# Patient Record
Sex: Female | Born: 1965
Health system: Southern US, Community
[De-identification: ages and names within clinical notes are randomized; demographics above are authoritative.]

## PROBLEM LIST (undated history)

## (undated) DIAGNOSIS — M2550 Pain in unspecified joint: Secondary | ICD-10-CM

## (undated) DIAGNOSIS — E063 Autoimmune thyroiditis: Secondary | ICD-10-CM

## (undated) DIAGNOSIS — R112 Nausea with vomiting, unspecified: Secondary | ICD-10-CM

## (undated) DIAGNOSIS — E669 Obesity, unspecified: Secondary | ICD-10-CM

## (undated) DIAGNOSIS — K5900 Constipation, unspecified: Secondary | ICD-10-CM

## (undated) DIAGNOSIS — M069 Rheumatoid arthritis, unspecified: Secondary | ICD-10-CM

## (undated) DIAGNOSIS — K5792 Diverticulitis of intestine, part unspecified, without perforation or abscess without bleeding: Secondary | ICD-10-CM

## (undated) DIAGNOSIS — E559 Vitamin D deficiency, unspecified: Secondary | ICD-10-CM

## (undated) DIAGNOSIS — B009 Herpesviral infection, unspecified: Secondary | ICD-10-CM

## (undated) DIAGNOSIS — K219 Gastro-esophageal reflux disease without esophagitis: Secondary | ICD-10-CM

## (undated) DIAGNOSIS — K859 Acute pancreatitis without necrosis or infection, unspecified: Secondary | ICD-10-CM

## (undated) DIAGNOSIS — R12 Heartburn: Secondary | ICD-10-CM

## (undated) DIAGNOSIS — R0602 Shortness of breath: Secondary | ICD-10-CM

## (undated) DIAGNOSIS — R222 Localized swelling, mass and lump, trunk: Secondary | ICD-10-CM

## (undated) DIAGNOSIS — K2970 Gastritis, unspecified, without bleeding: Secondary | ICD-10-CM

## (undated) DIAGNOSIS — R519 Headache, unspecified: Secondary | ICD-10-CM

## (undated) DIAGNOSIS — E079 Disorder of thyroid, unspecified: Secondary | ICD-10-CM

## (undated) DIAGNOSIS — K869 Disease of pancreas, unspecified: Secondary | ICD-10-CM

## (undated) DIAGNOSIS — Z9889 Other specified postprocedural states: Secondary | ICD-10-CM

## (undated) HISTORY — DX: Autoimmune thyroiditis: E06.3

## (undated) HISTORY — DX: Gastritis, unspecified, without bleeding: K29.70

## (undated) HISTORY — DX: Heartburn: R12

## (undated) HISTORY — DX: Disease of pancreas, unspecified: K86.9

## (undated) HISTORY — DX: Constipation, unspecified: K59.00

## (undated) HISTORY — DX: Disorder of thyroid, unspecified: E07.9

## (undated) HISTORY — DX: Headache, unspecified: R51.9

## (undated) HISTORY — DX: Pain in unspecified joint: M25.50

## (undated) HISTORY — PX: TYMPANOSTOMY TUBE PLACEMENT: SHX32

## (undated) HISTORY — DX: Vitamin D deficiency, unspecified: E55.9

## (undated) HISTORY — DX: Acute pancreatitis without necrosis or infection, unspecified: K85.90

## (undated) HISTORY — PX: COLONOSCOPY: SHX174

## (undated) HISTORY — DX: Gastro-esophageal reflux disease without esophagitis: K21.9

## (undated) HISTORY — DX: Shortness of breath: R06.02

## (undated) HISTORY — DX: Diverticulitis of intestine, part unspecified, without perforation or abscess without bleeding: K57.92

## (undated) HISTORY — PX: POLYPECTOMY: SHX149

## (undated) HISTORY — PX: TONSILLECTOMY AND ADENOIDECTOMY: SHX28

## (undated) HISTORY — DX: Obesity, unspecified: E66.9

---

## 2001-07-20 ENCOUNTER — Other Ambulatory Visit: Admission: RE | Admit: 2001-07-20 | Discharge: 2001-07-20 | Payer: Self-pay | Admitting: Obstetrics and Gynecology

## 2002-08-12 ENCOUNTER — Other Ambulatory Visit: Admission: RE | Admit: 2002-08-12 | Discharge: 2002-08-12 | Payer: Self-pay | Admitting: Obstetrics and Gynecology

## 2002-08-13 ENCOUNTER — Encounter: Payer: Self-pay | Admitting: Emergency Medicine

## 2002-08-13 ENCOUNTER — Emergency Department (HOSPITAL_COMMUNITY): Admission: EM | Admit: 2002-08-13 | Discharge: 2002-08-13 | Payer: Self-pay | Admitting: Emergency Medicine

## 2003-10-11 ENCOUNTER — Other Ambulatory Visit: Admission: RE | Admit: 2003-10-11 | Discharge: 2003-10-11 | Payer: Self-pay | Admitting: Obstetrics and Gynecology

## 2003-12-05 ENCOUNTER — Ambulatory Visit (HOSPITAL_COMMUNITY): Admission: RE | Admit: 2003-12-05 | Discharge: 2003-12-05 | Payer: Self-pay | Admitting: Family Medicine

## 2004-01-25 ENCOUNTER — Ambulatory Visit (HOSPITAL_COMMUNITY): Admission: RE | Admit: 2004-01-25 | Discharge: 2004-01-25 | Payer: Self-pay | Admitting: Family Medicine

## 2004-01-26 ENCOUNTER — Ambulatory Visit (HOSPITAL_COMMUNITY): Admission: RE | Admit: 2004-01-26 | Discharge: 2004-01-26 | Payer: Self-pay | Admitting: Family Medicine

## 2004-01-30 ENCOUNTER — Ambulatory Visit (HOSPITAL_COMMUNITY): Admission: RE | Admit: 2004-01-30 | Discharge: 2004-01-30 | Payer: Self-pay | Admitting: Gastroenterology

## 2004-01-31 ENCOUNTER — Ambulatory Visit (HOSPITAL_COMMUNITY): Admission: RE | Admit: 2004-01-31 | Discharge: 2004-01-31 | Payer: Self-pay | Admitting: Gastroenterology

## 2004-10-31 ENCOUNTER — Other Ambulatory Visit: Admission: RE | Admit: 2004-10-31 | Discharge: 2004-10-31 | Payer: Self-pay | Admitting: Obstetrics and Gynecology

## 2005-11-05 ENCOUNTER — Other Ambulatory Visit: Admission: RE | Admit: 2005-11-05 | Discharge: 2005-11-05 | Payer: Self-pay | Admitting: Obstetrics and Gynecology

## 2008-07-05 ENCOUNTER — Encounter: Admission: RE | Admit: 2008-07-05 | Discharge: 2008-07-05 | Payer: Self-pay | Admitting: Obstetrics and Gynecology

## 2010-08-25 ENCOUNTER — Encounter: Payer: Self-pay | Admitting: Family Medicine

## 2010-12-20 NOTE — Op Note (Signed)
NAME:  Robin Maynard, Robin Maynard                         ACCOUNT NO.:  [id number]   MEDICAL RECORD NO.:  [id number]                   PATIENT TYPE:  AMB   LOCATION:  ENDO                                 FACILITY:  [facility]   PHYSICIAN:  Graylin Shiver, M.D.                DATE OF BIRTH:  [birth year]   DATE OF PROCEDURE:  01/30/2004  DATE OF DISCHARGE:                                 OPERATIVE REPORT   PROCEDURE:  Esophagogastroduodenoscopy with biopsy for CLO test.   INDICATIONS FOR PROCEDURE:  Upper abdominal pain.   CONSENT:  Informed consent was obtained after explanation of the risks of  bleeding, infection, and perforation.   PREMEDICATION:  Fentanyl 62.5 mcg intravenously, Versed 5 mg intravenously.   DESCRIPTION OF PROCEDURE:  With the patient in the left lateral decubitus  position, the Olympus gastroscope was inserted into the oropharynx and  passed into the esophagus.  It was advanced down the esophagus then into the  stomach and duodenum.  The second portion involved in the duodenum was  normal.  The stomach showed a diffuse erythematous appearance to the mucosa  consistent with gastritis.  No ulcers or erosions were seen.  No lesions  were seen in the fundus or cardia.  Biopsies were obtained from the distal  stomach for CLO test.  The esophagus looked normal.  The esophagogastric  junction was at 35 cm.  She tolerated the procedure well without  complications.   IMPRESSION:  Gastritis.   PLAN:  The CLO test will be checked.                                               Graylin Shiver, M.D.    Germain Osgood  D:  01/30/2004  T:  01/30/2004  Job:  [id number]   cc:   Meredith Staggers, M.D.  [address], [address]  [place]  [place] [address]  Fax: [phone]

## 2013-08-11 ENCOUNTER — Encounter (INDEPENDENT_AMBULATORY_CARE_PROVIDER_SITE_OTHER): Payer: Self-pay

## 2013-08-11 ENCOUNTER — Encounter (INDEPENDENT_AMBULATORY_CARE_PROVIDER_SITE_OTHER): Payer: Self-pay | Admitting: General Surgery

## 2013-08-11 ENCOUNTER — Ambulatory Visit (INDEPENDENT_AMBULATORY_CARE_PROVIDER_SITE_OTHER): Payer: Commercial Managed Care - PPO | Admitting: General Surgery

## 2013-08-11 VITALS — BP 112/80 | Temp 97.4°F | Ht 64.0 in | Wt 176.6 lb

## 2013-08-11 DIAGNOSIS — R229 Localized swelling, mass and lump, unspecified: Secondary | ICD-10-CM

## 2013-08-11 DIAGNOSIS — R2241 Localized swelling, mass and lump, right lower limb: Secondary | ICD-10-CM | POA: Insufficient documentation

## 2013-08-11 DIAGNOSIS — R222 Localized swelling, mass and lump, trunk: Secondary | ICD-10-CM | POA: Insufficient documentation

## 2013-08-11 NOTE — Progress Notes (Signed)
Patient ID: Robin Maynard, female   DOB: 08-Nov-1965, 48 y.o.   MRN: [id number]  No chief complaint on file.   HPI Robin Maynard is a 48 y.o. female.   HPI  She is self-referred.  She noticed a soft tissue lump in her right lower leg as well as on her right back. Recently, the one in the right back swelled up and was slightly red but it has significantly gone down now. She is not aware of any specific trauma to the areas. The areas are currently asymptomatic.  No past medical history on file.  No past surgical history on file.  Family History  Problem Relation Age of Onset  . Gastric cancer Father     Social History History  Substance Use Topics  . Smoking status: Never Smoker   . Smokeless tobacco: Not on file  . Alcohol Use: No    Allergies no known allergies  Current Outpatient Prescriptions  Medication Sig Dispense Refill  . esomeprazole (NEXIUM) 20 MG capsule Take 20 mg by mouth daily at 12 noon.       No current facility-administered medications for this visit.    Review of Systems Review of Systems  Constitutional: Negative.   Neurological: Positive for headaches.    Blood pressure 112/80, temperature 97.4 F (36.3 C), height 5\' 4"  (1.626 m), weight 176 lb 9.6 oz (80.105 kg).  Physical Exam Physical Exam  Constitutional: She appears well-developed and well-nourished. No distress.  Musculoskeletal:  Right leg-8 mm subcutaneous smooth mobile mass in the distal pretibial area. No erythema.  No moles on the foot or pretibial area.  Back-8 mm palpable subcutaneous mass that is mobile. No erythema.    Data Reviewed none  Assessment    Small subcutaneous masses on the right leg and back. The most likely to clinical diagnosis would be epidermoid cyst. I did not see an indication to remove them at this time.     Plan    If the areas become symptomatic or larger, I asked her to call us back and we could discuss removal.        Lima Chillemi  J 08/11/2013, 4:24 PM

## 2013-08-11 NOTE — Patient Instructions (Signed)
The lump on your right lower leg and right back measure approximately 8 mm in size.  If these become larger or become painful, please let us know and we can discuss removal.

## 2014-11-15 ENCOUNTER — Ambulatory Visit (HOSPITAL_COMMUNITY)
Admission: RE | Admit: 2014-11-15 | Discharge: 2014-11-15 | Disposition: A | Discharge status: Home / Self Care | Payer: 59 | Source: Ambulatory Visit | Attending: Vascular Surgery | Admitting: Vascular Surgery

## 2014-11-15 ENCOUNTER — Other Ambulatory Visit: Payer: Self-pay | Admitting: Vascular Surgery

## 2014-11-15 DIAGNOSIS — M7989 Other specified soft tissue disorders: Secondary | ICD-10-CM

## 2014-11-15 NOTE — Progress Notes (Signed)
*  Preliminary Results* Right lower extremity venous duplex completed. Right lower extremity is negative for deep vein thrombosis. There is no evidence of right Baker's cyst.  11/15/2014 3:06 PM  Maudry Mayhew, RVT, RDCS, RDMS

## 2014-11-23 ENCOUNTER — Other Ambulatory Visit: Payer: Self-pay | Admitting: Obstetrics and Gynecology

## 2014-11-23 DIAGNOSIS — R928 Other abnormal and inconclusive findings on diagnostic imaging of breast: Secondary | ICD-10-CM

## 2014-11-28 ENCOUNTER — Ambulatory Visit
Admission: RE | Admit: 2014-11-28 | Discharge: 2014-11-28 | Disposition: A | Discharge status: Home / Self Care | Payer: 59 | Source: Ambulatory Visit | Attending: Obstetrics and Gynecology | Admitting: Obstetrics and Gynecology

## 2014-11-28 DIAGNOSIS — R928 Other abnormal and inconclusive findings on diagnostic imaging of breast: Secondary | ICD-10-CM

## 2015-01-26 ENCOUNTER — Other Ambulatory Visit: Payer: Self-pay | Admitting: *Deleted

## 2015-01-26 ENCOUNTER — Encounter: Payer: Self-pay | Admitting: Vascular Surgery

## 2015-01-26 DIAGNOSIS — M7989 Other specified soft tissue disorders: Secondary | ICD-10-CM

## 2015-03-12 ENCOUNTER — Encounter: Payer: Self-pay | Admitting: Vascular Surgery

## 2015-03-13 ENCOUNTER — Encounter (INDEPENDENT_AMBULATORY_CARE_PROVIDER_SITE_OTHER): Payer: Self-pay

## 2015-03-13 ENCOUNTER — Encounter: Payer: Self-pay | Admitting: Vascular Surgery

## 2015-03-13 ENCOUNTER — Ambulatory Visit (INDEPENDENT_AMBULATORY_CARE_PROVIDER_SITE_OTHER): Payer: 59 | Admitting: Vascular Surgery

## 2015-03-13 ENCOUNTER — Ambulatory Visit (HOSPITAL_COMMUNITY)
Admission: RE | Admit: 2015-03-13 | Discharge: 2015-03-13 | Disposition: A | Discharge status: Home / Self Care | Payer: 59 | Source: Ambulatory Visit | Attending: Vascular Surgery | Admitting: Vascular Surgery

## 2015-03-13 VITALS — BP 118/73 | HR 72 | Ht 64.0 in | Wt 176.0 lb

## 2015-03-13 DIAGNOSIS — M7989 Other specified soft tissue disorders: Secondary | ICD-10-CM | POA: Insufficient documentation

## 2015-03-13 DIAGNOSIS — I8391 Asymptomatic varicose veins of right lower extremity: Secondary | ICD-10-CM | POA: Diagnosis not present

## 2015-03-13 NOTE — Progress Notes (Signed)
Patient name: Robin Maynard MRN: [id number] DOB: Nov 29, 1965 Sex: female   Referred by: Helane Rima  Reason for referral:  Chief Complaint  Patient presents with  . New Evaluation    eval Rt LE swelling w/ occasional pain     HISTORY OF PRESENT ILLNESS:  patient presents today for evaluation of right leg swelling and aching discomfort. 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 She reports that after prolonged  Standing she can have swelling. She has tried compression garments in the past with no relief. No history of DVT and no history of varicosities. She does have scattered reticular veins on both lower extremities.  History reviewed. No pertinent past medical history.  History reviewed. No pertinent past surgical history.  History   Social History  . Marital Status: Married    Spouse Name: N/A  . Number of Children: N/A  . Years of Education: N/A   Occupational History  . Not on file.   Social History Main Topics  . Smoking status: Never Smoker   . Smokeless tobacco: Not on file  . Alcohol Use: No  . Drug Use: No  . Sexual Activity: Not on file   Other Topics Concern  . Not on file   Social History Narrative    Family History  Problem Relation Age of Onset  . Gastric cancer Father   . Cancer Father   . Diabetes Father   . Heart disease Father   . Hypertension Father   . Deep vein thrombosis Mother     Allergies as of 03/13/2015  . (No Known Allergies)    Current Outpatient Prescriptions on File Prior to Visit  Medication Sig Dispense Refill  . esomeprazole (NEXIUM) 20 MG capsule Take 20 mg by mouth daily at 12 noon.     No current facility-administered medications on file prior to visit.     REVIEW OF SYSTEMS:  Positives indicated with an "X"  CARDIOVASCULAR:  [ ]  chest pain   [ ]  chest pressure   [ ]  palpitations   [ ]  orthopnea   [ ]  dyspnea on exertion   [ ]  claudication   [ ]  rest pain   [ ]  DVT   [ ]  phlebitis PULMONARY:   [ ]   productive cough   [ ]  asthma   [ ]  wheezing NEUROLOGIC:   [ ]  weakness  [ ]  paresthesias  [ ]  aphasia  [ ]  amaurosis  [ ]  dizziness HEMATOLOGIC:   [ ]  bleeding problems   [ ]  clotting disorders MUSCULOSKELETAL:  [ ]  joint pain   [ ]  joint swelling GASTROINTESTINAL: [ ]   blood in stool  [ ]   hematemesis GENITOURINARY:  [ ]   dysuria  [ ]   hematuria PSYCHIATRIC:  [ ]  history of major depression INTEGUMENTARY:  [ ]  rashes  [ ]  ulcers CONSTITUTIONAL:  [ ]  fever   [ ]  chills  PHYSICAL EXAMINATION:  General: The patient is a well-nourished female, in no acute distress. Vital signs are BP 118/73 mmHg  Pulse 72  Ht 5\' 4"  (1.626 m)  Wt 176 lb (79.833 kg)  BMI 30.20 kg/m2  SpO2 99% Pulmonary: There is a good air exchange  Musculoskeletal: There are no major deformities.  There is no significant extremity pain. Neurologic: No focal weakness or paresthesias are detected, Skin: There are no ulcer or rashes noted. Psychiatric: The patient has normal affect. Cardiovascular:  Palpable dorsalis pedis pulses bilaterally  No significant swelling  and no changes of venous hypertension noted. She does have scattered blooming reticular eye which are apparent over both lower extremities   V[facility] Vascular Lab Studies:  Ordered and Independently Reviewed  Right leg venous duplex today was reviewed with the patient. I did reimage her  Saphenous vein with SonoSite ultrasound myself. This does show very mild enlargement in her right great saphenous vein and no significant reflux throughout. Her deep vein is notable for some reflux in her common femoral vein only but otherwise no reflux  Impression and Plan:   I discussed these findings with the patient. Explained that she does not have any evidence of correctable venous hypertension and that the deep venous reflux in her common femoral vein and is relatively insignificant and should not predispose her to any risk of chronic venous hypertension. I did explain that  the compression may give her some benefit but would recommend this only for symptomatic relief. If she is not feeling change with this would not. She was reassured this discussion will see [place] again on as-needed basis. I did explain treatment for the cosmetic reticular veins if she was interested and gave her contact information with Thea Silversmith, RN if she wishes to proceed    Erinn Mendosa, Sherren Mocha [facility] Office: [phone]

## 2015-05-14 ENCOUNTER — Other Ambulatory Visit: Payer: Self-pay | Admitting: Obstetrics and Gynecology

## 2015-05-14 DIAGNOSIS — N6489 Other specified disorders of breast: Secondary | ICD-10-CM

## 2015-06-12 ENCOUNTER — Inpatient Hospital Stay: Admission: RE | Admit: 2015-06-12 | Payer: 59 | Source: Ambulatory Visit

## 2015-08-02 ENCOUNTER — Ambulatory Visit
Admission: RE | Admit: 2015-08-02 | Discharge: 2015-08-02 | Disposition: A | Discharge status: Home / Self Care | Payer: 59 | Source: Ambulatory Visit | Attending: Obstetrics and Gynecology | Admitting: Obstetrics and Gynecology

## 2015-08-02 DIAGNOSIS — N6489 Other specified disorders of breast: Secondary | ICD-10-CM

## 2015-08-07 MED FILL — VALACYCLOVIR HCL 500 MG TAB: 500 | 90 days supply | Qty: 90 | Fill #2

## 2015-09-05 DIAGNOSIS — R319 Hematuria, unspecified: Secondary | ICD-10-CM | POA: Diagnosis not present

## 2015-09-05 DIAGNOSIS — R928 Other abnormal and inconclusive findings on diagnostic imaging of breast: Secondary | ICD-10-CM | POA: Diagnosis not present

## 2015-09-25 DIAGNOSIS — H524 Presbyopia: Secondary | ICD-10-CM | POA: Diagnosis not present

## 2015-09-25 DIAGNOSIS — H5213 Myopia, bilateral: Secondary | ICD-10-CM | POA: Diagnosis not present

## 2015-09-26 DIAGNOSIS — R600 Localized edema: Secondary | ICD-10-CM | POA: Diagnosis not present

## 2015-09-26 DIAGNOSIS — M79604 Pain in right leg: Secondary | ICD-10-CM | POA: Diagnosis not present

## 2015-10-16 DIAGNOSIS — M79604 Pain in right leg: Secondary | ICD-10-CM | POA: Diagnosis not present

## 2015-11-30 MED FILL — VALACYCLOVIR HCL 500 MG TAB: 500 | 30 days supply | Qty: 30 | Fill #1

## 2016-02-04 ENCOUNTER — Other Ambulatory Visit: Payer: Self-pay | Admitting: Obstetrics and Gynecology

## 2016-02-04 DIAGNOSIS — N63 Unspecified lump in unspecified breast: Secondary | ICD-10-CM

## 2016-02-21 ENCOUNTER — Other Ambulatory Visit: Payer: 59

## 2016-02-21 MED FILL — VALACYCLOVIR HCL 500 MG TAB: 500 | 30 days supply | Qty: 30 | Fill #0

## 2016-03-18 DIAGNOSIS — Z6830 Body mass index (BMI) 30.0-30.9, adult: Secondary | ICD-10-CM | POA: Diagnosis not present

## 2016-03-18 DIAGNOSIS — Z01419 Encounter for gynecological examination (general) (routine) without abnormal findings: Secondary | ICD-10-CM | POA: Diagnosis not present

## 2016-04-18 ENCOUNTER — Ambulatory Visit
Admission: RE | Admit: 2016-04-18 | Discharge: 2016-04-18 | Disposition: A | Discharge status: Home / Self Care | Payer: 59 | Source: Ambulatory Visit | Attending: Obstetrics and Gynecology | Admitting: Obstetrics and Gynecology

## 2016-04-18 DIAGNOSIS — N63 Unspecified lump in unspecified breast: Secondary | ICD-10-CM

## 2016-04-18 DIAGNOSIS — R928 Other abnormal and inconclusive findings on diagnostic imaging of breast: Secondary | ICD-10-CM | POA: Diagnosis not present

## 2016-05-22 DIAGNOSIS — N924 Excessive bleeding in the premenopausal period: Secondary | ICD-10-CM | POA: Diagnosis not present

## 2016-05-22 DIAGNOSIS — N926 Irregular menstruation, unspecified: Secondary | ICD-10-CM | POA: Diagnosis not present

## 2016-05-22 DIAGNOSIS — N920 Excessive and frequent menstruation with regular cycle: Secondary | ICD-10-CM | POA: Diagnosis not present

## 2016-05-27 DIAGNOSIS — H5711 Ocular pain, right eye: Secondary | ICD-10-CM | POA: Diagnosis not present

## 2016-05-27 DIAGNOSIS — H2513 Age-related nuclear cataract, bilateral: Secondary | ICD-10-CM | POA: Diagnosis not present

## 2016-05-27 DIAGNOSIS — H35412 Lattice degeneration of retina, left eye: Secondary | ICD-10-CM | POA: Diagnosis not present

## 2016-05-27 MED FILL — VALACYCLOVIR HCL 500 MG TAB: 500 | 30 days supply | Qty: 30 | Fill #1

## 2016-06-05 MED FILL — PROGESTERONE 100 MG CAPSULE: 100 | 10 days supply | Qty: 10 | Fill #0

## 2016-06-09 MED FILL — valACYclovir HCL 1 GM TABS: 1 | 7 days supply | Qty: 21 | Fill #0

## 2016-08-12 MED FILL — VALACYCLOVIR HCL 500 MG TAB: 500 | 30 days supply | Qty: 30 | Fill #0

## 2016-09-22 MED FILL — VALACYCLOVIR HCL 500 MG TAB: 500 | 30 days supply | Qty: 30 | Fill #0

## 2016-09-24 ENCOUNTER — Encounter (HOSPITAL_COMMUNITY): Payer: Self-pay | Admitting: Emergency Medicine

## 2016-09-24 ENCOUNTER — Ambulatory Visit (HOSPITAL_COMMUNITY)
Admission: EM | Admit: 2016-09-24 | Discharge: 2016-09-24 | Disposition: A | Discharge status: Home / Self Care | Payer: 59 | Source: Home / Self Care

## 2016-09-24 DIAGNOSIS — J111 Influenza due to unidentified influenza virus with other respiratory manifestations: Secondary | ICD-10-CM

## 2016-09-24 MED ORDER — OSELTAMIVIR PHOSPHATE 75 MG PO CAPS: 75.0000 mg | ORAL_CAPSULE | Freq: Two times a day (BID) | ORAL | 0 refills | Status: DC

## 2016-09-24 MED ORDER — ONDANSETRON HCL 4 MG PO TABS: 4.0000 mg | ORAL_TABLET | Freq: Three times a day (TID) | ORAL | 0 refills | Status: DC | PRN

## 2016-09-24 MED ORDER — BENZONATATE 100 MG PO CAPS: 100.0000 mg | ORAL_CAPSULE | Freq: Three times a day (TID) | ORAL | 0 refills | Status: DC | PRN

## 2016-09-24 MED FILL — BENZONATATE 100 MG CAP: 100 | 7 days supply | Qty: 21 | Fill #0

## 2016-09-24 MED FILL — OSELTAMIVIR PHOS 75 MG CAP: 75 | 5 days supply | Qty: 10 | Fill #0

## 2016-09-24 MED FILL — ONDANSETRON HCL 4 MG TABLET: 4 | 4 days supply | Qty: 12 | Fill #0

## 2016-09-24 NOTE — ED Triage Notes (Signed)
Pt has been suffering from a sore throat, cough, headache, abdominal pain and dizziness since Monday.  Pt reports a fever of 101 at home.

## 2016-09-24 NOTE — Discharge Instructions (Signed)
Start Tamiflu 75mg  twice a day as directed. Take Tessalon cough pills 1 every 8 hours as needed. May take Zofran 4mg  every 8 hours as needed for nausea. Take Tylenol 1000mg  every 8 hours as needed for headache and body aches. Increase fluid intake to maintain hydration. Follow-up with your primary care provider in 2 days if not improving.

## 2016-09-24 NOTE — ED Provider Notes (Signed)
CSN: [id number]     Arrival date & time 09/24/16  0957 History   First MD Initiated Contact with Patient 09/24/16 1019     Chief Complaint  Patient presents with  . flu like symptoms   (Consider location/radiation/quality/duration/timing/severity/associated sxs/prior Treatment) 51 year old female presents with fever of 101 that started 2 days ago. Now experiencing more sore throat, cough, chest congestion, headache, body aches, nausea and epigastric abdominal pain. Has taken Ibuprofen and 2 doses of Bactrim with no relief. Unable to take a flu vaccine due to previous reaction. Has history of GERD and takes Nexium daily. No other chronic health issues.    The history is provided by the patient.    History reviewed. No pertinent past medical history. History reviewed. No pertinent surgical history. Family History  Problem Relation Age of Onset  . Gastric cancer Father   . Cancer Father   . Diabetes Father   . Heart disease Father   . Hypertension Father   . Deep vein thrombosis Mother    Social History  Substance Use Topics  . Smoking status: Never Smoker  . Smokeless tobacco: Never Used  . Alcohol use No   OB History    No data available     Review of Systems  Constitutional: Positive for appetite change, chills, fatigue and fever.  HENT: Positive for ear pain, postnasal drip and sore throat. Negative for rhinorrhea, sinus pain, sinus pressure and sneezing.   Eyes: Negative for discharge.  Respiratory: Positive for cough. Negative for chest tightness, shortness of breath and wheezing.   Cardiovascular: Negative for chest pain.  Gastrointestinal: Positive for abdominal pain and nausea. Negative for diarrhea and vomiting.  Musculoskeletal: Positive for arthralgias and myalgias. Negative for back pain, neck pain and neck stiffness.  Skin: Negative for rash.  Neurological: Positive for headaches. Negative for dizziness, syncope and light-headedness.  Hematological: Negative  for adenopathy.    Allergies  Patient has no known allergies.  Home Medications   Prior to Admission medications   Medication Sig Start Date End Date Taking? Authorizing Provider  esomeprazole (NEXIUM) 20 MG capsule Take 20 mg by mouth daily at 12 noon.   Yes Historical Provider, MD  valACYclovir (VALTREX) 500 MG tablet Take 500 mg by mouth as needed.   Yes Historical Provider, MD  benzonatate (TESSALON) 100 MG capsule Take 1 capsule (100 mg total) by mouth 3 (three) times daily as needed for cough. 09/24/16   Katy Apo, NP  ondansetron (ZOFRAN) 4 MG tablet Take 1 tablet (4 mg total) by mouth every 8 (eight) hours as needed for nausea. 09/24/16   Katy Apo, NP  oseltamivir (TAMIFLU) 75 MG capsule Take 1 capsule (75 mg total) by mouth every 12 (twelve) hours. 09/24/16 09/29/16  Katy Apo, NP   Meds Ordered and Administered this Visit  Medications - No data to display  BP 103/63 (BP Location: Right Arm)   Pulse 104   Temp 99 F (37.2 C) (Oral)   SpO2 95%  No data found.   Physical Exam  Constitutional: She is oriented to person, place, and time. She appears well-developed and well-nourished. She appears ill. No distress.  HENT:  Head: Normocephalic and atraumatic.  Right Ear: Hearing, tympanic membrane, external ear and ear canal normal.  Left Ear: Hearing, tympanic membrane, external ear and ear canal normal.  Nose: Nose normal. Right sinus exhibits no maxillary sinus tenderness and no frontal sinus tenderness. Left sinus exhibits no maxillary sinus tenderness  and no frontal sinus tenderness.  Mouth/Throat: Uvula is midline and mucous membranes are normal. Posterior oropharyngeal erythema present.  Neck: Normal range of motion. Neck supple.  Cardiovascular: Regular rhythm and normal heart sounds.  Tachycardia present.   Pulmonary/Chest: Effort normal. She has decreased breath sounds in the right upper field, the right lower field, the left upper field and the left  lower field. She has no wheezes. She has rhonchi in the right upper field and the left upper field. She has no rales.  Abdominal: Soft. Bowel sounds are normal. There is tenderness in the epigastric area.  Lymphadenopathy:       Head (left side): Occipital adenopathy present.    She has cervical adenopathy.  Neurological: She is alert and oriented to person, place, and time.  Skin: Skin is warm and dry. Capillary refill takes less than 2 seconds.  Psychiatric: She has a normal mood and affect. Her behavior is normal. Judgment and thought content normal.    Urgent Care Course     Procedures (including critical care time)  Labs Review Labs Reviewed - No data to display  Imaging Review No results found.   Visual Acuity Review  Right Eye Distance:   Left Eye Distance:   Bilateral Distance:    Right Eye Near:   Left Eye Near:    Bilateral Near:         MDM   1. Influenza    Discussed with patient that she probably has influenza. Recommend start Tamiflu 75mg  twice a day as directed. Take Tessalon cough pills 1 every 8 hours as needed. May take Zofran 4mg  every 8 hours as needed for nausea. Take Tylenol 1000mg  every 8 hours as needed for headache and body aches. Increase fluid intake to maintain hydration. Note written for work. Follow-up with your primary care provider in 2 days if not improving.      Katy Apo, NP 09/24/16 [phone]

## 2016-09-27 ENCOUNTER — Encounter (HOSPITAL_COMMUNITY): Payer: Self-pay | Admitting: Emergency Medicine

## 2016-09-27 ENCOUNTER — Emergency Department (HOSPITAL_COMMUNITY): Payer: 59

## 2016-09-27 ENCOUNTER — Inpatient Hospital Stay (HOSPITAL_COMMUNITY)
Admission: EM | Admit: 2016-09-27 | Discharge: 2016-09-30 | DRG: 392 | Disposition: A | Discharge status: Home / Self Care | Payer: 59 | Attending: Family Medicine | Admitting: Family Medicine

## 2016-09-27 DIAGNOSIS — R109 Unspecified abdominal pain: Secondary | ICD-10-CM

## 2016-09-27 DIAGNOSIS — Z79899 Other long term (current) drug therapy: Secondary | ICD-10-CM | POA: Diagnosis not present

## 2016-09-27 DIAGNOSIS — E663 Overweight: Secondary | ICD-10-CM | POA: Diagnosis not present

## 2016-09-27 DIAGNOSIS — R1013 Epigastric pain: Secondary | ICD-10-CM | POA: Diagnosis present

## 2016-09-27 DIAGNOSIS — K2970 Gastritis, unspecified, without bleeding: Secondary | ICD-10-CM | POA: Diagnosis present

## 2016-09-27 DIAGNOSIS — R112 Nausea with vomiting, unspecified: Secondary | ICD-10-CM | POA: Diagnosis not present

## 2016-09-27 DIAGNOSIS — Z800 Family history of malignant neoplasm of digestive organs: Secondary | ICD-10-CM

## 2016-09-27 DIAGNOSIS — K859 Acute pancreatitis without necrosis or infection, unspecified: Secondary | ICD-10-CM | POA: Diagnosis present

## 2016-09-27 DIAGNOSIS — Z8249 Family history of ischemic heart disease and other diseases of the circulatory system: Secondary | ICD-10-CM

## 2016-09-27 DIAGNOSIS — Z833 Family history of diabetes mellitus: Secondary | ICD-10-CM | POA: Diagnosis not present

## 2016-09-27 DIAGNOSIS — K5900 Constipation, unspecified: Secondary | ICD-10-CM | POA: Diagnosis present

## 2016-09-27 DIAGNOSIS — Z6827 Body mass index (BMI) 27.0-27.9, adult: Secondary | ICD-10-CM | POA: Diagnosis not present

## 2016-09-27 LAB — CBC WITH DIFFERENTIAL/PLATELET
BASOS ABS: 0 10*3/uL (ref 0.0–0.1)
Basophils Relative: 0 %
EOS PCT: 3 %
Eosinophils Absolute: 0.2 10*3/uL (ref 0.0–0.7)
HEMATOCRIT: 41.5 % (ref 36.0–46.0)
Hemoglobin: 14.5 g/dL (ref 12.0–15.0)
Lymphocytes Relative: 26 %
Lymphs Abs: 1.9 10*3/uL (ref 0.7–4.0)
MCH: 29.8 pg (ref 26.0–34.0)
MCHC: 34.9 g/dL (ref 30.0–36.0)
MCV: 85.2 fL (ref 78.0–100.0)
MONO ABS: 0.4 10*3/uL (ref 0.1–1.0)
MONOS PCT: 6 %
NEUTROS PCT: 65 %
Neutro Abs: 4.9 10*3/uL (ref 1.7–7.7)
PLATELETS: 210 10*3/uL (ref 150–400)
RBC: 4.87 MIL/uL (ref 3.87–5.11)
RDW: 12.7 % (ref 11.5–15.5)
WBC: 7.4 10*3/uL (ref 4.0–10.5)

## 2016-09-27 LAB — URINALYSIS, ROUTINE W REFLEX MICROSCOPIC
Bilirubin Urine: NEGATIVE
Glucose, UA: NEGATIVE mg/dL
Hgb urine dipstick: NEGATIVE
Ketones, ur: NEGATIVE mg/dL
Nitrite: NEGATIVE
PROTEIN: NEGATIVE mg/dL
SPECIFIC GRAVITY, URINE: 1.019 (ref 1.005–1.030)
pH: 6 (ref 5.0–8.0)

## 2016-09-27 LAB — COMPREHENSIVE METABOLIC PANEL
ALBUMIN: 4 g/dL (ref 3.5–5.0)
ALK PHOS: 75 U/L (ref 38–126)
ALT: 47 U/L (ref 14–54)
AST: 36 U/L (ref 15–41)
Anion gap: 9 (ref 5–15)
BUN: 17 mg/dL (ref 6–20)
CALCIUM: 9 mg/dL (ref 8.9–10.3)
CO2: 26 mmol/L (ref 22–32)
CREATININE: 0.9 mg/dL (ref 0.44–1.00)
Chloride: 103 mmol/L (ref 101–111)
GFR calc Af Amer: >60 mL/min (ref >60)
GFR calc non Af Amer: >60 mL/min (ref >60)
GLUCOSE: 111 mg/dL — AB (ref 65–99)
Potassium: 4 mmol/L (ref 3.5–5.1)
SODIUM: 138 mmol/L (ref 135–145)
Total Bilirubin: 0.6 mg/dL (ref 0.3–1.2)
Total Protein: 7.3 g/dL (ref 6.5–8.1)

## 2016-09-27 LAB — I-STAT CG4 LACTIC ACID, ED
Lactic Acid, Venous: 1.29 mmol/L (ref 0.5–1.9)
Lactic Acid, Venous: 0.99 mmol/L (ref 0.5–1.9)

## 2016-09-27 LAB — LIPASE, BLOOD: Lipase: 35 U/L (ref 11–51)

## 2016-09-27 LAB — PREGNANCY, URINE: Preg Test, Ur: NEGATIVE

## 2016-09-27 MED ORDER — ONDANSETRON HCL 4 MG PO TABS: 4.0000 mg | ORAL_TABLET | Freq: Four times a day (QID) | ORAL | Status: DC | PRN

## 2016-09-27 MED ORDER — IOPAMIDOL (ISOVUE-300) INJECTION 61%
100.0000 mL | Freq: Once | INTRAVENOUS | Status: AC | PRN
  Administered 2016-09-27: 100 mL via INTRAVENOUS

## 2016-09-27 MED ORDER — ONDANSETRON 4 MG PO TBDP
4.0000 mg | ORAL_TABLET | Freq: Once | ORAL | Status: AC
  Administered 2016-09-27: 4 mg via ORAL
  Filled 2016-09-27: qty 1

## 2016-09-27 MED ORDER — POLYETHYLENE GLYCOL 3350 17 G PO PACK: 17.0000 g | PACK | Freq: Once | ORAL | Status: DC

## 2016-09-27 MED ORDER — HYDROMORPHONE HCL 1 MG/ML IJ SOLN
1.0000 mg | Freq: Once | INTRAMUSCULAR | Status: AC
  Administered 2016-09-27: 0.5 mg via INTRAVENOUS
  Filled 2016-09-27: qty 1

## 2016-09-27 MED ORDER — FENTANYL CITRATE (PF) 100 MCG/2ML IJ SOLN
50.0000 ug | Freq: Once | INTRAMUSCULAR | Status: AC
  Administered 2016-09-27: 50 ug via INTRAVENOUS
  Filled 2016-09-27: qty 2

## 2016-09-27 MED ORDER — ONDANSETRON HCL 4 MG/2ML IJ SOLN
4.0000 mg | Freq: Four times a day (QID) | INTRAMUSCULAR | Status: DC | PRN
  Administered 2016-09-27 – 2016-09-28 (×5): 4 mg via INTRAVENOUS
  Filled 2016-09-27 (×5): qty 2

## 2016-09-27 MED ORDER — PROMETHAZINE HCL 25 MG/ML IJ SOLN
25.0000 mg | Freq: Once | INTRAMUSCULAR | Status: DC
  Filled 2016-09-27: qty 1

## 2016-09-27 MED ORDER — SODIUM CHLORIDE 0.9 % IV BOLUS (SEPSIS)
1000.0000 mL | Freq: Once | INTRAVENOUS | Status: AC
  Administered 2016-09-27: 1000 mL via INTRAVENOUS

## 2016-09-27 MED ORDER — DEXTROSE-NACL 5-0.9 % IV SOLN
INTRAVENOUS | Status: DC
Start: 2016-09-27 — End: 2016-09-29
  Administered 2016-09-27 – 2016-09-29 (×4): via INTRAVENOUS

## 2016-09-27 MED ORDER — PANTOPRAZOLE SODIUM 40 MG IV SOLR
40.0000 mg | Freq: Once | INTRAVENOUS | Status: AC
  Administered 2016-09-27: 40 mg via INTRAVENOUS
  Filled 2016-09-27: qty 40

## 2016-09-27 MED ORDER — IOPAMIDOL (ISOVUE-300) INJECTION 61%
INTRAVENOUS | Status: AC
  Filled 2016-09-27: qty 100

## 2016-09-27 MED ORDER — ONDANSETRON HCL 4 MG/2ML IJ SOLN
4.0000 mg | Freq: Once | INTRAMUSCULAR | Status: AC
  Administered 2016-09-27: 4 mg via INTRAVENOUS
  Filled 2016-09-27: qty 2

## 2016-09-27 MED ORDER — OXYCODONE-ACETAMINOPHEN 5-325 MG PO TABS
1.0000 | ORAL_TABLET | Freq: Once | ORAL | Status: AC
  Administered 2016-09-27: 1 via ORAL
  Filled 2016-09-27: qty 1

## 2016-09-27 MED ORDER — MORPHINE SULFATE (PF) 4 MG/ML IV SOLN
2.0000 mg | INTRAVENOUS | Status: DC | PRN
  Administered 2016-09-27 – 2016-09-28 (×3): 2 mg via INTRAVENOUS
  Filled 2016-09-27 (×3): qty 1

## 2016-09-27 MED ORDER — GI COCKTAIL ~~LOC~~
30.0000 mL | Freq: Once | ORAL | Status: AC
  Administered 2016-09-27: 30 mL via ORAL
  Filled 2016-09-27: qty 30

## 2016-09-27 NOTE — ED Provider Notes (Signed)
Patient signed out to me at change of shift by [facility], PA-C.  Pt with epigastric pain.  Takes Nexium.  Plan for CT abd/pelvis.   If negative, pt d/c home with tx for gastritis vs PUD, protonix Rx, refer to GI.   7:09 AM Pt feeling much better, pain controlled, no nausea.  Will PO trial.   10:18 AM Miserable after PO trial despite PO pain and nausea medications. Pain increased.  Pain is epigastric, radiates through to the back.  Tender to palpation without guarding or rebound.    10:35 AM Discussed pt with Dr Maryland Pink who will see patient.    Physical Exam  BP 124/89 (BP Location: Right Arm)   Pulse 88   Temp 97.8 F (36.6 C) (Oral)   Resp 18   Ht 5\' 4"  (1.626 m)   Wt 72.6 kg   SpO2 99%   BMI 27.46 kg/m   Physical Exam  Constitutional: She appears well-developed and well-nourished. No distress.  HENT:  Head: Normocephalic and atraumatic.  Neck: Neck supple.  Cardiovascular: Normal rate and regular rhythm.   Pulmonary/Chest: Effort normal and breath sounds normal. No respiratory distress. She has no wheezes. She has no rales.  Abdominal: Soft. She exhibits no distension. There is tenderness (very mild, epigastric). There is no rebound and no guarding.  Neurological: She is alert.  Skin: She is not diaphoretic.  Nursing note and vitals reviewed.   ED Course  Procedures  MDM Pt with clinical concern for pancreatitis.  Labs unremarkable.  Ct with findings suggestive of pancreatitis.  Discussed with Dr Maryland Pink, [facility], who saw the patient and agrees with admission, treatment for pancreatitis.        Clayton Bibles, PA-C 09/27/16 [place], MD 09/27/16 [phone]

## 2016-09-27 NOTE — ED Provider Notes (Signed)
[facility] DEPT Provider Note   CSN: [id number] Arrival date & time: 09/27/16  0346     History   Chief Complaint Chief Complaint  Patient presents with  . Abdominal Pain    HPI Robin Maynard is a 51 y.o. female with No major medical history presents to the Emergency Department complaining of gradual, initially intermittent but now constant epigastric abdominal pain onset 4 days ago. Patient reports the pain currently is a 9/10 and described as sharp and stabbing.  She reports a history of gastritis morning and 15 years ago which felt similar. She reports nausea without vomiting. She denies fevers or chills in the last 24 hours. Patient reports she was evaluated at urgent care 3 days ago for influenza-like illness. She reports that he must flu made her nausea worse and she had several episodes of vomiting after taking the medication. She reports that her congestion, sore throat, cough and muscle aches have largely resolved but the epigastric abdominal pain has worsened significantly. Patient reports that she took several doses of ibuprofen at night prior to bed last week but does not regularly take NSAIDs. She denies alcohol usage, Goody powders. She denies a history of peptic ulcer disease. Currently nothing seems to make the symptoms better or worse. No treatments prior to arrival. Patient denies chest pain, shortness of breath, dysuria, hematuria, syncope.  The history is provided by the patient, medical records and the spouse. No language interpreter was used.    History reviewed. No pertinent past medical history.  Patient Active Problem List   Diagnosis Date Noted  . Mass of right lower leg 08/11/2013  . Mass on back 08/11/2013    History reviewed. No pertinent surgical history.  OB History    No data available       Home Medications    Prior to Admission medications   Medication Sig Start Date End Date Taking? Authorizing Provider  esomeprazole (NEXIUM) 20 MG  capsule Take 40 mg by mouth daily at 12 noon.    Yes Historical Provider, MD  sulfamethoxazole-trimethoprim (BACTRIM DS,SEPTRA DS) 800-160 MG tablet Take 1 tablet by mouth 2 (two) times daily.   Yes Historical Provider, MD  valACYclovir (VALTREX) 500 MG tablet Take 500 mg by mouth daily as needed (breakout).    Yes Historical Provider, MD  benzonatate (TESSALON) 100 MG capsule Take 1 capsule (100 mg total) by mouth 3 (three) times daily as needed for cough. Patient not taking: Reported on 09/27/2016 09/24/16   Katy Apo, NP  ondansetron (ZOFRAN) 4 MG tablet Take 1 tablet (4 mg total) by mouth every 8 (eight) hours as needed for nausea. Patient not taking: Reported on 09/27/2016 09/24/16   Katy Apo, NP  oseltamivir (TAMIFLU) 75 MG capsule Take 1 capsule (75 mg total) by mouth every 12 (twelve) hours. Patient not taking: Reported on 09/27/2016 09/24/16 09/29/16  Katy Apo, NP    Family History Family History  Problem Relation Age of Onset  . Gastric cancer Father   . Cancer Father   . Diabetes Father   . Heart disease Father   . Hypertension Father   . Deep vein thrombosis Mother     Social History Social History  Substance Use Topics  . Smoking status: Never Smoker  . Smokeless tobacco: Never Used  . Alcohol use No     Allergies   Patient has no known allergies.   Review of Systems Review of Systems  Gastrointestinal: Positive for abdominal pain and  nausea.  All other systems reviewed and are negative.    Physical Exam Updated Vital Signs BP 124/89 (BP Location: Right Arm)   Pulse 88   Temp 97.8 F (36.6 C) (Oral)   Resp 18   Ht 5\' 4"  (1.626 m)   Wt 72.6 kg   SpO2 99%   BMI 27.46 kg/m   Physical Exam  Constitutional: She appears well-developed and well-nourished. She appears distressed.  Awake, alert, nontoxic appearance Patient appears distressed, rocking in the bed in pain  HENT:  Head: Normocephalic and atraumatic.  Mouth/Throat: Oropharynx  is clear and moist. No oropharyngeal exudate.  Eyes: Conjunctivae are normal. No scleral icterus.  Neck: Normal range of motion. Neck supple.  Cardiovascular: Normal rate, regular rhythm and intact distal pulses.   Pulmonary/Chest: Effort normal and breath sounds normal. No respiratory distress. She has no wheezes.  Equal chest expansion  Abdominal: Soft. Bowel sounds are normal. She exhibits no mass. There is no splenomegaly or hepatomegaly. There is tenderness in the epigastric area. There is guarding. There is no rebound.  Abdomen is soft with guarding only in the epigastrium  Musculoskeletal: Normal range of motion. She exhibits no edema.  Neurological: She is alert.  Speech is clear and goal oriented Moves extremities without ataxia  Skin: Skin is warm and dry. She is not diaphoretic.  Psychiatric: She has a normal mood and affect.  Nursing note and vitals reviewed.    ED Treatments / Results  Labs (all labs ordered are listed, but only abnormal results are displayed) Labs Reviewed  COMPREHENSIVE METABOLIC PANEL - Abnormal; Notable for the following:       Result Value   Glucose, Bld 111 (*)    All other components within normal limits  URINALYSIS, ROUTINE W REFLEX MICROSCOPIC - Abnormal; Notable for the following:    Leukocytes, UA TRACE (*)    Bacteria, UA RARE (*)    Squamous Epithelial / LPF 0-5 (*)    All other components within normal limits  LIPASE, BLOOD  CBC WITH DIFFERENTIAL/PLATELET  PREGNANCY, URINE  I-STAT CG4 LACTIC ACID, ED     Procedures Procedures (including critical care time)  Medications Ordered in ED Medications  iopamidol (ISOVUE-300) 61 % injection (not administered)  pantoprazole (PROTONIX) injection 40 mg (not administered)  fentaNYL (SUBLIMAZE) injection 50 mcg (not administered)  fentaNYL (SUBLIMAZE) injection 50 mcg (50 mcg Intravenous Given 09/27/16 0453)  ondansetron (ZOFRAN) injection 4 mg (4 mg Intravenous Given 09/27/16 0451)    iopamidol (ISOVUE-300) 61 % injection 100 mL (100 mLs Intravenous Contrast Given 09/27/16 0536)     Initial Impression / Assessment and Plan / ED Course  I have reviewed the triage vital signs and the nursing notes.  Pertinent labs & imaging results that were available during my care of the patient were reviewed by me and considered in my medical decision making (see chart for details).  Clinical Course as of Sep 27 656  Sat Sep 27, 2016  0436 Pt with 7/10 pain at this time.  Labs reassuring.  Discussed labs with patient and family.    [HM]    Clinical Course User Index [HM] Jarrett Soho Chandlar Staebell, PA-C    Pt with Worsening epigastric abdominal pain.  Labs are reassuring. No history of pancreatitis. Labs have been discussed with patient and family. CT scan continues to pend.    6:35 AM At shift change, care transferred to [facility], PA-C.  CT scan pending.  If neg, pt may be treated for PUD  and referred to GI.     Final Clinical Impressions(s) / ED Diagnoses   Final diagnoses:  Epigastric pain    New Prescriptions New Prescriptions   No medications on file     Abigail Butts, PA-C 09/27/16 [place], MD 09/27/16 [phone]

## 2016-09-27 NOTE — ED Triage Notes (Signed)
Patient complaining of upper mid abdominal pain. Pain started on Tuesday. Patient states the pain feels like it goes straight to her . Her back and abdomen is pain. Patient is nauseated.

## 2016-09-27 NOTE — H&P (Addendum)
History and Physical  [name] ORDERS [id number] DOB: [birth year] DOA: 09/27/2016  Referring physician: Clayton Bibles, ER PA  PCP: Orpah Melter, MD  Outpatient Specialists: None Patient coming from: Home & is able to ambulate   without assistance Chief Complaint: abdominal pain   HPI: Robin Maynard is a 51 y.o. female with no past medical history who was in her usual state of health when she started having upper respiratory symptoms including congestion and cough starting Monday 2/19. Patient had an old prescription of Bactrim available so she started taking that. Her respiratory symptoms continued and then on Wednesday, 2/21, patient started experiencing severe 10/10 midepigastric abdominal pain which she described as radiating into her back. She stated that the pain did not radiate anywhere else. She became concerned so she went to the urgent care for evaluation. There, she was told that she may have the flu. She was not tested. She was given a prescription for Tamiflu which she took home. After taking several doses of Tamiflu, she became quite nauseated and threw up each time. She was not able to take much more than that. Her abdominal pain however continued to persist. She felt like it was going on all the time and she could not really say if it was worse after eating. She feels like the vomiting would only occur after taking Tamiflu. Not much helped her abdominal pain including Motrin she came into the emergency room on the night of 2/23.  ED Course: in the emergency room, patient's labs were checked and found to be normal including a normal lipase level. Lactic acid level was only minimally above normal at 1.29, but well below concerns for sepsis or infection. White blood cell count was normal with no shift. A CT scan of the abdomen and pelvis was done which noted and unremarkable pancreas without any peripancreatic stranding. However, there was noted mild infiltration of the fat planes  surrounding the SMA which was a nonspecific finding and might be seen with acute or chronic pancreatitis. Patient responded to pain medication, however whenever attempts to give her anything oral after, she started having severe pain and became nauseated. Hospitalists were called for further evaluation  Review of Systems: Patient seein the emergency room. Pt complains of midepigastric pain, although it is better than before because she just received something for pain. Chest complains of some mild nausea. She feels very fatigued. She states that her upper respiratory symptoms that started earlier this week and will most fully resolved  Pt denies any headaches, vision changes, dysphagia, chest pain, palpitations, short of breath, wheeze, cough, hematuria, dysuria, constipation, diarrhea, focal extremity numbness weakness or pain .  Review of systems are otherwise negative   History reviewed. No pertinent past medical history. History reviewed. She did have a total hysterectomy  Social History:  reports that she has never smoked. She has never used smokeless tobacco. She reports that she does not drink alcohol or use drugs. Patient lives at home with her husband.    No Known Allergies  Family History  Problem Relation Age of Onset  . Gastric cancer Father   . Cancer Father   . Diabetes Father   . Heart disease Father   . Hypertension Father   . Deep vein thrombosis Mother       Prior to Admission medications   Medication Sig Start Date End Date Taking? Authorizing Provider  esomeprazole (NEXIUM) 20 MG capsule Take 40 mg by mouth daily at 12 noon.  Yes Historical Provider, MD  valACYclovir (VALTREX) 500 MG tablet Take 500 mg by mouth daily as needed (breakout).    Yes Historical Provider, MD  benzonatate (TESSALON) 100 MG capsule Take 1 capsule (100 mg total) by mouth 3 (three) times daily as needed for cough. Patient not taking: Reported on 09/27/2016 09/24/16   Katy Apo, NP    ondansetron (ZOFRAN) 4 MG tablet Take 1 tablet (4 mg total) by mouth every 8 (eight) hours as needed for nausea. Patient not taking: Reported on 09/27/2016 09/24/16   Katy Apo, NP    Physical Exam: BP 117/76 (BP Location: Left Arm)   Pulse 74   Temp 97.7 F (36.5 C) (Oral)   Resp 18   Ht 5\' 4"  (1.626 m)   Wt 72.6 kg (160 lb)   SpO2 98%   BMI 27.46 kg/m   General:   Alert and oriented 3, mild distress secondary to abdominal pain Eyes: sclera nonicteric, extraocular movements are intact  ENT: normocephalic and atraumatic, mucous membranes are dry  Neck: supple, no JVD  Cardiovascular: regular rate and rhythm, S1-S2  Respiratory: clear to auscultation bilaterally  Abdomen: soft,  nondistended, hypoactive bowel sounds with some mild tenderness in the midepigastric area. Skin: no skin breaks, tears or lesions  Musculoskeletal: no clubbing or cyanosis or edema  Psychiatric: patient is appropriate, no evidence of psychoses  Neurologic: no focal deficits           Labs on Admission:  Basic Metabolic Panel:  Recent Labs Lab 09/27/16 0411  NA 138  K 4.0  CL 103  CO2 26  GLUCOSE 111*  BUN 17  CREATININE 0.90  CALCIUM 9.0   Liver Function Tests:  Recent Labs Lab 09/27/16 0411  AST 36  ALT 47  ALKPHOS 75  BILITOT 0.6  PROT 7.3  ALBUMIN 4.0    Recent Labs Lab 09/27/16 0411  LIPASE 35   No results for input(s): AMMONIA in the last 168 hours. CBC:  Recent Labs Lab 09/27/16 0411  WBC 7.4  NEUTROABS 4.9  HGB 14.5  HCT 41.5  MCV 85.2  PLT 210   Cardiac Enzymes: No results for input(s): CKTOTAL, CKMB, CKMBINDEX, TROPONINI in the last 168 hours.  BNP (last 3 results) No results for input(s): BNP in the last 8760 hours.  ProBNP (last 3 results) No results for input(s): PROBNP in the last 8760 hours.  CBG: No results for input(s): GLUCAP in the last 168 hours.  Radiological Exams on Admission: Ct Abdomen Pelvis W Contrast  Result Date:  09/27/2016 CLINICAL DATA:  51 year old female with epigastric abdominal pain radiating to the back. EXAM: CT ABDOMEN AND PELVIS WITH CONTRAST TECHNIQUE: Multidetector CT imaging of the abdomen and pelvis was performed using the standard protocol following bolus administration of intravenous contrast. CONTRAST:  164mL ISOVUE-300 IOPAMIDOL (ISOVUE-300) INJECTION 61% COMPARISON:  Abdominal CT dated 12/05/2003 FINDINGS: Lower chest: The visualized lung bases are clear. No intra-abdominal free air or free fluid. Hepatobiliary: No focal liver abnormality is seen. No gallstones, gallbladder wall thickening, or biliary dilatation. Pancreas: The pancreas is unremarkable. No dilatation of the pancreatic duct or gland atrophy. There is however stranding of the fat plane surrounding the SMA which has minimally progressed since the prior CT. This finding is nonspecific and can be seen with inflammatory conditions such as acute or chronic pancreatitis as well as in pancreatic cancer. Correlation with clinical exam and pancreatic enzymes recommended. No peripancreatic fluid collection or abscess identified. Spleen: Normal in  size without focal abnormality. Adrenals/Urinary Tract: There adrenal glands appear unremarkable. Subcentimeter right renal hypodense lesion is too small to characterize but likely represents a cyst. There is no hydronephrosis or nephrolithiasis on either side. The visualized ureters and urinary bladder appear unremarkable. Stomach/Bowel: There is moderate stool throughout the colon. There is no evidence of bowel obstruction or active inflammation. The appendix is not visualized with certainty. No inflammatory changes identified in the right lower quadrant. Vascular/Lymphatic: The abdominal aorta and IVC appear unremarkable. The origins of the celiac axis, SMA, IMA as well as the origins of the renal arteries are patent. The SMV, splenic vein, and main portal vein are patent. No portal venous gas identified.  There is no adenopathy. Reproductive: The uterus is anteverted and grossly unremarkable. Small bilateral ovarian or paraovarian cyst measure up to 19 mm on the left. Ultrasound may provide better evaluation of the pelvic structures. Other: Small fat containing umbilical hernia. Musculoskeletal: No acute or significant osseous findings. IMPRESSION: 1. Unremarkable pancreas without peripancreatic stranding. There is however mild infiltration of the fat plane surrounding the SMA similar or slightly progressed compared to the prior CT. This finding is nonspecific and may be seen with acute or chronic pancreatitis. Correlation with clinical exam and pancreatic enzymes recommended. 2. No evidence of bowel obstruction or active inflammation. 3. Small bilateral ovarian cysts, likely physiologic. Electronically Signed   By: Anner Crete M.D.   On: 09/27/2016 06:48    EKG: Not done  Assessment/Plan Present on Admission: . Acute pancreatitis: I suspect that this is a case of Bactrim-induced pancreatitis. Patient's incomplete presentation with normal lipase and less than compelling findings on CT may be in part due to old Bactrim.  This is unusual. Nevertheless, we'll plan to treat as if she does have pancreatitis with checking fasting lipid panel in the morning. Nothing by mouth with pain and nausea medicine when necessary. Once her pain settled down, we'll restart diet slowly and transition upwards. Repeat lipase level in the morning.  Constipation: Noted on CT. Have started MiraLAX.  Overweight: Patient meets criteria with BMI greater than 25  Upper respiratory symptoms: Possible flu. Looks to have resolved. Active Problems:   Acute pancreatitis   DVT prophylaxis: SCDs given risk for hemorrhagic pancreatitis with Lovenox   Code Status: Full code   Family Communication: Left message for husband   Disposition Plan: Treating this as if this was pancreatitis, she will be here for several days until  able to take by mouth   Consults called: None   Admission status: Because patient is expected to be here past several nights requiring IV fluids and IV medications, placing her as inpatient   Annita Brod MD [facility] Pager [phone]  If 7PM-7AM, please contact night-coverage [email or web address] Password [email or web address]  09/27/2016, 1:14 PM

## 2016-09-28 ENCOUNTER — Encounter (HOSPITAL_COMMUNITY): Payer: Self-pay | Admitting: Gastroenterology

## 2016-09-28 ENCOUNTER — Inpatient Hospital Stay (HOSPITAL_COMMUNITY): Payer: 59

## 2016-09-28 DIAGNOSIS — K5900 Constipation, unspecified: Secondary | ICD-10-CM

## 2016-09-28 DIAGNOSIS — E663 Overweight: Secondary | ICD-10-CM

## 2016-09-28 DIAGNOSIS — K859 Acute pancreatitis without necrosis or infection, unspecified: Secondary | ICD-10-CM

## 2016-09-28 LAB — COMPREHENSIVE METABOLIC PANEL
ALT: 29 U/L (ref 14–54)
AST: 18 U/L (ref 15–41)
Albumin: 3.2 g/dL — ABNORMAL LOW (ref 3.5–5.0)
Alkaline Phosphatase: 55 U/L (ref 38–126)
Anion gap: 6 (ref 5–15)
BUN: 10 mg/dL (ref 6–20)
CHLORIDE: 109 mmol/L (ref 101–111)
CO2: 24 mmol/L (ref 22–32)
CREATININE: 0.91 mg/dL (ref 0.44–1.00)
Calcium: 8.4 mg/dL — ABNORMAL LOW (ref 8.9–10.3)
Glucose, Bld: 115 mg/dL — ABNORMAL HIGH (ref 65–99)
Potassium: 3.7 mmol/L (ref 3.5–5.1)
Sodium: 139 mmol/L (ref 135–145)
Total Bilirubin: 1.1 mg/dL (ref 0.3–1.2)
Total Protein: 6 g/dL — ABNORMAL LOW (ref 6.5–8.1)

## 2016-09-28 LAB — CBC
HCT: 35.8 % — ABNORMAL LOW (ref 36.0–46.0)
Hemoglobin: 12.1 g/dL (ref 12.0–15.0)
MCH: 28.9 pg (ref 26.0–34.0)
MCHC: 33.8 g/dL (ref 30.0–36.0)
MCV: 85.6 fL (ref 78.0–100.0)
PLATELETS: 179 10*3/uL (ref 150–400)
RBC: 4.18 MIL/uL (ref 3.87–5.11)
RDW: 13 % (ref 11.5–15.5)
WBC: 5.5 10*3/uL (ref 4.0–10.5)

## 2016-09-28 LAB — HIV ANTIBODY (ROUTINE TESTING W REFLEX): HIV SCREEN 4TH GENERATION: NONREACTIVE

## 2016-09-28 LAB — LIPASE, BLOOD: LIPASE: 32 U/L (ref 11–51)

## 2016-09-28 MED ORDER — PANTOPRAZOLE SODIUM 40 MG IV SOLR
40.0000 mg | Freq: Two times a day (BID) | INTRAVENOUS | Status: DC
  Administered 2016-09-28 – 2016-09-29 (×4): 40 mg via INTRAVENOUS
  Filled 2016-09-28 (×4): qty 40

## 2016-09-28 NOTE — Progress Notes (Signed)
PROGRESS NOTE  Robin Maynard  [id number] DOB: [birth year] DOA: 09/27/2016 PCP: Orpah Melter, MD   Brief Narrative: Robin Maynard is a 51 y.o. female with a remote history of gastritis who was admitted for epigastric abdominal pain. She had abrupt onset of cough, sore throat 2/19 for which she took bactrim which she was prescribed in the past. She then noticed severe epigastric pain radiating to the left back. She was seen at urgent care 2/21 for ongoing respiratory symptoms. She was empirically started on tamiflu and began having nausea and vomiting. Abdominal pain worsened and she has had minimal per oral intake, so she came to the ED 2/23.   In the ED, the patient had normal vital signs, normal CBC and CMP including normal lipase, lactic acid normal at 1.29. UPT was negative and urinalysis had no leukocytes by microscopy. A CT scan of the abdomen and pelvis was done which noted an unremarkable pancreas without any peripancreatic stranding. However, there was noted mild infiltration of the fat planes surrounding the SMA which was a nonspecific finding and might be seen with acute or chronic pancreatitis. Pain medication improved symptoms in the ED, but due to persistent nausea and vomiting and inability to take anything by mouth, she was admitted for IV fluids and further evaluation.   Assessment & Plan: Active Problems:   Acute pancreatitis   Constipation   Overweight (BMI 25.0-29.9)  Acute epigastric pain, nausea, vomiting: Pancreatitis considered plausible with suggestive, though not very diagnostic, CT findings. Normal lipase, bili, LFTs reassuring against hepatitis, cholecystitis. Possibly recurrent gastritis (previously Dx on EGD for similar symptoms). ?Manifestation of flu complicated by tamiflu side effects.  - GI consulted for epigastric pain of unknown etiology. Has seen Dr. Penelope Coop in the past.  - Gallbladder U/S ordered per GI. - Keep restricted diet for now - Hold  bactrim - FLP to rule out hyperlipidemia as cause of pancreatitis.  - PPI  Constipation: Noted on CT.  - Have started MiraLAX.  Overweight: BMI 27.46 kg/m.   DVT prophylaxis: SCDs, frequent ambulation Code Status: Full code Family Communication: Husband at bedside this AM Disposition Plan: Continue evaluation and management. Depending on work up and ability to tolerate a diet, anticipate discharge in next 48 hours.  Consultants:   GI, Dr. Oletta Lamas  Procedures:   None  Antimicrobials:  None   Subjective: Pt with nausea and emesis this morning, improved only mildly with zofran. No diarrhea, in fact has not had BM.   Objective: BP 101/75 (BP Location: Right Arm)   Pulse (!) 59   Temp 98.7 F (37.1 C) (Oral)   Resp 18   Ht 5\' 4"  (1.626 m)   Wt 72.6 kg (160 lb)   SpO2 98%   BMI 27.46 kg/m    General exam: 51 y.o. female in no distress Respiratory system: Non-labored breathing on room air. Clear to auscultation bilaterally.  Cardiovascular system: Regular rate and rhythm. No murmur, rub, or gallop. No JVD, and no pedal edema. Gastrointestinal system: Abdomen soft, tender without rebound in the epigastrium, non-distended, with hypoactive bowel sounds. No organomegaly or masses felt. Central nervous system: Alert and oriented. No focal neurological deficits. Extremities: Warm, no deformities Skin: No rashes, lesions no ulcers Psychiatry: Judgement and insight appear normal. Mood & affect appropriate.   Data Reviewed: I have personally reviewed following labs and imaging studies  CBC:  Recent Labs Lab 09/27/16 0411 09/28/16 0506  WBC 7.4 5.5  NEUTROABS 4.9  --   HGB  14.5 12.1  HCT 41.5 35.8*  MCV 85.2 85.6  PLT 210 0000000   Basic Metabolic Panel:  Recent Labs Lab 09/27/16 0411 09/28/16 0506  NA 138 139  K 4.0 3.7  CL 103 109  CO2 26 24  GLUCOSE 111* 115*  BUN 17 10  CREATININE 0.90 0.91  CALCIUM 9.0 8.4*   GFR: Estimated Creatinine Clearance:  71.5 mL/min (by C-G formula based on SCr of 0.91 mg/dL). Liver Function Tests:  Recent Labs Lab 09/27/16 0411 09/28/16 0506  AST 36 18  ALT 47 29  ALKPHOS 75 55  BILITOT 0.6 1.1  PROT 7.3 6.0*  ALBUMIN 4.0 3.2*    Recent Labs Lab 09/27/16 0411 09/28/16 0506  LIPASE 35 32   No results for input(s): AMMONIA in the last 168 hours. Coagulation Profile: No results for input(s): INR, PROTIME in the last 168 hours. Cardiac Enzymes: No results for input(s): CKTOTAL, CKMB, CKMBINDEX, TROPONINI in the last 168 hours. BNP (last 3 results) No results for input(s): PROBNP in the last 8760 hours. HbA1C: No results for input(s): HGBA1C in the last 72 hours. CBG: No results for input(s): GLUCAP in the last 168 hours. Lipid Profile: No results for input(s): CHOL, HDL, LDLCALC, TRIG, CHOLHDL, LDLDIRECT in the last 72 hours. Thyroid Function Tests: No results for input(s): TSH, T4TOTAL, FREET4, T3FREE, THYROIDAB in the last 72 hours. Anemia Panel: No results for input(s): VITAMINB12, FOLATE, FERRITIN, TIBC, IRON, RETICCTPCT in the last 72 hours. Urine analysis:    Component Value Date/Time   COLORURINE YELLOW 09/27/2016 0401   APPEARANCEUR CLEAR 09/27/2016 0401   LABSPEC 1.019 09/27/2016 0401   PHURINE 6.0 09/27/2016 0401   GLUCOSEU NEGATIVE 09/27/2016 0401   HGBUR NEGATIVE 09/27/2016 0401   BILIRUBINUR NEGATIVE 09/27/2016 0401   KETONESUR NEGATIVE 09/27/2016 0401   PROTEINUR NEGATIVE 09/27/2016 0401   NITRITE NEGATIVE 09/27/2016 0401   LEUKOCYTESUR TRACE (A) 09/27/2016 0401   No results found for this or any previous visit (from the past 240 hour(s)).    Radiology Studies: Ct Abdomen Pelvis W Contrast  Result Date: 09/27/2016 CLINICAL DATA:  51 year old female with epigastric abdominal pain radiating to the back. EXAM: CT ABDOMEN AND PELVIS WITH CONTRAST TECHNIQUE: Multidetector CT imaging of the abdomen and pelvis was performed using the standard protocol following bolus  administration of intravenous contrast. CONTRAST:  166mL ISOVUE-300 IOPAMIDOL (ISOVUE-300) INJECTION 61% COMPARISON:  Abdominal CT dated 12/05/2003 FINDINGS: Lower chest: The visualized lung bases are clear. No intra-abdominal free air or free fluid. Hepatobiliary: No focal liver abnormality is seen. No gallstones, gallbladder wall thickening, or biliary dilatation. Pancreas: The pancreas is unremarkable. No dilatation of the pancreatic duct or gland atrophy. There is however stranding of the fat plane surrounding the SMA which has minimally progressed since the prior CT. This finding is nonspecific and can be seen with inflammatory conditions such as acute or chronic pancreatitis as well as in pancreatic cancer. Correlation with clinical exam and pancreatic enzymes recommended. No peripancreatic fluid collection or abscess identified. Spleen: Normal in size without focal abnormality. Adrenals/Urinary Tract: There adrenal glands appear unremarkable. Subcentimeter right renal hypodense lesion is too small to characterize but likely represents a cyst. There is no hydronephrosis or nephrolithiasis on either side. The visualized ureters and urinary bladder appear unremarkable. Stomach/Bowel: There is moderate stool throughout the colon. There is no evidence of bowel obstruction or active inflammation. The appendix is not visualized with certainty. No inflammatory changes identified in the right lower quadrant. Vascular/Lymphatic: The abdominal aorta  and IVC appear unremarkable. The origins of the celiac axis, SMA, IMA as well as the origins of the renal arteries are patent. The SMV, splenic vein, and main portal vein are patent. No portal venous gas identified. There is no adenopathy. Reproductive: The uterus is anteverted and grossly unremarkable. Small bilateral ovarian or paraovarian cyst measure up to 19 mm on the left. Ultrasound may provide better evaluation of the pelvic structures. Other: Small fat containing  umbilical hernia. Musculoskeletal: No acute or significant osseous findings. IMPRESSION: 1. Unremarkable pancreas without peripancreatic stranding. There is however mild infiltration of the fat plane surrounding the SMA similar or slightly progressed compared to the prior CT. This finding is nonspecific and may be seen with acute or chronic pancreatitis. Correlation with clinical exam and pancreatic enzymes recommended. 2. No evidence of bowel obstruction or active inflammation. 3. Small bilateral ovarian cysts, likely physiologic. Electronically Signed   By: Anner Crete M.D.   On: 09/27/2016 06:48    Scheduled Meds: . pantoprazole (PROTONIX) IV  40 mg Intravenous Q12H  . polyethylene glycol  17 g Oral Once  . promethazine  25 mg Intravenous Once   Continuous Infusions: . dextrose 5 % and 0.9% NaCl 100 mL/hr at 09/27/16 2336     LOS: 1 day   Time spent: 25 minutes.  Vance Gather, MD [facility] Pager [phone]  If 7PM-7AM, please contact night-coverage [email or web address] Password [facility] 09/28/2016, 2:34 PM

## 2016-09-28 NOTE — Consult Note (Signed)
[facility] CONSULT Reason for consult: Epigastric Abdominal Pain Referring Physician: [facility]. PCP: Dr. Doyle Askew. Primary G.I.: Dr. Manfred Arch Robin Maynard is an 51 y.o. female.  HPI: she was admitted yesterday after epigastric pain and has been going on for approximately 45 days. Patient had been well up until about a week or so ago when she began to develop progressive respiratory symptoms with chronic severe coughing was slightly productive, nasal secretions etc. took some Tamiflu and was somewhat nausea. She started Bactrim which she had at home was an old prescription Monday and began to have epigastric pain boring through to her back on Tuesday. She was nauseatingly this had several episodes of vomiting. Her pain continued. She wasn't sure if the Bactrim or the Tamiflu were causing her to feel sick. Friday evening 2 days ago, the pain became so severe she could hardly stand it came to the emergency room during the night. Her labs were notable for a normal lipase, normal WBC and normal LFTs. A CT scan of the abdomen did not show any obvious signs of biliary disease or acute pancreatitis but did show some inflammatory response in the fat around the SMA which was felt to be nonspecific but can occur with resolving pancreatitis. She felt better with pain medication but has been unable to tolerate anything without pain and nausea. She notes that she had had some Nexium at home and had taken this for several doses during this period without any real improvement. Been seen in the past by Dr. Penelope Coop underwent EGD about 10 years ago showing gastritis was treated with next. Tests were negative for H. pylori. She improved with a Nexium has been taking another PRN basis. She denies any chronic symptoms of reflux. Her gallbladder was tested over 10 years ago was negative. Her sister did have her gallbladder removed.  History reviewed. No pertinent past medical history. History of gastritis in the  past by EGD  History reviewed. No pertinent surgical history. She has never had any abdominal surgery  Family History  Problem Relation Age of Onset  . Gastric cancer Father   . Cancer Father   . Diabetes Father   . Heart disease Father   . Hypertension Father   . Deep vein thrombosis Mother     Social History:  reports that she has never smoked. She has never used smokeless tobacco. She reports that she does not drink alcohol or use drugs.  Allergies: No Known Allergies  Medications; Prior to Admission medications   Medication Sig Start Date End Date Taking? Authorizing Provider  esomeprazole (NEXIUM) 20 MG capsule Take 40 mg by mouth daily at 12 noon.    Yes Historical Provider, MD  valACYclovir (VALTREX) 500 MG tablet Take 500 mg by mouth daily as needed (breakout).    Yes Historical Provider, MD  benzonatate (TESSALON) 100 MG capsule Take 1 capsule (100 mg total) by mouth 3 (three) times daily as needed for cough. Patient not taking: Reported on 09/27/2016 09/24/16   Katy Apo, NP  ondansetron (ZOFRAN) 4 MG tablet Take 1 tablet (4 mg total) by mouth every 8 (eight) hours as needed for nausea. Patient not taking: Reported on 09/27/2016 09/24/16   Katy Apo, NP   . polyethylene glycol  17 g Oral Once  . promethazine  25 mg Intravenous Once   PRN Meds morphine injection, ondansetron **OR** ondansetron (ZOFRAN) IV Results for orders placed or performed during the hospital encounter of 09/27/16 (from the past  48 hour(s))  Urinalysis, Routine w reflex microscopic     Status: Abnormal   Collection Time: 09/27/16  4:01 AM  Result Value Ref Range   Color, Urine YELLOW YELLOW   APPearance CLEAR CLEAR   Specific Gravity, Urine 1.019 1.005 - 1.030   pH 6.0 5.0 - 8.0   Glucose, UA NEGATIVE NEGATIVE mg/dL   Hgb urine dipstick NEGATIVE NEGATIVE   Bilirubin Urine NEGATIVE NEGATIVE   Ketones, ur NEGATIVE NEGATIVE mg/dL   Protein, ur NEGATIVE NEGATIVE mg/dL   Nitrite NEGATIVE  NEGATIVE   Leukocytes, UA TRACE (A) NEGATIVE   RBC / HPF 0-5 0 - 5 RBC/hpf   WBC, UA 0-5 0 - 5 WBC/hpf   Bacteria, UA RARE (A) NONE SEEN   Squamous Epithelial / LPF 0-5 (A) NONE SEEN   Mucous PRESENT   Pregnancy, urine     Status: None   Collection Time: 09/27/16  4:01 AM  Result Value Ref Range   Preg Test, Ur NEGATIVE NEGATIVE    Comment:        THE SENSITIVITY OF THIS METHODOLOGY IS >20 mIU/mL.   Lipase, blood     Status: None   Collection Time: 09/27/16  4:11 AM  Result Value Ref Range   Lipase 35 11 - 51 U/L  Comprehensive metabolic panel     Status: Abnormal   Collection Time: 09/27/16  4:11 AM  Result Value Ref Range   Sodium 138 135 - 145 mmol/L   Potassium 4.0 3.5 - 5.1 mmol/L   Chloride 103 101 - 111 mmol/L   CO2 26 22 - 32 mmol/L   Glucose, Bld 111 (H) 65 - 99 mg/dL   BUN 17 6 - 20 mg/dL   Creatinine, Ser 0.90 0.44 - 1.00 mg/dL   Calcium 9.0 8.9 - 10.3 mg/dL   Total Protein 7.3 6.5 - 8.1 g/dL   Albumin 4.0 3.5 - 5.0 g/dL   AST 36 15 - 41 U/L   ALT 47 14 - 54 U/L   Alkaline Phosphatase 75 38 - 126 U/L   Total Bilirubin 0.6 0.3 - 1.2 mg/dL   GFR calc non Af Amer >60 >60 mL/min   GFR calc Af Amer >60 >60 mL/min    Comment: (NOTE) The eGFR has been calculated using the CKD EPI equation. This calculation has not been validated in all clinical situations. eGFR's persistently <60 mL/min signify possible Chronic Kidney Disease.    Anion gap 9 5 - 15  CBC with Differential     Status: None   Collection Time: 09/27/16  4:11 AM  Result Value Ref Range   WBC 7.4 4.0 - 10.5 K/uL   RBC 4.87 3.87 - 5.11 MIL/uL   Hemoglobin 14.5 12.0 - 15.0 g/dL   HCT 41.5 36.0 - 46.0 %   MCV 85.2 78.0 - 100.0 fL   MCH 29.8 26.0 - 34.0 pg   MCHC 34.9 30.0 - 36.0 g/dL   RDW 12.7 11.5 - 15.5 %   Platelets 210 150 - 400 K/uL   Neutrophils Relative % 65 %   Lymphocytes Relative 26 %   Monocytes Relative 6 %   Eosinophils Relative 3 %   Basophils Relative 0 %   Neutro Abs 4.9 1.7 -  7.7 K/uL   Lymphs Abs 1.9 0.7 - 4.0 K/uL   Monocytes Absolute 0.4 0.1 - 1.0 K/uL   Eosinophils Absolute 0.2 0.0 - 0.7 K/uL   Basophils Absolute 0.0 0.0 - 0.1 K/uL   WBC  Morphology ATYPICAL LYMPHOCYTES   I-Stat CG4 Lactic Acid, ED     Status: None   Collection Time: 09/27/16  5:01 AM  Result Value Ref Range   Lactic Acid, Venous 1.29 0.5 - 1.9 mmol/L  I-Stat CG4 Lactic Acid, ED     Status: None   Collection Time: 09/27/16  7:45 AM  Result Value Ref Range   Lactic Acid, Venous 0.99 0.5 - 1.9 mmol/L  Comprehensive metabolic panel     Status: Abnormal   Collection Time: 09/28/16  5:06 AM  Result Value Ref Range   Sodium 139 135 - 145 mmol/L   Potassium 3.7 3.5 - 5.1 mmol/L   Chloride 109 101 - 111 mmol/L   CO2 24 22 - 32 mmol/L   Glucose, Bld 115 (H) 65 - 99 mg/dL   BUN 10 6 - 20 mg/dL   Creatinine, Ser 0.91 0.44 - 1.00 mg/dL   Calcium 8.4 (L) 8.9 - 10.3 mg/dL   Total Protein 6.0 (L) 6.5 - 8.1 g/dL   Albumin 3.2 (L) 3.5 - 5.0 g/dL   AST 18 15 - 41 U/L   ALT 29 14 - 54 U/L   Alkaline Phosphatase 55 38 - 126 U/L   Total Bilirubin 1.1 0.3 - 1.2 mg/dL   GFR calc non Af Amer >60 >60 mL/min   GFR calc Af Amer >60 >60 mL/min    Comment: (NOTE) The eGFR has been calculated using the CKD EPI equation. This calculation has not been validated in all clinical situations. eGFR's persistently <60 mL/min signify possible Chronic Kidney Disease.    Anion gap 6 5 - 15  CBC     Status: Abnormal   Collection Time: 09/28/16  5:06 AM  Result Value Ref Range   WBC 5.5 4.0 - 10.5 K/uL   RBC 4.18 3.87 - 5.11 MIL/uL   Hemoglobin 12.1 12.0 - 15.0 g/dL   HCT 35.8 (L) 36.0 - 46.0 %   MCV 85.6 78.0 - 100.0 fL   MCH 28.9 26.0 - 34.0 pg   MCHC 33.8 30.0 - 36.0 g/dL   RDW 13.0 11.5 - 15.5 %   Platelets 179 150 - 400 K/uL  Lipase, blood     Status: None   Collection Time: 09/28/16  5:06 AM  Result Value Ref Range   Lipase 32 11 - 51 U/L    Ct Abdomen Pelvis W Contrast  Result Date:  09/27/2016 CLINICAL DATA:  51 year old female with epigastric abdominal pain radiating to the back. EXAM: CT ABDOMEN AND PELVIS WITH CONTRAST TECHNIQUE: Multidetector CT imaging of the abdomen and pelvis was performed using the standard protocol following bolus administration of intravenous contrast. CONTRAST:  151m ISOVUE-300 IOPAMIDOL (ISOVUE-300) INJECTION 61% COMPARISON:  Abdominal CT dated 12/05/2003 FINDINGS: Lower chest: The visualized lung bases are clear. No intra-abdominal free air or free fluid. Hepatobiliary: No focal liver abnormality is seen. No gallstones, gallbladder wall thickening, or biliary dilatation. Pancreas: The pancreas is unremarkable. No dilatation of the pancreatic duct or gland atrophy. There is however stranding of the fat plane surrounding the SMA which has minimally progressed since the prior CT. This finding is nonspecific and can be seen with inflammatory conditions such as acute or chronic pancreatitis as well as in pancreatic cancer. Correlation with clinical exam and pancreatic enzymes recommended. No peripancreatic fluid collection or abscess identified. Spleen: Normal in size without focal abnormality. Adrenals/Urinary Tract: There adrenal glands appear unremarkable. Subcentimeter right renal hypodense lesion is too small to characterize but likely  represents a cyst. There is no hydronephrosis or nephrolithiasis on either side. The visualized ureters and urinary bladder appear unremarkable. Stomach/Bowel: There is moderate stool throughout the colon. There is no evidence of bowel obstruction or active inflammation. The appendix is not visualized with certainty. No inflammatory changes identified in the right lower quadrant. Vascular/Lymphatic: The abdominal aorta and IVC appear unremarkable. The origins of the celiac axis, SMA, IMA as well as the origins of the renal arteries are patent. The SMV, splenic vein, and main portal vein are patent. No portal venous gas identified.  There is no adenopathy. Reproductive: The uterus is anteverted and grossly unremarkable. Small bilateral ovarian or paraovarian cyst measure up to 19 mm on the left. Ultrasound may provide better evaluation of the pelvic structures. Other: Small fat containing umbilical hernia. Musculoskeletal: No acute or significant osseous findings. IMPRESSION: 1. Unremarkable pancreas without peripancreatic stranding. There is however mild infiltration of the fat plane surrounding the SMA similar or slightly progressed compared to the prior CT. This finding is nonspecific and may be seen with acute or chronic pancreatitis. Correlation with clinical exam and pancreatic enzymes recommended. 2. No evidence of bowel obstruction or active inflammation. 3. Small bilateral ovarian cysts, likely physiologic. Electronically Signed   By: Anner Crete M.D.   On: 09/27/2016 06:48               Blood pressure 100/67, pulse 68, temperature 98.6 F (37 C), temperature source Oral, resp. rate 18, height 5' 4"  (1.626 m), weight 72.6 kg (160 lb), SpO2 95 %.  Physical exam:   General-- alert white female and no obvious distress holding an emesis bag but has not vomited ENT-- nonicteric Neck-- full range of motion, supple Heart-- normal S1 and S2 without murmur Lungs-- clear Abdomen-- nondistended, tender the epigastric minimal bowel sounds Psych-- alert and oriented answers questions appropriately   Assessment: 1. Epigastric pain, nausea and vomiting. This is somewhat puzzling. Her CT shows a bit of fluid around the SMA. She could've had pancreatitis but this is not really obvious. Her gallbladder has not been checked in over 10 years and I think I'll go ahead with that. She could've had about a mile pancreatitis as resolved with still some symptoms.  Plan: 1. Will go ahead and place her on IV PPI to see if this will help. 2. Water ultrasound of the gallbladder in the morning.   Robin Maynard JR,Majesta Leichter L 09/28/2016,  1:06 PM   This note was created using voice recognition software and minor errors may Have occurred unintentionally. Pager: [phone] If no answer or after hours call [phone]

## 2016-09-29 LAB — COMPREHENSIVE METABOLIC PANEL
ALT: 24 U/L (ref 14–54)
AST: 15 U/L (ref 15–41)
Albumin: 3.3 g/dL — ABNORMAL LOW (ref 3.5–5.0)
Alkaline Phosphatase: 58 U/L (ref 38–126)
Anion gap: 6 (ref 5–15)
BILIRUBIN TOTAL: 0.7 mg/dL (ref 0.3–1.2)
BUN: 9 mg/dL (ref 6–20)
CO2: 23 mmol/L (ref 22–32)
CREATININE: 0.73 mg/dL (ref 0.44–1.00)
Calcium: 8.7 mg/dL — ABNORMAL LOW (ref 8.9–10.3)
Chloride: 111 mmol/L (ref 101–111)
Glucose, Bld: 118 mg/dL — ABNORMAL HIGH (ref 65–99)
POTASSIUM: 4 mmol/L (ref 3.5–5.1)
Sodium: 140 mmol/L (ref 135–145)
Total Protein: 6.2 g/dL — ABNORMAL LOW (ref 6.5–8.1)

## 2016-09-29 NOTE — Progress Notes (Addendum)
[facility] Gastroenterology Progress Note  Robin Maynard 51 y.o. 1966/05/07   Subjective: Feels a lot better. Minimal abdominal pain. Denies N/V.  Objective: Vital signs in last 24 hours: Vitals:   09/28/16 2006 09/29/16 0517  BP: 107/71 111/81  Pulse: 60 65  Resp:  18  Temp: 98.4 F (36.9 C) 98.4 F (36.9 C)    Physical Exam: Gen: alert, no acute distress CV: RRR Chest: CTA B Abd: epigastric tenderness with minimal guarding, soft, nondistended, +BS Ext: no edema   Lab Results:  Recent Labs  09/28/16 0506 09/29/16 0524  NA 139 140  K 3.7 4.0  CL 109 111  CO2 24 23  GLUCOSE 115* 118*  BUN 10 9  CREATININE 0.91 0.73  CALCIUM 8.4* 8.7*    Recent Labs  09/28/16 0506 09/29/16 0524  AST 18 15  ALT 29 24  ALKPHOS 55 58  BILITOT 1.1 0.7  PROT 6.0* 6.2*  ALBUMIN 3.2* 3.3*    Recent Labs  09/27/16 0411 09/28/16 0506  WBC 7.4 5.5  NEUTROABS 4.9  --   HGB 14.5 12.1  HCT 41.5 35.8*  MCV 85.2 85.6  PLT 210 179   No results for input(s): LABPROT, INR in the last 72 hours.    Assessment/Plan: Epigastric pain - unclear etiology but clinically improving. Tried to reassure. Recent NSAID use could have caused gastritis. Slowly advance diet as tolerated and if unable to advance then will do an EGD. If able to tolerate diet advancement, then will hold off on EGD. Advised to avoid all NSAIDs. D/C tomorrow if able to eat ok. Will sign off. F/U with Dr. Oletta Lamas in 3-4 weeks.   [place] C. 09/29/2016, 12:50 PM   Pager [phone]  If no answer or after 5 PM call [phone] ID: Robin Maynard, female   DOB: 03/20/1966, 51 y.o.   MRN: [id number]

## 2016-09-29 NOTE — Progress Notes (Signed)
PROGRESS NOTE  Robin Maynard  [id number] DOB: 23-Mar-1966 DOA: 09/27/2016 PCP: Orpah Melter, MD   Brief Narrative: Robin Maynard is a 51 y.o. female with a remote history of gastritis who was admitted for epigastric abdominal pain. She had abrupt onset of cough, sore throat 2/19 for which she took bactrim which she was prescribed in the past. She then noticed severe epigastric pain radiating to the left back. She was seen at urgent care 2/21 for ongoing respiratory symptoms. She was empirically started on tamiflu and began having nausea and vomiting. Abdominal pain worsened and she has had minimal per oral intake, so she came to the ED 2/23.   In the ED, the patient had normal vital signs, normal CBC and CMP including normal lipase, lactic acid normal at 1.29. UPT was negative and urinalysis had no leukocytes by microscopy. A CT scan of the abdomen and pelvis was done which noted an unremarkable pancreas without any peripancreatic stranding. However, there was noted mild infiltration of the fat planes surrounding the SMA which was a nonspecific finding and might be seen with acute or chronic pancreatitis. Pain medication improved symptoms in the ED, but due to persistent nausea and vomiting and inability to take anything by mouth, she was admitted for IV fluids and further evaluation. Symptoms resolved while NPO.   Assessment & Plan: Active Problems:   Acute pancreatitis   Constipation   Overweight (BMI 25.0-29.9)  Acute epigastric pain, nausea, vomiting: Pancreatitis considered plausible with suggestive, though not very diagnostic, CT findings. Normal lipase, bili, LFTs reassuring against hepatitis, cholecystitis. Possibly recurrent gastritis (previously Dx on EGD for similar symptoms). ?Manifestation of flu complicated by tamiflu side effects. Has seen Dr. Penelope Coop in the past. Complete abdominal U/S was normal. - GI recommendations appreciated: Will advance diet and DC if tolerates.  Considering EGD if not. - Hold bactrim - PPI  Constipation: Noted on CT.  - Have started MiraLAX.  Overweight: BMI 27.46 kg/m.   DVT prophylaxis: SCDs, frequent ambulation Code Status: Full code Family Communication: Husband at bedside this AM Disposition Plan: Continue evaluation and management. If tolerates diet advancement, DC in AM. If not, EGD.   Consultants:   GI, Dr. Oletta Lamas  Procedures:   None  Antimicrobials:  None   Subjective: Feels better. No nausea, minimal abd pain. Wants to try advancing diet.   Objective: BP 111/81 (BP Location: Right Arm)   Pulse 65   Temp 98.4 F (36.9 C) (Oral)   Resp 18   Ht 5\' 4"  (1.626 m)   Wt 72.6 kg (160 lb)   SpO2 98%   BMI 27.46 kg/m    General exam: 50 y.o. female in no distress Respiratory system: Non-labored breathing on room air. Clear to auscultation bilaterally.  Cardiovascular system: Regular rate and rhythm. No murmur, rub, or gallop. No JVD, and no pedal edema. Gastrointestinal system: Abdomen soft, minimal tenderness without rebound in the epigastrium, non-distended, with hypoactive bowel sounds. No organomegaly or masses felt. Central nervous system: Alert and oriented. Normal speech and gait. No focal neurological deficits. Extremities: Warm, no deformities Skin: No rashes, lesions no ulcers Psychiatry: Judgement and insight appear normal. Mood & affect appropriate.   Data Reviewed: I have personally reviewed following labs and imaging studies  CBC:  Recent Labs Lab 09/27/16 0411 09/28/16 0506  WBC 7.4 5.5  NEUTROABS 4.9  --   HGB 14.5 12.1  HCT 41.5 35.8*  MCV 85.2 85.6  PLT 210 0000000   Basic Metabolic Panel:  Recent Labs Lab 09/27/16 0411 09/28/16 0506 09/29/16 0524  NA 138 139 140  K 4.0 3.7 4.0  CL 103 109 111  CO2 26 24 23   GLUCOSE 111* 115* 118*  BUN 17 10 9   CREATININE 0.90 0.91 0.73  CALCIUM 9.0 8.4* 8.7*   GFR: Estimated Creatinine Clearance: 81.3 mL/min (by C-G formula  based on SCr of 0.73 mg/dL). Liver Function Tests:  Recent Labs Lab 09/27/16 0411 09/28/16 0506 09/29/16 0524  AST 36 18 15  ALT 47 29 24  ALKPHOS 75 55 58  BILITOT 0.6 1.1 0.7  PROT 7.3 6.0* 6.2*  ALBUMIN 4.0 3.2* 3.3*    Recent Labs Lab 09/27/16 0411 09/28/16 0506  LIPASE 35 32   Urine analysis:    Component Value Date/Time   COLORURINE YELLOW 09/27/2016 0401   APPEARANCEUR CLEAR 09/27/2016 0401   LABSPEC 1.019 09/27/2016 0401   PHURINE 6.0 09/27/2016 0401   GLUCOSEU NEGATIVE 09/27/2016 0401   HGBUR NEGATIVE 09/27/2016 0401   BILIRUBINUR NEGATIVE 09/27/2016 0401   KETONESUR NEGATIVE 09/27/2016 0401   PROTEINUR NEGATIVE 09/27/2016 0401   NITRITE NEGATIVE 09/27/2016 0401   LEUKOCYTESUR TRACE (A) 09/27/2016 0401   No results found for this or any previous visit (from the past 240 hour(s)).    Radiology Studies: US Abdomen Complete  Result Date: 09/28/2016 CLINICAL DATA:  51 year old female with abdominal pain. EXAM: ABDOMEN ULTRASOUND COMPLETE COMPARISON:  09/27/2016 CT and 01/26/2004 ultrasound FINDINGS: Gallbladder: The gallbladder is unremarkable. There is no evidence of cholelithiasis or acute cholecystitis. Common bile duct: Diameter: 6 mm. There is no evidence of intrahepatic or extrahepatic biliary dilatation. Liver: No focal lesion identified. Within normal limits in parenchymal echogenicity. IVC: No abnormality visualized. Pancreas: Visualized portion unremarkable. Spleen: Size and appearance within normal limits. Right Kidney: Length: 11.2 cm. Echogenicity within normal limits. No mass or hydronephrosis visualized. Left Kidney: Length: 11.3 cm. Echogenicity within normal limits. No mass or hydronephrosis visualized. Area of slightly decreased echogenicity in the upper left kidney is felt to represent artifact as there is no abnormality identified on recent CT. Abdominal aorta: No aneurysm visualized. Other findings: None. IMPRESSION: Unremarkable abdominal  ultrasound. Electronically Signed   By: Margarette Canada M.D.   On: 09/28/2016 15:37    Scheduled Meds: . pantoprazole (PROTONIX) IV  40 mg Intravenous Q12H   Continuous Infusions:    LOS: 2 days   Time spent: 25 minutes.  Vance Gather, MD [facility] Pager [phone]  If 7PM-7AM, please contact night-coverage [email or web address] Password [email or web address] 09/29/2016, 1:37 PM

## 2016-09-30 DIAGNOSIS — R1013 Epigastric pain: Secondary | ICD-10-CM | POA: Diagnosis present

## 2016-09-30 MED ORDER — PANTOPRAZOLE SODIUM 40 MG PO TBEC
40.0000 mg | DELAYED_RELEASE_TABLET | Freq: Two times a day (BID) | ORAL | Status: DC
  Administered 2016-09-30: 40 mg via ORAL
  Filled 2016-09-30: qty 1

## 2016-09-30 MED ORDER — ESOMEPRAZOLE MAGNESIUM 20 MG PO CPDR: 40.0000 mg | DELAYED_RELEASE_CAPSULE | Freq: Every day | ORAL | 0 refills | Status: DC

## 2016-09-30 NOTE — Progress Notes (Signed)
PHARMACIST - PHYSICIAN COMMUNICATION  DR:  Bonner Puna  CONCERNING: IV to Oral Route Change Policy  RECOMMENDATION: This patient is receiving Protonix by the intravenous route.  Based on criteria approved by the Pharmacy and Therapeutics Committee, the intravenous medication(s) is/are being converted to the equivalent oral dose form(s).   DESCRIPTION: These criteria include:  The patient is eating (either orally or via tube) and/or has been taking other orally administered medications for a least 24 hours  The patient has no evidence of active gastrointestinal bleeding or impaired GI absorption (gastrectomy, short bowel, patient on TNA or NPO).  RN requesting change to PO, patient ready for discharge.   If you have questions about this conversion, please contact the Pharmacy Department  []   [phone] )  [facility] []   [phone] )  [facility] []   [phone] )  [facility] []   [phone] )  [facility] [x]   [phone] )  Ruth, [place], [facility] 09/30/2016 8:44 AM

## 2016-09-30 NOTE — Discharge Summary (Signed)
Physician Discharge Summary  Robin Maynard [id number] DOB: 1966/01/28 DOA: 09/27/2016  PCP: Orpah Melter, MD  Admit date: 09/27/2016 Discharge date: 09/30/2016  Admitted From: Home Disposition: Home   Recommendations for Outpatient Follow-up:  1. Follow up with PCP in 1-2 weeks 2. Follow up with [facility] in next 2-4 weeks  Home Health: None Equipment/Devices: None Discharge Condition: Stable CODE STATUS: Full Diet recommendation: Regular as tolerated  Brief/Interim Summary: Robin Luby Mirarchiis a 51 y.o.femalewith a remote history of gastritis who was admitted for epigastric abdominal pain. She had abrupt onset of cough, sore throat 2/19 for which she took bactrim which she was prescribed in the past. She then noticed severe epigastric pain radiating to the left back. She was seen at urgent care 2/21 for ongoing respiratory symptoms. She was empirically started on tamiflu and began having nausea and vomiting. Abdominal pain worsened and she has had minimal per oral intake, so she came to the ED 2/23.   In the ED, the patient had normal vital signs, normal CBC and CMP including normal lipase, lactic acid normal at 1.29. UPT was negative and urinalysis had no leukocytes by microscopy. A CT scan of the abdomen and pelvis was done which noted an unremarkable pancreas without any peripancreatic stranding. However, there was noted mild infiltration of the fat planes surrounding the SMA which was a nonspecific finding and might be seen with acute or chronic pancreatitis. Pain medication improved symptoms in the ED, but due to persistent nausea and vomiting and inability to take anything by mouth, she was admitted for IV fluids and further evaluation. Symptoms resolved with PPI while NPO. Subsequent abdominal ultrasound was unremarkable. With slowly advancing diet, symptoms have not recurred. She will be discharged with directions to follow up with GI as an outpatient for consideration of  EGD.   Discharge Diagnoses:  Principal Problem:   Abdominal pain, epigastric Active Problems:   Acute pancreatitis   Constipation   Overweight (BMI 25.0-29.9)  Acute epigastric pain, nausea, vomiting: Resolved. Possibly recurrent gastritis (previously Dx on EGD for similar symptoms) improved with PPI. Pancreatitis considered plausible with suggestive, though not very diagnostic, CT findings. Normal lipase, bili, LFTs reassuring against hepatitis, cholecystitis. ?Manifestation of flu complicated by tamiflu side effects. Has seen Dr. Penelope Coop in the past. Complete abdominal U/S was normal. - GI recommendations appreciated. No symptom recurrence with advancing diet. Pt wants to go home. - Will continue PPI and follow up with GI.  Constipation: Noted on CT.  - Have started MiraLAX.  Overweight: BMI 27.46 kg/m.   Discharge Instructions Discharge Instructions    Discharge instructions    Complete by:  As directed    You were admitted with epigastric abdominal pain and inability to take food or fluids by mouth. Work up including lab work and abdominal ultrasound did not reveal a definite cause of these symptoms. It is possible that symptoms were related to transient viral illness, reaction to tamiflu, and/or gastritis. These symptoms have resolved and you are tolerating a diet, so you may be discharged with the following recommendations.  - Continue to maintain adequate hydration. If you are unable to tolerate fluids by mouth, seek medical attention.  - Continue taking nexium daily for gastritis. - Follow up with [facility] in 4 weeks.     Allergies as of 09/30/2016   No Known Allergies     Medication List    STOP taking these medications   benzonatate 100 MG capsule Commonly known as:  TESSALON  TAKE these medications   esomeprazole 20 MG capsule Commonly known as:  NEXIUM Take 2 capsules (40 mg total) by mouth daily at 12 noon.   ondansetron 4 MG tablet Commonly known as:   ZOFRAN Take 1 tablet (4 mg total) by mouth every 8 (eight) hours as needed for nausea.   valACYclovir 500 MG tablet Commonly known as:  VALTREX Take 500 mg by mouth daily as needed (breakout).       No Known Allergies  Consultations:  [facility], Drs. Michail Sermon and Oletta Lamas  Procedures/Studies: US Abdomen Complete  Result Date: 09/28/2016 CLINICAL DATA:  51 year old female with abdominal pain. EXAM: ABDOMEN ULTRASOUND COMPLETE COMPARISON:  09/27/2016 CT and 01/26/2004 ultrasound FINDINGS: Gallbladder: The gallbladder is unremarkable. There is no evidence of cholelithiasis or acute cholecystitis. Common bile duct: Diameter: 6 mm. There is no evidence of intrahepatic or extrahepatic biliary dilatation. Liver: No focal lesion identified. Within normal limits in parenchymal echogenicity. IVC: No abnormality visualized. Pancreas: Visualized portion unremarkable. Spleen: Size and appearance within normal limits. Right Kidney: Length: 11.2 cm. Echogenicity within normal limits. No mass or hydronephrosis visualized. Left Kidney: Length: 11.3 cm. Echogenicity within normal limits. No mass or hydronephrosis visualized. Area of slightly decreased echogenicity in the upper left kidney is felt to represent artifact as there is no abnormality identified on recent CT. Abdominal aorta: No aneurysm visualized. Other findings: None. IMPRESSION: Unremarkable abdominal ultrasound. Electronically Signed   By: Margarette Canada M.D.   On: 09/28/2016 15:37   Ct Abdomen Pelvis W Contrast  Result Date: 09/27/2016 CLINICAL DATA:  51 year old female with epigastric abdominal pain radiating to the back. EXAM: CT ABDOMEN AND PELVIS WITH CONTRAST TECHNIQUE: Multidetector CT imaging of the abdomen and pelvis was performed using the standard protocol following bolus administration of intravenous contrast. CONTRAST:  124mL ISOVUE-300 IOPAMIDOL (ISOVUE-300) INJECTION 61% COMPARISON:  Abdominal CT dated 12/05/2003 FINDINGS: Lower chest:  The visualized lung bases are clear. No intra-abdominal free air or free fluid. Hepatobiliary: No focal liver abnormality is seen. No gallstones, gallbladder wall thickening, or biliary dilatation. Pancreas: The pancreas is unremarkable. No dilatation of the pancreatic duct or gland atrophy. There is however stranding of the fat plane surrounding the SMA which has minimally progressed since the prior CT. This finding is nonspecific and can be seen with inflammatory conditions such as acute or chronic pancreatitis as well as in pancreatic cancer. Correlation with clinical exam and pancreatic enzymes recommended. No peripancreatic fluid collection or abscess identified. Spleen: Normal in size without focal abnormality. Adrenals/Urinary Tract: There adrenal glands appear unremarkable. Subcentimeter right renal hypodense lesion is too small to characterize but likely represents a cyst. There is no hydronephrosis or nephrolithiasis on either side. The visualized ureters and urinary bladder appear unremarkable. Stomach/Bowel: There is moderate stool throughout the colon. There is no evidence of bowel obstruction or active inflammation. The appendix is not visualized with certainty. No inflammatory changes identified in the right lower quadrant. Vascular/Lymphatic: The abdominal aorta and IVC appear unremarkable. The origins of the celiac axis, SMA, IMA as well as the origins of the renal arteries are patent. The SMV, splenic vein, and main portal vein are patent. No portal venous gas identified. There is no adenopathy. Reproductive: The uterus is anteverted and grossly unremarkable. Small bilateral ovarian or paraovarian cyst measure up to 19 mm on the left. Ultrasound may provide better evaluation of the pelvic structures. Other: Small fat containing umbilical hernia. Musculoskeletal: No acute or significant osseous findings. IMPRESSION: 1. Unremarkable pancreas without peripancreatic  stranding. There is however mild  infiltration of the fat plane surrounding the SMA similar or slightly progressed compared to the prior CT. This finding is nonspecific and may be seen with acute or chronic pancreatitis. Correlation with clinical exam and pancreatic enzymes recommended. 2. No evidence of bowel obstruction or active inflammation. 3. Small bilateral ovarian cysts, likely physiologic. Electronically Signed   By: Anner Crete M.D.   On: 09/27/2016 06:48   Subjective: Pt feels well. Walking around, ate full breakfast without abdominal pain, nausea or vomiting.  Discharge Exam: Vitals:   09/29/16 2037 09/30/16 0636  BP: 103/66 103/76  Pulse: 65 73  Resp: 18 18  Temp: 98.1 F (36.7 C) 97.6 F (36.4 C)   General: Pt is alert, awake, not in acute distress Cardiovascular: RRR, S1/S2 +, no rubs, no gallops Respiratory: CTA bilaterally, no wheezing, no rhonchi Abdominal: Soft, NT, ND, bowel sounds + Extremities: No edema, no cyanosis  The results of significant diagnostics from this hospitalization (including imaging, microbiology, ancillary and laboratory) are listed below for reference.    Labs: Basic Metabolic Panel:  Recent Labs Lab 09/27/16 0411 09/28/16 0506 09/29/16 0524  NA 138 139 140  K 4.0 3.7 4.0  CL 103 109 111  CO2 26 24 23   GLUCOSE 111* 115* 118*  BUN 17 10 9   CREATININE 0.90 0.91 0.73  CALCIUM 9.0 8.4* 8.7*   Liver Function Tests:  Recent Labs Lab 09/27/16 0411 09/28/16 0506 09/29/16 0524  AST 36 18 15  ALT 47 29 24  ALKPHOS 75 55 58  BILITOT 0.6 1.1 0.7  PROT 7.3 6.0* 6.2*  ALBUMIN 4.0 3.2* 3.3*    Recent Labs Lab 09/27/16 0411 09/28/16 0506  LIPASE 35 32   CBC:  Recent Labs Lab 09/27/16 0411 09/28/16 0506  WBC 7.4 5.5  NEUTROABS 4.9  --   HGB 14.5 12.1  HCT 41.5 35.8*  MCV 85.2 85.6  PLT 210 179   Urinalysis    Component Value Date/Time   COLORURINE YELLOW 09/27/2016 0401   APPEARANCEUR CLEAR 09/27/2016 0401   LABSPEC 1.019 09/27/2016 0401    PHURINE 6.0 09/27/2016 0401   GLUCOSEU NEGATIVE 09/27/2016 0401   HGBUR NEGATIVE 09/27/2016 0401   BILIRUBINUR NEGATIVE 09/27/2016 0401   KETONESUR NEGATIVE 09/27/2016 0401   PROTEINUR NEGATIVE 09/27/2016 0401   NITRITE NEGATIVE 09/27/2016 0401   LEUKOCYTESUR TRACE (A) 09/27/2016 0401   Time coordinating discharge: Approximately 49 minutes  Vance Gather, MD  [facility] 09/30/2016, 11:06 AM Pager [phone]

## 2016-09-30 NOTE — Care Management Note (Addendum)
Case Management Note  Patient Details  Name: Robin Maynard MRN: [id number] Date of Birth: March 11, 1966  Subjective/Objective:                  epigastric abdominal pain Action/Plan: Discharge planning Expected Discharge Date:  09/30/16               Expected Discharge Plan:  Home/Self Care  In-House Referral:     Discharge planning Services  CM Consult  Post Acute Care Choice:    Choice offered to:  Patient  DME Arranged:    DME Agency:  NA  HH Arranged:  NA HH Agency:  NA  Status of Service:  Completed, signed off  If discussed at [place] of Stay Meetings, dates discussed:    Additional Comments: RN requested I speak with pt concerning FMLA.  CM met with pt to discuss FMLA and explained FMLA is accomplished by [employer] and to contact her Director to get the Link or to go onto [employer].  Pt became very agitated and stated she has already done this and her Director has given her the [place].  Pt said she does not see how her PCP will know all this and CM tried to reassure her [employer] will walk her through the process and how the process works but pt then interupted me and shouted, "When Someone asks your name, you are to give it." My name, position, and reason for coming to her room was given when I entered the room," I gave her my name again and spelled it for her.  Pt demanded I leave; I left.  No other CM needs were communicated. Dellie Catholic, RN 09/30/2016, 11:20 AM

## 2016-10-02 ENCOUNTER — Other Ambulatory Visit: Payer: Self-pay | Admitting: *Deleted

## 2016-10-02 DIAGNOSIS — R1013 Epigastric pain: Secondary | ICD-10-CM | POA: Diagnosis not present

## 2016-10-02 DIAGNOSIS — N83201 Unspecified ovarian cyst, right side: Secondary | ICD-10-CM | POA: Diagnosis not present

## 2016-10-02 DIAGNOSIS — Z800 Family history of malignant neoplasm of digestive organs: Secondary | ICD-10-CM | POA: Diagnosis not present

## 2016-10-02 DIAGNOSIS — R112 Nausea with vomiting, unspecified: Secondary | ICD-10-CM | POA: Diagnosis not present

## 2016-10-02 DIAGNOSIS — R935 Abnormal findings on diagnostic imaging of other abdominal regions, including retroperitoneum: Secondary | ICD-10-CM | POA: Diagnosis not present

## 2016-10-02 DIAGNOSIS — N83202 Unspecified ovarian cyst, left side: Secondary | ICD-10-CM | POA: Diagnosis not present

## 2016-10-02 NOTE — Patient Outreach (Signed)
[facility]) Care Management  10/02/2016  Robin Maynard 1965/09/24 [id number]   Subjective: Telephone call to patient's home / mobile number, no answer, left HIPAA compliant voicemail message, and requested call back.   Objective:  Per chart and KPN point of care tool review, patient hospitalized 09/27/16 - 09/30/16 for Acute pancreatitis.      Assessment: UMR Census report referral received on 09/30/16.    Transition of care follow up pending patient contact.   Plan: RNCM will call patient for 2nd telephone outreach attempt, transition of care follow up, within 10 business days, if no return call.    Doxie Augenstein H. Annia Friendly, BSN, [place] [facility] Telephonic CM Phone: [phone] Fax: [phone]

## 2016-10-03 ENCOUNTER — Other Ambulatory Visit: Payer: Self-pay | Admitting: *Deleted

## 2016-10-03 NOTE — Patient Outreach (Signed)
[facility]) Care Management  10/03/2016  Robin Maynard 01/29/1966 [id number]   Subjective: Telephone call to patient's home / mobile number, no answer, left HIPAA compliant voicemail message, and requested call back.   Objective:  Per chart and KPN point of care tool review, patient hospitalized 09/27/16 - 09/30/16 for Acute pancreatitis.      Assessment: UMR Census report referral received on 09/30/16.    Transition of care follow up pending patient contact.   Plan: RNCM will call patient for 3rd telephone outreach attempt, transition of care follow up, within 10 business days, if no return call.    Robin Maynard H. Annia Friendly, BSN, [place] [facility] Telephonic CM Phone: [phone] Fax: [phone]

## 2016-10-06 ENCOUNTER — Other Ambulatory Visit: Payer: Self-pay | Admitting: Family Medicine

## 2016-10-06 DIAGNOSIS — N83201 Unspecified ovarian cyst, right side: Secondary | ICD-10-CM

## 2016-10-07 ENCOUNTER — Other Ambulatory Visit: Payer: Self-pay | Admitting: *Deleted

## 2016-10-07 ENCOUNTER — Encounter: Payer: Self-pay | Admitting: *Deleted

## 2016-10-07 NOTE — Patient Outreach (Signed)
[facility]) Care Management  10/07/2016  RENNATA SELVAGGIO 07-29-1966 [id number]   Subjective: Telephone call from patient and HIPAA verified.  Discussed [facility] Care Management UMR Transition of care follow up, patient voices understanding, and states she does not wish to participate.  States her follow up appointments are in place, does not want to mix health and employee benefit information.   States she is appreciative of the follow up call and will reach out to [facility] if further assistance needed in the future. Patient states she does not have any transition of care, care coordination, disease management, disease monitoring, transportation, community resource, or pharmacy needs at this time.   Objective: Per chart and KPN point of care tool review, patient hospitalized 09/27/16 - 09/30/16 for Acute pancreatitis.    Assessment:UMR Census report referral received on 09/30/16. Transition of care follow up not completed, patient declined services at this time, and will proceed with case closure.    Plan: RNCM will send case closure due to refusal ofservices at this time request to [facility].     Florentine Diekman H. Annia Friendly, BSN, [place] Management [facility] Telephonic CM Phone: [phone] Fax: [phone]

## 2016-10-07 NOTE — Patient Outreach (Signed)
[facility]) Care Management  10/07/2016  Robin Maynard [birth year] [id number]   Subjective: Received voicemail message from patient, states she is returning call, and requested call back. Telephone call to patient's home / mobile number, no answer, left HIPAA compliant voicemail message, and requested call back.   Objective: Per chart and KPN point of care tool review, patient hospitalized 09/27/16 - 09/30/16 for Acute pancreatitis.    Assessment:UMR Census report referral received on 09/30/16. Transition of care follow up pending patient contact.   Plan: RNCM will send patient unsuccessful outreach letter, [facility] pamphlet, and proceed with case closure within 10 business days, if no return call.    Chayson Charters H. Annia Friendly, BSN, [place] Management [facility] Telephonic CM Phone: [phone] Fax: [phone]

## 2016-10-10 DIAGNOSIS — R1013 Epigastric pain: Secondary | ICD-10-CM | POA: Diagnosis not present

## 2016-10-17 ENCOUNTER — Encounter: Payer: Self-pay | Admitting: Gastroenterology

## 2016-10-24 DIAGNOSIS — H524 Presbyopia: Secondary | ICD-10-CM | POA: Diagnosis not present

## 2016-10-24 DIAGNOSIS — H5213 Myopia, bilateral: Secondary | ICD-10-CM | POA: Diagnosis not present

## 2016-11-03 DIAGNOSIS — N6320 Unspecified lump in the left breast, unspecified quadrant: Secondary | ICD-10-CM | POA: Diagnosis not present

## 2016-11-03 DIAGNOSIS — K219 Gastro-esophageal reflux disease without esophagitis: Secondary | ICD-10-CM | POA: Diagnosis not present

## 2016-11-04 ENCOUNTER — Other Ambulatory Visit: Payer: Self-pay | Admitting: Family Medicine

## 2016-11-04 DIAGNOSIS — N6320 Unspecified lump in the left breast, unspecified quadrant: Secondary | ICD-10-CM

## 2016-11-06 ENCOUNTER — Ambulatory Visit (HOSPITAL_COMMUNITY)
Admission: RE | Admit: 2016-11-06 | Discharge: 2016-11-06 | Disposition: A | Discharge status: Home / Self Care | Payer: 59 | Source: Ambulatory Visit | Attending: Family Medicine | Admitting: Family Medicine

## 2016-11-06 ENCOUNTER — Ambulatory Visit
Admission: RE | Admit: 2016-11-06 | Discharge: 2016-11-06 | Disposition: A | Discharge status: Home / Self Care | Payer: 59 | Source: Ambulatory Visit | Attending: Family Medicine | Admitting: Family Medicine

## 2016-11-06 DIAGNOSIS — N83292 Other ovarian cyst, left side: Secondary | ICD-10-CM | POA: Diagnosis not present

## 2016-11-06 DIAGNOSIS — N83201 Unspecified ovarian cyst, right side: Secondary | ICD-10-CM | POA: Diagnosis present

## 2016-11-06 DIAGNOSIS — N83291 Other ovarian cyst, right side: Secondary | ICD-10-CM | POA: Insufficient documentation

## 2016-11-06 DIAGNOSIS — R928 Other abnormal and inconclusive findings on diagnostic imaging of breast: Secondary | ICD-10-CM | POA: Diagnosis not present

## 2016-11-06 DIAGNOSIS — N83202 Unspecified ovarian cyst, left side: Secondary | ICD-10-CM | POA: Diagnosis not present

## 2016-11-06 DIAGNOSIS — N6320 Unspecified lump in the left breast, unspecified quadrant: Secondary | ICD-10-CM

## 2016-11-06 DIAGNOSIS — N6489 Other specified disorders of breast: Secondary | ICD-10-CM | POA: Diagnosis not present

## 2016-11-14 DIAGNOSIS — N951 Menopausal and female climacteric states: Secondary | ICD-10-CM | POA: Diagnosis not present

## 2016-11-14 MED FILL — PROGESTERONE 100 MG CAPSULE: 100 | 30 days supply | Qty: 30 | Fill #0

## 2016-11-14 MED FILL — SUCRALFATE 1 GM TABLET: 1 | 30 days supply | Qty: 90 | Fill #0

## 2016-11-25 MED FILL — VALACYCLOVIR HCL 500 MG TAB: 500 | 90 days supply | Qty: 90 | Fill #1

## 2016-12-02 ENCOUNTER — Ambulatory Visit (INDEPENDENT_AMBULATORY_CARE_PROVIDER_SITE_OTHER): Payer: 59 | Admitting: Gastroenterology

## 2016-12-02 ENCOUNTER — Encounter: Payer: Self-pay | Admitting: Gastroenterology

## 2016-12-02 VITALS — BP 106/72 | HR 68 | Ht 64.0 in | Wt 172.0 lb

## 2016-12-02 DIAGNOSIS — R938 Abnormal findings on diagnostic imaging of other specified body structures: Secondary | ICD-10-CM

## 2016-12-02 DIAGNOSIS — R9389 Abnormal findings on diagnostic imaging of other specified body structures: Secondary | ICD-10-CM

## 2016-12-02 DIAGNOSIS — R1012 Left upper quadrant pain: Secondary | ICD-10-CM

## 2016-12-02 NOTE — Progress Notes (Signed)
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    Chief complaint is left upper quadrant pain, abnormal CT scan  LUQ pain worse than childbirth.  Was getting flu like symptoms as well.  Radiated to her back, left shoulder.  Mild nausea. Admitted, 2 night stay.  IV fluids, slowly advaced her diet.  Since e/c the pain has not returned nearly as severe.  Still occasionally will feel a twinge, especially after big carb meal.  She took nexium for a long time (previously), but stopped this 5 years ago.  After hosp she took nexium for 2 weeks, then stopped.  Tried dexilant but bad headache.  The severe pains had never occurred except for 15 years ago; had EGD at [facility]  Told her it was gastritis, was on a lot of NSAIDs. The recent  She periodically takes NSAIDs, premenopausal aches, bleeding.  She will take 2 advil periodically but was not on that prior to   Was dieting a lot last fall; has gained weight since hosp d/c  Takes zantac 1-2 times daily  Pyrosis, metalic taste after eating any meal.  Old Data Reviewed:  CT scan Iv and oral contrast 09/2016: 1. Unremarkable pancreas without peripancreatic stranding. There is however mild infiltration of the fat plane surrounding the SMA similar or slightly progressed compared to the prior CT. This finding is nonspecific and may be seen with acute or chronic pancreatitis. Correlation with clinical exam and pancreatic enzymes recommended. 2. No evidence of bowel obstruction or active inflammation. 3. Small bilateral ovarian cysts, likely physiologic.  Labs 09/2016: normal lipase, normal CBC, normal LFTs, HIV negative  [place] 09/2016: normal GB, normal bile duct  She was hospitalized for 1-2 nights back in the end of February 2018. This is for abdominal pain. She was seen by [facility] gastroenterology. From reading their notes it  was not clear what the etiology of her pain was. She improved without any specific therapies except IV fluids. She did not want to see them in follow-up.   Review of systems: Pertinent positive and negative review of systems were noted in the above HPI section. Complete review of systems was performed and was otherwise normal.   Past Medical History:  Diagnosis Date  . Acute pancreatitis   . Constipation   . Gastritis   . GERD (gastroesophageal reflux disease)   . Obesity     Past Surgical History:  Procedure Laterality Date  . TONSILLECTOMY AND ADENOIDECTOMY      Current Outpatient Prescriptions  Medication Sig Dispense Refill  . ondansetron (ZOFRAN) 4 MG tablet Take 1 tablet (4 mg total) by mouth every 8 (eight) hours as needed for nausea. 12 tablet 0  . ranitidine (ZANTAC) 150 MG capsule Take 150 mg by mouth as needed for heartburn.    . valACYclovir (VALTREX) 500 MG tablet Take 500 mg by mouth daily as needed (breakout).     Marland Kitchen esomeprazole (NEXIUM) 20 MG capsule Take 2 capsules (40 mg total) by mouth daily at 12 noon. (Patient not taking: Reported on 12/02/2016) 30 capsule 0   No current facility-administered medications for this visit.     Allergies as of 12/02/2016  . (No Known Allergies)    Family History  Problem Relation Age of Onset  . Gastric cancer Father   . Diabetes Father   .  Heart disease Father   . Hypertension Father   . Deep vein thrombosis Mother   . Colon polyps Mother   . Heart disease Mother   . Diabetes Paternal Grandmother   . Heart disease Paternal Grandmother   . Breast cancer Maternal Aunt     Social History   Social History  . Marital status: Married    Spouse name: N/A  . Number of children: N/A  . Years of education: N/A   Occupational History  . Not on file.   Social History Main Topics  . Smoking status: Never Smoker  . Smokeless tobacco: Never Used  . Alcohol use No  . Drug use: No  . Sexual activity: Not on file    Other Topics Concern  . Not on file   Social History Narrative  . No narrative on file     Physical Exam: BP 106/72   Pulse 68   Ht 5\' 4"  (1.626 m)   Wt 172 lb (78 kg)   BMI 29.52 kg/m  Constitutional: generally well-appearing Psychiatric: alert and oriented x3 Eyes: extraocular movements intact Mouth: oral pharynx moist, no lesions Neck: supple no lymphadenopathy Cardiovascular: heart regular rate and rhythm Lungs: clear to auscultation bilaterally Abdomen: soft, nontender, nondistended, no obvious ascites, no peritoneal signs, normal bowel sounds Extremities: no lower extremity edema bilaterally Skin: no lesions on visible extremities   Assessment and plan: 51 y.o. female with  left upper quadrant pain, abnormal CT scan with haziness around her SMA  The radiologist who read her most recent CT scan said that the headache dizziness around her SMA was similar or perhaps progressed since her previous CT scan which was 13 years ago. I told her that report and there is no mention of haziness around the SMA. I am not able to access the actual images from the 2005 CT scan. I think it is important to know whether there is still a process around this blood vessel. I recommended repeat CT scan of the abdomen with IV and oral contrast as the first best test. If this is cleared that it was probably transient, possibly viral related related, possibly gastritis related. If that is the case are proceed with simple EGD for her persistent left upper quadrant ache and metallic taste with pyrosis. If the haziness is still there I would like to image her stomach with endoscopic ultrasound instead. I see no reason for any further blood tests or imaging studies other than those mentioned above.    Please see the "Patient Instructions" section for addition details about the plan.   Owens Loffler, MD [facility] 12/02/2016, 11:04 AM  Cc: Orpah Melter, MD

## 2016-12-02 NOTE — Patient Instructions (Addendum)
You will be set up for a CT scan of abdomen with IV and oral contrast for abnormal SMA.    You have been scheduled for a CT scan of the abdomen and pelvis at [facility] ([address]---this is in the same building as [facility]).   You are scheduled on 12/11/16 at 1130 am. You should arrive 15 minutes prior to your appointment time for registration. Please follow the written instructions below on the day of your exam:  WARNING: IF YOU ARE ALLERGIC TO IODINE/X-RAY DYE, PLEASE NOTIFY RADIOLOGY IMMEDIATELY AT [phone]! YOU WILL BE GIVEN A 13 HOUR PREMEDICATION PREP.  1) Do not eat or drink anything after 930 am (2 hours prior to your test) 2) You have been given 2 bottles of oral contrast to drink. The solution may taste better if refrigerated, but do NOT add ice or any other liquid to this solution. Shake well before drinking.    Drink 1 bottle of contrast @ 1030 am (2 hours prior to your exam)   You may take any medications as prescribed with a small amount of water except for the following: Metformin, Glucophage, Glucovance, Avandamet, Riomet, Fortamet, Actoplus Met, Janumet, Glumetza or Metaglip. The above medications must be held the day of the exam AND 48 hours after the exam.  The purpose of you drinking the oral contrast is to aid in the visualization of your intestinal tract. The contrast solution may cause some diarrhea. Before your exam is started, you will be given a small amount of fluid to drink. Depending on your individual set of symptoms, you may also receive an intravenous injection of x-ray contrast/dye. Plan on being at [facility] for 30 minutes or longer, depending on the type of exam you are having performed.  This test typically takes 30-45 minutes to complete.  If you have any questions regarding your exam or if you need to reschedule, you may call the CT department at [phone] between the hours of 8:00 am and 5:00 pm,  Monday-Friday.  ________________________________________________________   Will decide EGD vs EUS after reviewing CT. 

## 2016-12-04 NOTE — H&P (Signed)
51 year old female with irregular periods that are very heavy   Past Medical History:  Diagnosis Date  . Acute pancreatitis   . Constipation   . Gastritis   . GERD (gastroesophageal reflux disease)   . Obesity    Past Surgical History:  Procedure Laterality Date  . TONSILLECTOMY AND ADENOIDECTOMY     Prior to Admission medications   Medication Sig Start Date End Date Taking? Authorizing Provider  esomeprazole (NEXIUM) 20 MG capsule Take 2 capsules (40 mg total) by mouth daily at 12 noon. Patient not taking: Reported on 12/02/2016 09/30/16   Patrecia Pour, MD  ondansetron (ZOFRAN) 4 MG tablet Take 1 tablet (4 mg total) by mouth every 8 (eight) hours as needed for nausea. 09/24/16   Katy Apo, NP  ranitidine (ZANTAC) 150 MG capsule Take 150 mg by mouth as needed for heartburn.    Historical Provider, MD  valACYclovir (VALTREX) 500 MG tablet Take 500 mg by mouth daily as needed (breakout).     Historical Provider, MD   Family History  Problem Relation Age of Onset  . Gastric cancer Father   . Diabetes Father   . Heart disease Father   . Hypertension Father   . Deep vein thrombosis Mother   . Colon polyps Mother   . Heart disease Mother   . Diabetes Paternal Grandmother   . Heart disease Paternal Grandmother   . Breast cancer Maternal Aunt    Social History   Social History  . Marital status: Married    Spouse name: N/A  . Number of children: N/A  . Years of education: N/A   Social History Main Topics  . Smoking status: Never Smoker  . Smokeless tobacco: Never Used  . Alcohol use No  . Drug use: No  . Sexual activity: Not on file   Other Topics Concern  . Not on file   Social History Narrative  . No narrative on file   Allergies: Patient has no known allergies.  General alert and oriented Lung CTAB Car RRR Abdomen  Pelvic small uterus  IMPRESSION: Menorrhagia  PLAN: HSC D and C HTA Risks reviewed

## 2016-12-11 ENCOUNTER — Ambulatory Visit (INDEPENDENT_AMBULATORY_CARE_PROVIDER_SITE_OTHER)
Admission: RE | Admit: 2016-12-11 | Discharge: 2016-12-11 | Disposition: A | Discharge status: Home / Self Care | Payer: 59 | Source: Ambulatory Visit | Attending: Gastroenterology | Admitting: Gastroenterology

## 2016-12-11 DIAGNOSIS — R9389 Abnormal findings on diagnostic imaging of other specified body structures: Secondary | ICD-10-CM

## 2016-12-11 DIAGNOSIS — R938 Abnormal findings on diagnostic imaging of other specified body structures: Secondary | ICD-10-CM

## 2016-12-11 DIAGNOSIS — R935 Abnormal findings on diagnostic imaging of other abdominal regions, including retroperitoneum: Secondary | ICD-10-CM | POA: Diagnosis not present

## 2016-12-11 DIAGNOSIS — N920 Excessive and frequent menstruation with regular cycle: Secondary | ICD-10-CM | POA: Diagnosis not present

## 2016-12-11 MED ORDER — IOPAMIDOL (ISOVUE-300) INJECTION 61%
100.0000 mL | Freq: Once | INTRAVENOUS | Status: AC | PRN
  Administered 2016-12-11: 100 mL via INTRAVENOUS

## 2016-12-18 ENCOUNTER — Encounter (HOSPITAL_BASED_OUTPATIENT_CLINIC_OR_DEPARTMENT_OTHER): Payer: Self-pay | Admitting: *Deleted

## 2016-12-18 NOTE — Progress Notes (Signed)
To [facility] at 0600-Npo after Mn- Pt requests we ask Dr Helane Rima if CBC needed.Message left with Heather about Hg vs CBC.

## 2016-12-19 NOTE — Progress Notes (Signed)
Message from [place] at Dr Christen Butter office-OK to obtain Hg instead of CBC day of procedure.

## 2016-12-26 ENCOUNTER — Ambulatory Visit (HOSPITAL_BASED_OUTPATIENT_CLINIC_OR_DEPARTMENT_OTHER): Payer: 59 | Admitting: Anesthesiology

## 2016-12-26 ENCOUNTER — Ambulatory Visit (HOSPITAL_BASED_OUTPATIENT_CLINIC_OR_DEPARTMENT_OTHER)
Admission: RE | Admit: 2016-12-26 | Discharge: 2016-12-26 | Disposition: A | Discharge status: Home / Self Care | Payer: 59 | Source: Ambulatory Visit | Attending: Obstetrics and Gynecology | Admitting: Obstetrics and Gynecology

## 2016-12-26 ENCOUNTER — Encounter (HOSPITAL_BASED_OUTPATIENT_CLINIC_OR_DEPARTMENT_OTHER): Payer: Self-pay

## 2016-12-26 ENCOUNTER — Encounter (HOSPITAL_BASED_OUTPATIENT_CLINIC_OR_DEPARTMENT_OTHER)
Admission: RE | Disposition: A | Discharge status: Home / Self Care | Payer: Self-pay | Source: Ambulatory Visit | Attending: Obstetrics and Gynecology

## 2016-12-26 DIAGNOSIS — Z8249 Family history of ischemic heart disease and other diseases of the circulatory system: Secondary | ICD-10-CM | POA: Insufficient documentation

## 2016-12-26 DIAGNOSIS — K219 Gastro-esophageal reflux disease without esophagitis: Secondary | ICD-10-CM | POA: Insufficient documentation

## 2016-12-26 DIAGNOSIS — K859 Acute pancreatitis without necrosis or infection, unspecified: Secondary | ICD-10-CM | POA: Diagnosis not present

## 2016-12-26 DIAGNOSIS — R1013 Epigastric pain: Secondary | ICD-10-CM | POA: Diagnosis not present

## 2016-12-26 DIAGNOSIS — Z79899 Other long term (current) drug therapy: Secondary | ICD-10-CM | POA: Insufficient documentation

## 2016-12-26 DIAGNOSIS — Z8371 Family history of colonic polyps: Secondary | ICD-10-CM | POA: Diagnosis not present

## 2016-12-26 DIAGNOSIS — N920 Excessive and frequent menstruation with regular cycle: Secondary | ICD-10-CM | POA: Insufficient documentation

## 2016-12-26 DIAGNOSIS — Z833 Family history of diabetes mellitus: Secondary | ICD-10-CM | POA: Diagnosis not present

## 2016-12-26 DIAGNOSIS — Z800 Family history of malignant neoplasm of digestive organs: Secondary | ICD-10-CM | POA: Diagnosis not present

## 2016-12-26 DIAGNOSIS — Z803 Family history of malignant neoplasm of breast: Secondary | ICD-10-CM | POA: Insufficient documentation

## 2016-12-26 HISTORY — PX: DILITATION & CURRETTAGE/HYSTROSCOPY WITH HYDROTHERMAL ABLATION: SHX5570

## 2016-12-26 LAB — POCT HEMOGLOBIN-HEMACUE: Hemoglobin: 14.7 g/dL (ref 12.0–15.0)

## 2016-12-26 SURGERY — DILATATION & CURETTAGE/HYSTEROSCOPY WITH HYDROTHERMAL ABLATION
Anesthesia: General

## 2016-12-26 MED ORDER — FENTANYL CITRATE (PF) 100 MCG/2ML IJ SOLN
INTRAMUSCULAR | Status: DC | PRN
  Administered 2016-12-26 (×2): 25 ug via INTRAVENOUS

## 2016-12-26 MED ORDER — FENTANYL CITRATE (PF) 100 MCG/2ML IJ SOLN
25.0000 ug | INTRAMUSCULAR | Status: DC | PRN
  Filled 2016-12-26: qty 1

## 2016-12-26 MED ORDER — KETOROLAC TROMETHAMINE 30 MG/ML IJ SOLN
INTRAMUSCULAR | Status: AC
  Filled 2016-12-26: qty 1

## 2016-12-26 MED ORDER — LIDOCAINE 2% (20 MG/ML) 5 ML SYRINGE
INTRAMUSCULAR | Status: DC | PRN
  Administered 2016-12-26: 60 mg via INTRAVENOUS

## 2016-12-26 MED ORDER — OXYCODONE HCL 5 MG/5ML PO SOLN
5.0000 mg | Freq: Once | ORAL | Status: DC | PRN
  Filled 2016-12-26: qty 5

## 2016-12-26 MED ORDER — CEFOTETAN DISODIUM-DEXTROSE 2-2.08 GM-% IV SOLR
INTRAVENOUS | Status: AC
Start: 2016-12-26 — End: 2016-12-26
  Filled 2016-12-26: qty 50

## 2016-12-26 MED ORDER — ONDANSETRON HCL 4 MG/2ML IJ SOLN
INTRAMUSCULAR | Status: AC
  Filled 2016-12-26: qty 2

## 2016-12-26 MED ORDER — DEXAMETHASONE SODIUM PHOSPHATE 10 MG/ML IJ SOLN
INTRAMUSCULAR | Status: AC
  Filled 2016-12-26: qty 1

## 2016-12-26 MED ORDER — EPHEDRINE 5 MG/ML INJ
INTRAVENOUS | Status: AC
  Filled 2016-12-26: qty 10

## 2016-12-26 MED ORDER — SCOPOLAMINE 1 MG/3DAYS TD PT72
MEDICATED_PATCH | TRANSDERMAL | Status: AC
  Filled 2016-12-26: qty 1

## 2016-12-26 MED ORDER — ONDANSETRON HCL 4 MG/2ML IJ SOLN
4.0000 mg | Freq: Four times a day (QID) | INTRAMUSCULAR | Status: DC | PRN
  Filled 2016-12-26: qty 2

## 2016-12-26 MED ORDER — LIDOCAINE HCL 1 % IJ SOLN
INTRAMUSCULAR | Status: DC | PRN
  Administered 2016-12-26: 10 mL

## 2016-12-26 MED ORDER — ONDANSETRON HCL 4 MG/2ML IJ SOLN
INTRAMUSCULAR | Status: DC | PRN
  Administered 2016-12-26: 4 mg via INTRAVENOUS

## 2016-12-26 MED ORDER — LACTATED RINGERS IV SOLN
INTRAVENOUS | Status: DC
  Administered 2016-12-26: 09:00:00 via INTRAVENOUS
  Filled 2016-12-26: qty 1000

## 2016-12-26 MED ORDER — PROPOFOL 10 MG/ML IV BOLUS
INTRAVENOUS | Status: AC
  Filled 2016-12-26: qty 40

## 2016-12-26 MED ORDER — MIDAZOLAM HCL 5 MG/5ML IJ SOLN
INTRAMUSCULAR | Status: DC | PRN
  Administered 2016-12-26: 1 mg via INTRAVENOUS

## 2016-12-26 MED ORDER — MIDAZOLAM HCL 2 MG/2ML IJ SOLN
INTRAMUSCULAR | Status: AC
  Filled 2016-12-26: qty 2

## 2016-12-26 MED ORDER — OXYCODONE HCL 5 MG PO TABS
5.0000 mg | ORAL_TABLET | Freq: Once | ORAL | Status: DC | PRN
  Filled 2016-12-26: qty 1

## 2016-12-26 MED ORDER — LACTATED RINGERS IV SOLN
INTRAVENOUS | Status: DC
  Administered 2016-12-26: 07:00:00 via INTRAVENOUS
  Filled 2016-12-26: qty 1000

## 2016-12-26 MED ORDER — OXYCODONE-ACETAMINOPHEN 10-325 MG PO TABS: 1.0000 | ORAL_TABLET | ORAL | 0 refills | Status: DC | PRN

## 2016-12-26 MED ORDER — SCOPOLAMINE 1 MG/3DAYS TD PT72
MEDICATED_PATCH | TRANSDERMAL | Status: DC | PRN
  Administered 2016-12-26: 1 via TRANSDERMAL

## 2016-12-26 MED ORDER — KETOROLAC TROMETHAMINE 30 MG/ML IJ SOLN
INTRAMUSCULAR | Status: DC | PRN
  Administered 2016-12-26: 30 mg via INTRAVENOUS

## 2016-12-26 MED ORDER — EPHEDRINE SULFATE-NACL 50-0.9 MG/10ML-% IV SOSY
PREFILLED_SYRINGE | INTRAVENOUS | Status: DC | PRN
  Administered 2016-12-26 (×2): 10 mg via INTRAVENOUS

## 2016-12-26 MED ORDER — CEFOTETAN DISODIUM 2 G IJ SOLR
2.0000 g | INTRAMUSCULAR | Status: AC
  Administered 2016-12-26: 2 g via INTRAVENOUS
  Filled 2016-12-26: qty 2

## 2016-12-26 MED ORDER — FENTANYL CITRATE (PF) 100 MCG/2ML IJ SOLN
INTRAMUSCULAR | Status: AC
  Filled 2016-12-26: qty 2

## 2016-12-26 MED ORDER — PROPOFOL 10 MG/ML IV BOLUS
INTRAVENOUS | Status: DC | PRN
  Administered 2016-12-26: 200 mg via INTRAVENOUS

## 2016-12-26 MED ORDER — DEXAMETHASONE SODIUM PHOSPHATE 4 MG/ML IJ SOLN
INTRAMUSCULAR | Status: DC | PRN
  Administered 2016-12-26: 10 mg via INTRAVENOUS

## 2016-12-26 MED FILL — OXYCODONE-ACETAMINOPHEN 10-: 10-325 | 2 days supply | Qty: 10 | Fill #0

## 2016-12-26 MED FILL — IBUPROFEN 600 MG TABLET: 600 | 7 days supply | Qty: 30 | Fill #0

## 2016-12-26 SURGICAL SUPPLY — 15 items
CATH ROBINSON RED A/P 16FR (CATHETERS) ×2 IMPLANT
CLOTH BEACON ORANGE TIMEOUT ST (SAFETY) ×4 IMPLANT
COUNTER NEEDLE 1200 MAGNETIC (NEEDLE) ×1 IMPLANT
DILATOR CANAL MILEX (MISCELLANEOUS) IMPLANT
GLOVE BIO SURGEON STRL SZ 6.5 (GLOVE) ×4 IMPLANT
GOWN STRL REUS W/TWL LRG LVL3 (GOWN DISPOSABLE) ×2 IMPLANT
IV NS IRRIG 3000ML ARTHROMATIC (IV SOLUTION) ×2 IMPLANT
KIT RM TURNOVER CYSTO AR (KITS) ×2 IMPLANT
NDL SPNL 25GX3.5 QUINCKE BL (NEEDLE) ×1 IMPLANT
NEEDLE SPNL 25GX3.5 QUINCKE BL (NEEDLE) ×2 IMPLANT
PACK VAGINAL MINOR WOMEN LF (CUSTOM PROCEDURE TRAY) ×4 IMPLANT
PAD OB MATERNITY 4.3X12.25 (PERSONAL CARE ITEMS) ×2 IMPLANT
SET GENESYS HTA PROCERVA (MISCELLANEOUS) ×1 IMPLANT
TOWEL OR 17X24 6PK STRL BLUE (TOWEL DISPOSABLE) ×4 IMPLANT
WATER STERILE IRR 500ML POUR (IV SOLUTION) ×1 IMPLANT

## 2016-12-26 NOTE — Progress Notes (Signed)
Patiient doing well. BP 109/65   Pulse 75   Temp 98.3 F (36.8 C) (Oral)   Resp 16   Ht 5\' 4"  (1.626 m)   Wt 77.6 kg (171 lb)   LMP 12/08/2016 Comment: irregular bleeding  SpO2 97%   BMI 29.35 kg/m  H and P on the chart  Consent signed

## 2016-12-26 NOTE — Discharge Instructions (Signed)
°  Post Anesthesia Home Care Instructions ° °Activity: °Get plenty of rest for the remainder of the day. A responsible individual must stay with you for 24 hours following the procedure.  °For the next 24 hours, DO NOT: °-Drive a car °-Operate machinery °-Drink alcoholic beverages °-Take any medication unless instructed by your physician °-Make any legal decisions or sign important papers. ° °Meals: °Start with liquid foods such as gelatin or soup. Progress to regular foods as tolerated. Avoid greasy, spicy, heavy foods. If nausea and/or vomiting occur, drink only clear liquids until the nausea and/or vomiting subsides. Call your physician if vomiting continues. ° °Special Instructions/Symptoms: °Your throat may feel dry or sore from the anesthesia or the breathing tube placed in your throat during surgery. If this causes discomfort, gargle with warm salt water. The discomfort should disappear within 24 hours. ° °If you had a scopolamine patch placed behind your ear for the management of post- operative nausea and/or vomiting: ° °1. The medication in the patch is effective for 72 hours, after which it should be removed.  Wrap patch in a tissue and discard in the trash. Wash hands thoroughly with soap and water. °2. You may remove the patch earlier than 72 hours if you experience unpleasant side effects which may include dry mouth, dizziness or visual disturbances. °3. Avoid touching the patch. Wash your hands with soap and water after contact with the patch. °    ° °D & C Home care Instructions: ° ° °Personal hygiene:  Used sanitary napkins for vaginal drainage not tampons. Shower or tub bathe the day after your procedure. No douching until bleeding stops. Always wipe from front to back after  Elimination. ° °Activity: Do not drive or operate any equipment today. The effects of the anesthesia are still present and drowsiness may result. Rest today, not necessarily flat bed rest, just take it easy. You may resume your  normal activity in one to 2 days. ° °Sexual activity: No intercourse for one week or as indicated by your physician ° °Diet: Eat a light diet as desired this evening. You may resume a regular diet tomorrow. ° °Return to work: One to 2 days. ° °General Expectations of your surgery: Vaginal bleeding should be no heavier than a normal period. Spotting may continue up to 10 days. Mild cramps may continue for a couple of days. You may have a regular period in 2-6 weeks. ° °Unexpected observations call your doctor if these occur: persistent or heavy bleeding. Severe abdominal cramping or pain. Elevation of temperature greater than 100°F. ° °Call for an appointment in one week. ° ° ° °Patient's Signature_______________________________________________________ ° °Nurse's Signature________________________________________________________  °

## 2016-12-26 NOTE — Anesthesia Postprocedure Evaluation (Signed)
Anesthesia Post Note  Patient: Robin Maynard  Procedure(s) Performed: Procedure(s) (LRB): DILATATION & CURETTAGE/HYSTEROSCOPY WITH HYDROTHERMAL ABLATION (N/A)  Patient location during evaluation: PACU Anesthesia Type: General Level of consciousness: awake and alert and patient cooperative Pain management: pain level controlled Vital Signs Assessment: post-procedure vital signs reviewed and stable Respiratory status: spontaneous breathing and respiratory function stable Cardiovascular status: stable Anesthetic complications: no       Last Vitals:  Vitals:   12/26/16 0845 12/26/16 1033  BP: 121/70 127/74  Pulse: 72 70  Resp: 14 16  Temp:  36.6 C    Last Pain:  Vitals:   12/26/16 1033  TempSrc: Oral  PainSc:                  [place]

## 2016-12-26 NOTE — Transfer of Care (Signed)
Immediate Anesthesia Transfer of Care Note  Patient: Robin Maynard  Procedure(s) Performed: Procedure(s): DILATATION & CURETTAGE/HYSTEROSCOPY WITH HYDROTHERMAL ABLATION (N/A)  Patient Location: PACU  Anesthesia Type:General  Level of Consciousness: awake, alert , oriented and patient cooperative  Airway & Oxygen Therapy: Patient Spontanous Breathing and Patient connected to nasal cannula oxygen  Post-op Assessment: Report given to RN and Post -op Vital signs reviewed and stable  Post vital signs: Reviewed and stable  Last Vitals:  Vitals:   12/26/16 0630  BP: 109/65  Pulse: 75  Resp: 16  Temp: 36.8 C    Last Pain:  Vitals:   12/26/16 0630  TempSrc: Oral      Patients Stated Pain Goal: 5 (24/26/83 4196)  Complications: No apparent anesthesia complications

## 2016-12-26 NOTE — Anesthesia Preprocedure Evaluation (Addendum)
Anesthesia Evaluation  Patient identified by MRN, date of birth, ID band Patient awake    Reviewed: Allergy & Precautions, H&P , NPO status , Patient's Chart, lab work & pertinent test results  Airway Mallampati: II  TM Distance: >3 FB Neck ROM: full    Dental  (+) Teeth Intact, Dental Advisory Given,    Pulmonary neg pulmonary ROS,    breath sounds clear to auscultation       Cardiovascular negative cardio ROS   Rhythm:regular Rate:Normal     Neuro/Psych    GI/Hepatic GERD  ,  Endo/Other    Renal/GU      Musculoskeletal   Abdominal   Peds  Hematology   Anesthesia Other Findings   Reproductive/Obstetrics                            Anesthesia Physical Anesthesia Plan  ASA: II  Anesthesia Plan: General   Post-op Pain Management:    Induction: Intravenous  Airway Management Planned: LMA  Additional Equipment:   Intra-op Plan:   Post-operative Plan:   Informed Consent: I have reviewed the patients History and Physical, chart, labs and discussed the procedure including the risks, benefits and alternatives for the proposed anesthesia with the patient or authorized representative who has indicated his/her understanding and acceptance.     Plan Discussed with: CRNA, Anesthesiologist and Surgeon  Anesthesia Plan Comments:         Anesthesia Quick Evaluation

## 2016-12-26 NOTE — Op Note (Signed)
NAMEREMI, RESTER             ACCOUNT NO.:  [id number]  MEDICAL RECORD NO.:  [id number]  LOCATION:                                 FACILITY:  PHYSICIAN:  Angeles Paolucci L. Johanna Stafford, M.D.DATE OF BIRTH:  1966/05/11  DATE OF PROCEDURE: DATE OF DISCHARGE:                              OPERATIVE REPORT   PREOPERATIVE DIAGNOSIS:  Menorrhagia.  POSTOPERATIVE DIAGNOSIS:  Menorrhagia.  PROCEDURE:  Hysteroscopy, D and C, and HTA.  SURGEON:  Donterius Filley L. Helane Rima, MD.  ANESTHESIA:  MAC with paracevical.  ESTIMATED BLOOD LOSS:  Minimal.  COMPLICATIONS:  None.  PATHOLOGY:  Uterine curettings.  DESCRIPTION OF PROCEDURE:  The patient was taken to the operating room. Her anesthesia was administered.  She was prepped and draped and in and out catheter was used to empty the bladder.  A time-out was performed. A speculum was inserted.  The cervix was grasped with a tenaculum and a paracervical block was performed and the cervical internal os was gently dilated.  The curette was inserted.  The uterus was thoroughly curetted of all tissue and sent to pathology.  The hysteroscope was inserted. The endometrium did appear to be shaggy.  The cavity was appeared to be normal.  We did a fluid check and there was 1 mL fluid deficit.  We then proceeded with HTA.  During the HTA, there was no fluid leakage.  We had good distention, good visibility and we had a 10-minute cycle and ablation appeared to be complete.  At the end of the procedure, the instruments were removed from the vagina.  All sponge, lap, and instrument counts were correct x2.  The patient went to recovery room in stable condition.     Germain Koopmann L. Helane Rima, M.D.     Nevin Bloodgood  D:  12/26/2016  T:  12/26/2016  Job:  [id number]

## 2016-12-26 NOTE — Brief Op Note (Signed)
12/26/2016  8:10 AM  PATIENT:  Robin Maynard  51 y.o. female  PRE-OPERATIVE DIAGNOSIS:  Menorrhagia  POST-OPERATIVE DIAGNOSIS:   Menorrhagia  PROCEDURE:  Procedure(s): DILATATION & CURETTAGE/HYSTEROSCOPY WITH HYDROTHERMAL ABLATION (N/A)  SURGEON:  Surgeon(s) and Role:    * Dian Queen, MD - Primary  PHYSICIAN ASSISTANT:   ASSISTANTS: none   ANESTHESIA:   paracervical block and MAC  EBL:  Total I/O In: 550 [I.V.:550] Out: -   BLOOD ADMINISTERED:none  DRAINS: none   LOCAL MEDICATIONS USED:  LIDOCAINE   SPECIMEN:  Source of Specimen:  uterine curettings  DISPOSITION OF SPECIMEN:  PATHOLOGY  COUNTS:  YES  TOURNIQUET:  * No tourniquets in log *  DICTATION: .Other Dictation: Dictation Number [id number]  PLAN OF CARE: Discharge to home after PACU  PATIENT DISPOSITION:  PACU - hemodynamically stable.   Delay start of Pharmacological VTE agent (>24hrs) due to surgical blood loss or risk of bleeding: not applicable

## 2016-12-26 NOTE — Anesthesia Procedure Notes (Addendum)
Procedure Name: LMA Insertion Date/Time: 12/26/2016 7:33 AM Performed by: Wanita Chamberlain Pre-anesthesia Checklist: Patient identified, Timeout performed, Emergency Drugs available, Suction available and Patient being monitored Patient Re-evaluated:Patient Re-evaluated prior to inductionOxygen Delivery Method: Circle system utilized Preoxygenation: Pre-oxygenation with 100% oxygen Intubation Type: IV induction Ventilation: Mask ventilation without difficulty LMA: LMA inserted LMA Size: 4.0 Number of attempts: 1 Airway Equipment and Method: Bite block Placement Confirmation: breath sounds checked- equal and bilateral and positive ETCO2 Tube secured with: Tape Dental Injury: Teeth and Oropharynx as per pre-operative assessment

## 2016-12-30 ENCOUNTER — Encounter (HOSPITAL_BASED_OUTPATIENT_CLINIC_OR_DEPARTMENT_OTHER): Payer: Self-pay | Admitting: Obstetrics and Gynecology

## 2017-01-08 ENCOUNTER — Ambulatory Visit: Payer: 59

## 2017-01-08 VITALS — Ht 64.0 in | Wt 175.0 lb

## 2017-01-08 DIAGNOSIS — R9389 Abnormal findings on diagnostic imaging of other specified body structures: Secondary | ICD-10-CM

## 2017-01-08 NOTE — Progress Notes (Signed)
No allergies to eggs or soy No diet meds No home oxygen No past problems with anesthesia  Registered emmi 

## 2017-01-09 ENCOUNTER — Encounter: Payer: Self-pay | Admitting: Gastroenterology

## 2017-01-14 ENCOUNTER — Telehealth: Payer: Self-pay | Admitting: Gastroenterology

## 2017-01-14 NOTE — Telephone Encounter (Signed)
The pt states it is ridiculous that her husband has to "waste" 3 hours waiting for her to have her procedure in the [facility].  She wants to move it to [facility].  She states the we are not patient friendly and no other facility has this policy.  I advised the pt I would send the message to Dr Ardis Hughs to see if we can move the case to [facility] and that July 12 is the next available.  She states "July is not good for me"  I offered August and she said she would call her husband and see if he would reconsider staying with her in the [facility].  She will call back and let me know what she decides.

## 2017-01-15 NOTE — Telephone Encounter (Signed)
Noted  

## 2017-01-20 ENCOUNTER — Ambulatory Visit (AMBULATORY_SURGERY_CENTER): Payer: 59 | Admitting: Gastroenterology

## 2017-01-20 ENCOUNTER — Encounter: Payer: Self-pay | Admitting: Gastroenterology

## 2017-01-20 VITALS — BP 112/72 | HR 60 | Temp 98.6°F | Resp 17 | Ht 64.0 in | Wt 175.0 lb

## 2017-01-20 DIAGNOSIS — K2950 Unspecified chronic gastritis without bleeding: Secondary | ICD-10-CM

## 2017-01-20 DIAGNOSIS — K2970 Gastritis, unspecified, without bleeding: Secondary | ICD-10-CM

## 2017-01-20 DIAGNOSIS — R938 Abnormal findings on diagnostic imaging of other specified body structures: Secondary | ICD-10-CM | POA: Diagnosis present

## 2017-01-20 DIAGNOSIS — K2900 Acute gastritis without bleeding: Secondary | ICD-10-CM

## 2017-01-20 DIAGNOSIS — R9389 Abnormal findings on diagnostic imaging of other specified body structures: Secondary | ICD-10-CM

## 2017-01-20 MED ORDER — SODIUM CHLORIDE 0.9 % IV SOLN: 500.0000 mL | INTRAVENOUS | Status: DC

## 2017-01-20 NOTE — Progress Notes (Signed)
Pt's states no medical or surgical changes since previsit or office visit. 

## 2017-01-20 NOTE — Op Note (Signed)
[facility] Patient Name: Robin Maynard Procedure Date: 01/20/2017 9:36 AM MRN: [id number] Endoscopist: Milus Banister , MD Age: 51 Referring MD:  Date of Birth: March 14, 1966 Gender: Female Account #: [id number] Procedure:                Upper GI endoscopy Indications:              Abdominal pain in the left upper quadrant Medicines:                Monitored Anesthesia Care Procedure:                Pre-Anesthesia Assessment:                           - Prior to the procedure, a History and Physical                            was performed, and patient medications and                            allergies were reviewed. The patient's tolerance of                            previous anesthesia was also reviewed. The risks                            and benefits of the procedure and the sedation                            options and risks were discussed with the patient.                            All questions were answered, and informed consent                            was obtained. Prior Anticoagulants: The patient has                            taken no previous anticoagulant or antiplatelet                            agents. ASA Grade Assessment: II - A patient with                            mild systemic disease. After reviewing the risks                            and benefits, the patient was deemed in                            satisfactory condition to undergo the procedure.                           After obtaining informed consent, the endoscope was  passed under direct vision. Throughout the                            procedure, the patient's blood pressure, pulse, and                            oxygen saturations were monitored continuously. The                            Endoscope was introduced through the mouth, and                            advanced to the second part of duodenum. The upper                            GI  endoscopy was accomplished without difficulty.                            The patient tolerated the procedure well. Scope In: Scope Out: Findings:                 The esophagus was normal.                           Minimal inflammation characterized by erythema was                            found in the gastric antrum. Biopsies were taken                            with a cold forceps for histology.                           The examined duodenum was normal. Complications:            No immediate complications. Estimated blood loss:                            None. Estimated Blood Loss:     Estimated blood loss: none. Impression:               - Normal esophagus.                           - Gastritis. Biopsied.                           - Normal examined duodenum. Recommendation:           - Patient has a contact number available for                            emergencies. The signs and symptoms of potential                            delayed complications were discussed with the  patient. Return to normal activities tomorrow.                            Written discharge instructions were provided to the                            patient.                           - Resume previous diet.                           - Continue present medications.                           - Await pathology results and will treat it + for                            H. pylori.                           - Will ask Dr. Donnetta Hutching to review your 2 recent CT                            scans to comment on SMA hazziness (which has nearly                            completely improved). Milus Banister, MD 01/20/2017 9:50:45 AM This report has been signed electronically.

## 2017-01-20 NOTE — Progress Notes (Signed)
Called to room to assist during endoscopic procedure.  Patient ID and intended procedure confirmed with present staff. Received instructions for my participation in the procedure from the performing physician.  

## 2017-01-20 NOTE — Patient Instructions (Signed)
Discharge instructions given. Biopsies taken. Resume previous medications. YOU HAD AN ENDOSCOPIC PROCEDURE TODAY AT THE [facility]:   Refer to the procedure report that was given to you for any specific questions about what was found during the examination.  If the procedure report does not answer your questions, please call your gastroenterologist to clarify.  If you requested that your care partner not be given the details of your procedure findings, then the procedure report has been included in a sealed envelope for you to review at your convenience later.  YOU SHOULD EXPECT: Some feelings of bloating in the abdomen. Passage of more gas than usual.  Walking can help get rid of the air that was put into your GI tract during the procedure and reduce the bloating. If you had a lower endoscopy (such as a colonoscopy or flexible sigmoidoscopy) you may notice spotting of blood in your stool or on the toilet paper. If you underwent a bowel prep for your procedure, you may not have a normal bowel movement for a few days.  Please Note:  You might notice some irritation and congestion in your nose or some drainage.  This is from the oxygen used during your procedure.  There is no need for concern and it should clear up in a day or so.  SYMPTOMS TO REPORT IMMEDIATELY:   Following upper endoscopy (EGD)  Vomiting of blood or coffee ground material  New chest pain or pain under the shoulder blades  Painful or persistently difficult swallowing  New shortness of breath  Fever of 100F or higher  Black, tarry-looking stools  For urgent or emergent issues, a gastroenterologist can be reached at any hour by calling [phone].   DIET:  We do recommend a small meal at first, but then you may proceed to your regular diet.  Drink plenty of fluids but you should avoid alcoholic beverages for 24 hours.  ACTIVITY:  You should plan to take it easy for the rest of today and you should NOT DRIVE  or use heavy machinery until tomorrow (because of the sedation medicines used during the test).    FOLLOW UP: Our staff will call the number listed on your records the next business day following your procedure to check on you and address any questions or concerns that you may have regarding the information given to you following your procedure. If we do not reach you, we will leave a message.  However, if you are feeling well and you are not experiencing any problems, there is no need to return our call.  We will assume that you have returned to your regular daily activities without incident.  If any biopsies were taken you will be contacted by phone or by letter within the next 1-3 weeks.  Please call us at [phone] if you have not heard about the biopsies in 3 weeks.    SIGNATURES/CONFIDENTIALITY: You and/or your care partner have signed paperwork which will be entered into your electronic medical record.  These signatures attest to the fact that that the information above on your After Visit Summary has been reviewed and is understood.  Full responsibility of the confidentiality of this discharge information lies with you and/or your care-partner. 

## 2017-01-20 NOTE — Progress Notes (Signed)
Spontaneous respirations throughout. VSS. Resting comfortably. To PACU on room air. Report to  Celia RN. 

## 2017-01-21 ENCOUNTER — Telehealth: Payer: Self-pay | Admitting: *Deleted

## 2017-01-21 NOTE — Telephone Encounter (Signed)
  Follow up Call-  Call back number 01/20/2017  Post procedure Call Back phone  # [phone]  Permission to leave phone message Yes  Some recent data might be hidden     Patient questions:  Do you have a fever, pain , or abdominal swelling? No. Pain Score  0 *  Have you tolerated food without any problems? Yes.    Have you been able to return to your normal activities? Yes.    Do you have any questions about your discharge instructions: Diet   No. Medications  No. Follow up visit  No.  Do you have questions or concerns about your Care? No.  Actions: * If pain score is 4 or above: No action needed, pain <4.

## 2017-03-10 DIAGNOSIS — G8929 Other chronic pain: Secondary | ICD-10-CM | POA: Diagnosis not present

## 2017-03-10 DIAGNOSIS — R5383 Other fatigue: Secondary | ICD-10-CM | POA: Diagnosis not present

## 2017-03-10 DIAGNOSIS — R432 Parageusia: Secondary | ICD-10-CM | POA: Diagnosis not present

## 2017-03-10 DIAGNOSIS — M2550 Pain in unspecified joint: Secondary | ICD-10-CM | POA: Diagnosis not present

## 2017-03-10 DIAGNOSIS — G629 Polyneuropathy, unspecified: Secondary | ICD-10-CM | POA: Diagnosis not present

## 2017-03-10 DIAGNOSIS — N912 Amenorrhea, unspecified: Secondary | ICD-10-CM | POA: Diagnosis not present

## 2017-03-10 DIAGNOSIS — M546 Pain in thoracic spine: Secondary | ICD-10-CM | POA: Diagnosis not present

## 2017-03-10 DIAGNOSIS — R232 Flushing: Secondary | ICD-10-CM | POA: Diagnosis not present

## 2017-03-10 DIAGNOSIS — R430 Anosmia: Secondary | ICD-10-CM | POA: Diagnosis not present

## 2017-03-19 ENCOUNTER — Other Ambulatory Visit: Payer: Self-pay | Admitting: Family Medicine

## 2017-03-19 DIAGNOSIS — M2550 Pain in unspecified joint: Secondary | ICD-10-CM | POA: Diagnosis not present

## 2017-03-19 DIAGNOSIS — N912 Amenorrhea, unspecified: Secondary | ICD-10-CM | POA: Diagnosis not present

## 2017-03-19 DIAGNOSIS — R51 Headache: Principal | ICD-10-CM

## 2017-03-19 DIAGNOSIS — R432 Parageusia: Secondary | ICD-10-CM | POA: Diagnosis not present

## 2017-03-19 DIAGNOSIS — G629 Polyneuropathy, unspecified: Secondary | ICD-10-CM | POA: Diagnosis not present

## 2017-03-19 DIAGNOSIS — R519 Headache, unspecified: Secondary | ICD-10-CM

## 2017-03-19 DIAGNOSIS — W57XXXS Bitten or stung by nonvenomous insect and other nonvenomous arthropods, sequela: Secondary | ICD-10-CM | POA: Diagnosis not present

## 2017-03-19 DIAGNOSIS — R430 Anosmia: Secondary | ICD-10-CM

## 2017-03-19 DIAGNOSIS — R5383 Other fatigue: Secondary | ICD-10-CM | POA: Diagnosis not present

## 2017-03-19 DIAGNOSIS — G8929 Other chronic pain: Secondary | ICD-10-CM | POA: Diagnosis not present

## 2017-04-08 ENCOUNTER — Other Ambulatory Visit: Payer: Self-pay | Admitting: Family Medicine

## 2017-04-08 DIAGNOSIS — M546 Pain in thoracic spine: Secondary | ICD-10-CM

## 2017-04-19 ENCOUNTER — Other Ambulatory Visit: Payer: 59

## 2017-05-11 ENCOUNTER — Other Ambulatory Visit: Payer: Self-pay | Admitting: Family Medicine

## 2017-05-11 DIAGNOSIS — Z1231 Encounter for screening mammogram for malignant neoplasm of breast: Secondary | ICD-10-CM

## 2017-05-22 ENCOUNTER — Other Ambulatory Visit: Payer: 59

## 2017-05-25 ENCOUNTER — Ambulatory Visit
Admission: RE | Admit: 2017-05-25 | Discharge: 2017-05-25 | Disposition: A | Discharge status: Home / Self Care | Payer: 59 | Source: Ambulatory Visit | Attending: Family Medicine | Admitting: Family Medicine

## 2017-05-25 DIAGNOSIS — Z1231 Encounter for screening mammogram for malignant neoplasm of breast: Secondary | ICD-10-CM | POA: Diagnosis not present

## 2017-06-02 MED FILL — VALACYCLOVIR HCL 500 MG TAB: 500 | 60 days supply | Qty: 60 | Fill #0

## 2017-06-08 ENCOUNTER — Ambulatory Visit
Admission: RE | Admit: 2017-06-08 | Discharge: 2017-06-08 | Disposition: A | Discharge status: Home / Self Care | Payer: 59 | Source: Ambulatory Visit | Attending: Family Medicine | Admitting: Family Medicine

## 2017-06-08 DIAGNOSIS — R51 Headache: Principal | ICD-10-CM

## 2017-06-08 DIAGNOSIS — M50223 Other cervical disc displacement at C6-C7 level: Secondary | ICD-10-CM | POA: Diagnosis not present

## 2017-06-08 DIAGNOSIS — M546 Pain in thoracic spine: Secondary | ICD-10-CM

## 2017-06-08 DIAGNOSIS — R519 Headache, unspecified: Secondary | ICD-10-CM

## 2017-06-08 DIAGNOSIS — R430 Anosmia: Secondary | ICD-10-CM

## 2017-06-18 MED FILL — ALPRAZolam 0.5 MG TABS: 0.5 | 3 days supply | Qty: 5 | Fill #0

## 2017-06-29 ENCOUNTER — Ambulatory Visit
Admission: RE | Admit: 2017-06-29 | Discharge: 2017-06-29 | Disposition: A | Discharge status: Home / Self Care | Payer: 59 | Source: Ambulatory Visit | Attending: Family Medicine | Admitting: Family Medicine

## 2017-06-29 DIAGNOSIS — R51 Headache: Secondary | ICD-10-CM | POA: Diagnosis not present

## 2017-06-29 MED ORDER — GADOBENATE DIMEGLUMINE 529 MG/ML IV SOLN
15.0000 mL | Freq: Once | INTRAVENOUS | Status: AC | PRN
  Administered 2017-06-29: 15 mL via INTRAVENOUS

## 2017-08-10 ENCOUNTER — Ambulatory Visit (INDEPENDENT_AMBULATORY_CARE_PROVIDER_SITE_OTHER): Payer: 59 | Admitting: Internal Medicine

## 2017-08-10 ENCOUNTER — Encounter: Payer: Self-pay | Admitting: Internal Medicine

## 2017-08-10 VITALS — BP 110/70 | HR 78 | Ht 65.0 in | Wt 181.8 lb

## 2017-08-10 DIAGNOSIS — E2740 Unspecified adrenocortical insufficiency: Secondary | ICD-10-CM | POA: Diagnosis not present

## 2017-08-10 DIAGNOSIS — R7989 Other specified abnormal findings of blood chemistry: Secondary | ICD-10-CM

## 2017-08-10 LAB — CORTISOL
CORTISOL PLASMA: 22.7 ug/dL
Cortisol, Plasma: 7.7 ug/dL
Cortisol, Plasma: 19.2 ug/dL

## 2017-08-10 MED ORDER — COSYNTROPIN NICU IV SYRINGE 0.25 MG/ML (STANDARD DOSE)
0.2500 mg | Freq: Once | INTRAVENOUS | Status: AC
  Administered 2017-08-10: 0.25 mg via INTRAMUSCULAR

## 2017-08-10 NOTE — Progress Notes (Signed)
Patient ID: Robin Maynard, female   DOB: May 13, 1966, 52 y.o.   MRN: [id number]    HPI  Robin Maynard is a 52 y.o.-year-old female, referred by her PCP, Dr.Le for evaluation for low cortisol level - suspicion for adrenal insufficiency.  She had URI 09/2016 >> L upper abd. Pain >> ED >> CT scan - hazing around sSMA >> referred to Dr. Ardis Hughs >> further investig. Was negative >> sent back to PCP, who checked a cortisol level >> low cortisol level in 03/2017.  Retrospectively, she is more tired, sleeps poorly. She also has anosmia (? Allergies vs. HSV).   Pt mentions: - no weight loss - had some weight gain recently - + fatigue and not sleeping well (not staying asleep) - + heat intolerance when eating carbs - no nausea - no vomiting - + abdominal pain - + mm aches - neck - no palpitations - no HAs - + dizziness occasionally with moving, occasionally when standing from a sittin position - no syncopal episodes - no dark skin discoloration  No h/o steroid use in the past (Dexamethasone 10 mg in 12/2016). No h/o Depo-provera, Megace, po ketoconazole, phenytoin, rifampin, chronic fluconazole use. No h/o autoimmune diseases in pt or family mbs. No excess use of NSAIDs. No h/o generalized infections or HIV. No IVDA. No h/o head injury or severe HA. No h/o malignancy.  I reviewed pt's cortisol levels: 03/19/2017: cortisol 4.9 (am)  No h/o hyponatremia or hyperkalemia.   Chemistry      Component Value Date/Time   NA 140 09/29/2016 0524   K 4.0 09/29/2016 0524   CL 111 09/29/2016 0524   CO2 23 09/29/2016 0524   BUN 9 09/29/2016 0524   CREATININE 0.73 09/29/2016 0524      Component Value Date/Time   CALCIUM 8.7 (L) 09/29/2016 0524   ALKPHOS 58 09/29/2016 0524   AST 15 09/29/2016 0524   ALT 24 09/29/2016 0524   BILITOT 0.7 09/29/2016 0524     Pt. also has a history of DUB, s/p ablation 12/2016, GERD, elevated FSH of 53 in 03/2017.  ROS: Constitutional: + see  HPI Eyes: no blurry vision, no xerophthalmia ENT: no sore throat, no nodules palpated in throat, no dysphagia/odynophagia, no hoarseness, + tinnitus Cardiovascular: no CP/SOB/palpitations/+ leg swelling Respiratory: + cough/SOB Gastrointestinal: no N/V/D/C Musculoskeletal: + muscle/joint aches Skin: no rashes Neurological: no tremors/numbness/tingling/dizziness Psychiatric: no depression/anxiety  Past Medical History:  Diagnosis Date  . Acute pancreatitis   . Constipation   . Gastritis   . GERD (gastroesophageal reflux disease)    GI work up in progress  . Obesity    Past Surgical History:  Procedure Laterality Date  . DILITATION & CURRETTAGE/HYSTROSCOPY WITH HYDROTHERMAL ABLATION N/A 12/26/2016   Procedure: DILATATION & CURETTAGE/HYSTEROSCOPY WITH HYDROTHERMAL ABLATION;  Surgeon: Dian Queen, MD;  Location: [facility];  Service: Gynecology;  Laterality: N/A;  . TONSILLECTOMY     as a child  . TONSILLECTOMY AND ADENOIDECTOMY     Social History   Socioeconomic History  . Marital status: Married    Spouse name: Not on file  . Number of children: 1  Social Needs  Occupational History  . CRNA - [employer]  Tobacco Use  . Smoking status: Never Smoker  . Smokeless tobacco: Never Used  Substance and Sexual Activity  . Alcohol use: No  . Drug use: No   Current Outpatient Medications on File Prior to Visit  Medication Sig Dispense Refill  . valACYclovir (VALTREX)  500 MG tablet Take 500 mg by mouth daily as needed (breakout).      Current Facility-Administered Medications on File Prior to Visit  Medication Dose Route Frequency Provider Last Rate Last Dose  . 0.9 %  sodium chloride infusion  500 mL Intravenous Continuous Milus Banister, MD       No Known Allergies Family History  Problem Relation Age of Onset  . Gastric cancer Father   . Diabetes Father   . Heart disease Father   . Hypertension Father   . Deep vein thrombosis Mother   . Colon  polyps Mother   . Heart disease Mother   . Diabetes Paternal Grandmother   . Heart disease Paternal Grandmother   . Breast cancer Maternal Aunt   . Colon cancer Neg Hx     PE: BP 110/70   Pulse 78   Ht 5\' 5"  (1.651 m)   Wt 181 lb 12.8 oz (82.5 kg)   SpO2 98%   BMI 30.25 kg/m  Wt Readings from Last 3 Encounters:  08/10/17 181 lb 12.8 oz (82.5 kg)  01/20/17 175 lb (79.4 kg)  01/08/17 175 lb (79.4 kg)   Constitutional: overweight, in NAD Eyes: PERRLA, EOMI, no exophthalmos ENT: moist mucous membranes, no thyromegaly, no cervical lymphadenopathy Cardiovascular: RRR, No MRG Respiratory: CTA B Gastrointestinal: abdomen soft, NT, ND, BS+ Musculoskeletal: no deformities, strength intact in all 4 Skin: moist, warm, no rashes; no dark discoloration of skin Neurological: no tremor with outstretched hands, DTR normal in all 4  ASSESSMENT: 1. Low cortisol level  PLAN:  1. Low cortisol level - pt with fatigue, AP and a slightly low cortisol level as checked in 03/2017 (pt remembers this was checked in am) - she also remembers feeling great after a 10 mg Decadron she was given at the time of her endometrial ablation 12/2016 - discussed that this is not unusual, even in pt's w/o adrenal insufficiency - of note, her adrenals appear normal on review of the CT scan 12/11/2016 - we discussed that the diagnosis test for AI is a cosyntropin stimulation test. I explained what this consists of. We will plan to check this at 8 am >> will return for this. I advised her not to use any steroid products around the time of the test. - if pt turns out to have AI, we will need to check several tests to try to establish the etiology. - we also discussed about proper replacement with Hydrocortisone (HC) in case she has AI >>  I explained side effects of overreplacement on many organs in the body and the fact that we need to decrease the dose to the minimum dose that allows her to feel well - a bid dose is  likely best >> 10 mg in am and 5 mg in pm, best ~3 pm - discussed sick days rules in case we need to start [facility]:  If you cannot keep anything down, including your hydrocortisone medication, please go to the emergency room or your primary care physician office to get steroids injected in the muscle or vein. Alternatively, you can inject 100 mg hydrocortisone in the muscle at home.  If you have a fever (more than 100 Fahrenheit) or gastroenteritis with nausea/vomiting and diarrhea, please double the dose of your hydrocortisone for the duration of the fever or the gastroenteritis.  Do not run out of your hydrocortisone medication. - I will send syringes and HC solution to her pharmacy for when cannot take po; also  advised that she might need to go to the UC or ED if cannot give injection - I will se her back if te test results are abnormal  Office Visit on 08/10/2017  Component Date Value Ref Range Status  . J335 ACTH 08/10/2017 20  6 - 50 pg/mL Final   Comment: Reference range applies only to specimens collected between 7am-10am   . Cortisol, Plasma 08/10/2017 7.7  ug/dL Final   AM:  4.3 - 22.4 ug/dLPM:  3.1 - 16.7 ug/dL  . Cortisol, Plasma 08/10/2017 19.2  ug/dL Final   AM:  4.3 - 22.4 ug/dLPM:  3.1 - 16.7 ug/dL  . Cortisol, Plasma 08/10/2017 22.7  ug/dL Final   AM:  4.3 - 22.4 ug/dLPM:  3.1 - 16.7 ug/dL   All labs normal (including stimulated cortisol >18.5) >> no evidence of Adrenal Insufficiency.  Philemon Kingdom, MD PhD [facility]ocr[facility]

## 2017-08-10 NOTE — Patient Instructions (Addendum)
Please stop at the lab and plan to be here for ~1h.  We will schedule another appt. If the results are abnormal.

## 2017-08-12 LAB — ACTH: C206 ACTH: 20 pg/mL (ref 6–50)

## 2017-09-17 MED FILL — VALACYCLOVIR HCL 500 MG TAB: 500 | 90 days supply | Qty: 90 | Fill #0

## 2017-09-29 DIAGNOSIS — R198 Other specified symptoms and signs involving the digestive system and abdomen: Secondary | ICD-10-CM | POA: Diagnosis not present

## 2017-09-29 DIAGNOSIS — Z1211 Encounter for screening for malignant neoplasm of colon: Secondary | ICD-10-CM | POA: Diagnosis not present

## 2017-09-29 DIAGNOSIS — B0229 Other postherpetic nervous system involvement: Secondary | ICD-10-CM | POA: Diagnosis not present

## 2017-09-29 DIAGNOSIS — B029 Zoster without complications: Secondary | ICD-10-CM | POA: Diagnosis not present

## 2017-09-29 MED FILL — METHYLPREDNISOLONE 4 MG TAB: 4 | 6 days supply | Qty: 21 | Fill #0

## 2017-10-08 ENCOUNTER — Other Ambulatory Visit: Payer: Self-pay | Admitting: Family Medicine

## 2017-10-08 DIAGNOSIS — M79671 Pain in right foot: Secondary | ICD-10-CM

## 2017-10-09 ENCOUNTER — Ambulatory Visit (HOSPITAL_COMMUNITY)
Admission: RE | Admit: 2017-10-09 | Discharge: 2017-10-09 | Disposition: A | Discharge status: Home / Self Care | Payer: 59 | Source: Ambulatory Visit | Attending: Family Medicine | Admitting: Family Medicine

## 2017-10-09 DIAGNOSIS — S99921A Unspecified injury of right foot, initial encounter: Secondary | ICD-10-CM | POA: Diagnosis not present

## 2017-10-09 DIAGNOSIS — M79671 Pain in right foot: Secondary | ICD-10-CM | POA: Diagnosis present

## 2017-10-20 ENCOUNTER — Encounter: Payer: Self-pay | Admitting: Allergy and Immunology

## 2017-11-04 ENCOUNTER — Encounter: Payer: Self-pay | Admitting: Family Medicine

## 2017-11-04 DIAGNOSIS — H524 Presbyopia: Secondary | ICD-10-CM | POA: Diagnosis not present

## 2017-11-04 DIAGNOSIS — H5213 Myopia, bilateral: Secondary | ICD-10-CM | POA: Diagnosis not present

## 2017-11-04 DIAGNOSIS — H52223 Regular astigmatism, bilateral: Secondary | ICD-10-CM | POA: Diagnosis not present

## 2017-11-04 MED FILL — VALACYCLOVIR HCL 500 MG TAB: 500 | 9 days supply | Qty: 50 | Fill #0

## 2017-11-23 ENCOUNTER — Encounter: Payer: Self-pay | Admitting: Allergy & Immunology

## 2017-11-23 ENCOUNTER — Ambulatory Visit (INDEPENDENT_AMBULATORY_CARE_PROVIDER_SITE_OTHER): Payer: 59 | Admitting: Allergy & Immunology

## 2017-11-23 VITALS — BP 108/70 | HR 80 | Temp 98.0°F | Resp 20 | Ht 63.0 in | Wt 179.2 lb

## 2017-11-23 DIAGNOSIS — R430 Anosmia: Secondary | ICD-10-CM | POA: Diagnosis not present

## 2017-11-23 DIAGNOSIS — J310 Chronic rhinitis: Secondary | ICD-10-CM

## 2017-11-23 DIAGNOSIS — R232 Flushing: Secondary | ICD-10-CM

## 2017-11-23 DIAGNOSIS — R112 Nausea with vomiting, unspecified: Secondary | ICD-10-CM

## 2017-11-23 NOTE — Progress Notes (Signed)
NEW PATIENT  Date of Service/Encounter:  11/23/17  Referring provider: Glenford Bayley, DO   Assessment:   Chronic rhinitis (weeds, grasses, indoor molds, outdoor molds, dust mites and cockroach)  Anosmia  History of episodes of flushing in the setting of vomiting  History of pancreatitis-like episode (February 2018)  History of GERD (on Nexium in the distant past)  Recurrent herpetic outbreaks - on valacyclovir prophylaxis   Robin Maynard is a 52 year old lovely female presenting with anosmia in the setting nasal inflammation noted on MRI.  She also has a history of pancreatitis like episode in February 2018, although it should be noted that her labs at that time were completely normal.  She did have an odd finding of inflammation surrounding the SMA on her abdominal CT, which did resolve over time.  Interestingly, she also has a history of episodes of flushing with vomiting.  These have not occurred in quite some time, but do warrant a workup especially in light of her recent CT findings and loss of sense of smell.  These flushing episodes do bring up a concern for carcinoid syndrome versus pheochromocytoma.  It could also be evidence of a food allergy, although they are not associated with any particular food exposure and testing to the most common foods today was negative.  Her lack of hypertension during these episodes points against pheochromocytoma. Typically carcinoid tumors are located in the GI tract, and these were not seen during her abdominal CTs. Depending on the size, however, it could have been missed. It does not seem that she had an evaluation for autoimmune pancreatitis, which can present with pancreatitis and anosmia in case reports. IgG4-related disease is one of the primary causes of autoimmune pancreatitis, so we could also consider this. She does tell me that she has undergone an autoimmune workup, but she is unsure of the results and they are not in our system.      Plan/Recommendations:   1. Chronic rhinitis - Testing today showed: weeds, grasses, indoor molds, outdoor molds, dust mites and cockroach - Avoidance measures provided. - Since she was having such few symptoms, I think it is OK to hold off on medications.  - She is in agreement with this plan.  - I am unsure of the relevance of these sensitizations, as her reactions were fairly mild on skin testing today. - I do not think that allergy shots are warranted given her mild symptoms, and their data to improve sense of smell are unknown.   2. Pancreatitis-like episodes with flushing, vomiting, and anosmia - Testing was negative to the most common foods (peanut, sesame, cashew, soy, fish mix, shellfish mix, wheat, milk, casein, egg), pointing against a food allergy.  - We will get IgG subsets to evaluate for IgG4 related disease. - We will get a screening ANA and inflammatory markers to rule out autoimmune disorders. - Her CMP in the last has been largely normal, therefore I think we can defer on a CMP today.  - We will obtain a 24 hour urine 5-HIAA to rule out carcinoid syndrome.   2. Return in about 6 months (around 05/25/2018) or earlier if labs are abnormal.     Subjective:   Robin Maynard is a 52 y.o. female presenting today for evaluation of  Chief Complaint  Patient presents with  . Allergy Testing    Robin Maynard has a history of the following: Patient Active Problem List   Diagnosis Date Noted  . Abdominal pain, epigastric  09/30/2016  . Acute pancreatitis 09/27/2016  . Constipation 09/27/2016  . Overweight (BMI 25.0-29.9) 09/27/2016  . Mass of right lower leg 08/11/2013  . Mass on back 08/11/2013    History obtained from: chart review and patient.  Robin Maynard was referred by Glenford Bayley, DO.     Robin Maynard is a 53 y.o. female presenting for an evaluation of possible allergies due to a history of anosmia. She came to this realization via a rather circuitous  route.  She was hospitalized was February 2018 with thickening around her pancreas. She presented to the ED with intense abdominal pain.  A few days prior to the onset of the symptoms, she was seen in urgent care and given a course of Bactrim for cough and sore throat.  She previously tolerated Bactrim without a problem.  She then noted intense epigastric pain that radiated to the back.  She went to the urgent care and was started on Tamiflu for presumed influenza.  That evening she began having nausea and vomiting.  Her abdominal pain was so intense that she went to the ER on February 23 and was admitted to the hospital for 3 days.  In the ER, she had a normal CBC as well as a CMP and lipase.  Lactic acid was also normal.  Urine analysis was normal.  CT scan showed no peripancreatic stranding, but she did have mild infiltration of the fat planes surrounding the superior mesenteric artery.  Her pain symptoms were controlled with medications, but she continued to have nausea and vomiting.  Upon admission, her diet was slowly advanced.  She did have an ultrasound that showed a normal gallbladder and bile duct.  Her labs remained normal during this hospitalization.  Upon discharge, she saw Dr. Owens Loffler, a gastroenterologist.  He recommended serial CTs of the abdomen to evaluate this area around the superior mesenteric artery.  He did perform an endoscopy that showed minimal inflammation in the gastric antrum.  Biopsies were taken.  She did have some gastritis noted.  Biopsy showed no Helicobacter pylori.  She had a repeat abdominal CT that noted that the inflammation around the superior mesenteric artery had cleared.  During this entire procedure, her primary care provider was attempting to figure this out.  It was noted during 1 of the visits that another symptoms she was endorsing was a loss of smell.  Therefore she had an MRI performed that demonstrated thickening of the nasal mucosa.  The olfactory bulbs  were intact.  There were no white matter or gray matter abnormalities noted in her brain.  Because of the mild thickening of the nasal mucosa, it was felt that she might have uncontrolled allergies and was referred here for allergy testing.    It was recommended that she start Nasacort and an antihistamine.  However, she tells me today that she was undergoing a workup for a low cortisol.  Therefore she never started the Nasacort.  The workup for the low cortisol was normal. She denies throat clearing but she does feel nasal fullness. She does have clear rhinorrhea every morning which improves during the day. She denies sneezing as well as ocular symptoms. It did not seem to get better over the winter at all.   She also notes a history of flushing with vomiting.  This is very transient, lasting a matter of 10-15 minutes.  This will occur and then she will go on with the rest of her day.  She has not  had an outbreak of this in quite some time.  She has no diarrhea with these episodes and no periods of elevated blood pressure.  She does not note that any foods because of these reactions.  For the last 15 years, she gets a herpetic outbreak that tracks into her nose. She was started on Valtrex. Now she is on 500mg  daily. Otherwise her infectious history is unremarkable.   History remarkable for a right swollen calf intermittently; she was evaluated for DVTs on a number of occasions without improvement.  Per the surgery note, they felt that this was a small subcutaneous epidermoid cyst which did not require removal.  She also had swelling of the left breast in which cancer was ruled out. She does have a history of intermittent episodes of "histamne release" where she would have erythema over her entire body several times per day. The last episode was December 2018. She felt that this was food related and she cut out gluten from her diet.    Otherwise, there is no history of other atopic diseases, including  asthma, drug allergies, food allergies, stinging insect allergies, or urticaria. There is no significant infectious history. Vaccinations are up to date.    Past Medical History: Patient Active Problem List   Diagnosis Date Noted  . Abdominal pain, epigastric 09/30/2016  . Acute pancreatitis 09/27/2016  . Constipation 09/27/2016  . Overweight (BMI 25.0-29.9) 09/27/2016  . Mass of right lower leg 08/11/2013  . Mass on back 08/11/2013    Medication List:  Allergies as of 11/23/2017   No Known Allergies     Medication List        Accurate as of 11/23/17 11:59 PM. Always use your most recent med list.          valACYclovir 500 MG tablet Commonly known as:  VALTREX Take 500 mg by mouth daily as needed (breakout).       Birth History: non-contributory.   Developmental History: non-contributory.   Past Surgical History: Past Surgical History:  Procedure Laterality Date  . DILITATION & CURRETTAGE/HYSTROSCOPY WITH HYDROTHERMAL ABLATION N/A 12/26/2016   Procedure: DILATATION & CURETTAGE/HYSTEROSCOPY WITH HYDROTHERMAL ABLATION;  Surgeon: Dian Queen, MD;  Location: [facility];  Service: Gynecology;  Laterality: N/A;  . TONSILLECTOMY AND ADENOIDECTOMY    . TYMPANOSTOMY TUBE PLACEMENT       Family History: Family History  Problem Relation Age of Onset  . Gastric cancer Father   . Diabetes Father   . Heart disease Father   . Hypertension Father   . Deep vein thrombosis Mother   . Colon polyps Mother   . Heart disease Mother   . Diabetes Paternal Grandmother   . Heart disease Paternal Grandmother   . Breast cancer Maternal Aunt   . Colon cancer Neg Hx   . Allergic rhinitis Neg Hx   . Asthma Neg Hx   . Angioedema Neg Hx   . Eczema Neg Hx   . Urticaria Neg Hx      Social History: Arneta lives at home with her family.  They live in a 53 year old home.  There is carpeting throughout the home.  They have electric heating and central cooling.  There  are dogs and horses at the home.  The dogs do sleep inside the bedroom.  There are no dust mite coverings on the bedding.  There is tobacco exposure, but Shaden herself does not smoke.  She currently works as a Immunologist at [employer].  Review of Systems: a 14-point review of systems is pertinent for what is mentioned in HPI.  Otherwise, all other systems were negative. Constitutional: negative other than that listed in the HPI Eyes: negative other than that listed in the HPI Ears, nose, mouth, throat, and face: negative other than that listed in the HPI Respiratory: negative other than that listed in the HPI Cardiovascular: negative other than that listed in the HPI Gastrointestinal: negative other than that listed in the HPI Genitourinary: negative other than that listed in the HPI Integument: negative other than that listed in the HPI Hematologic: negative other than that listed in the HPI Musculoskeletal: negative other than that listed in the HPI Neurological: negative other than that listed in the HPI Allergy/Immunologic: negative other than that listed in the HPI    Objective:   Blood pressure 108/70, pulse 80, temperature 98 F (36.7 C), temperature source Oral, resp. rate 20, height 5\' 3"  (1.6 m), weight 179 lb 3.2 oz (81.3 kg). Body mass index is 31.74 kg/m.   Physical Exam:  General: Alert, interactive, in no acute distress. Very pleasant female. Eyes: No conjunctival injection bilaterally, no discharge on the right, no discharge on the left and no Horner-Trantas dots present. PERRL bilaterally. EOMI without pain. No photophobia.  Ears: Right TM pearly gray with normal light reflex, Left TM pearly gray with normal light reflex, Right TM intact without perforation and Left TM intact without perforation.  Nose/Throat: External nose within normal limits, nasal crease present and septum midline. Turbinates minimally edematous with clear discharge. Posterior oropharynx  erythematous without cobblestoning in the posterior oropharynx. Tonsils 2+ without exudates.  Tongue without thrush. Neck: Supple without thyromegaly. Trachea midline. Adenopathy: no enlarged lymph nodes appreciated in the anterior cervical, occipital, axillary, epitrochlear, inguinal, or popliteal regions. Lungs: Clear to auscultation without wheezing, rhonchi or rales. No increased work of breathing. CV: Normal S1/S2. No murmurs. Capillary refill <2 seconds.  Abdomen: Nondistended, nontender. No guarding or rebound tenderness. Bowel sounds present in all fields and hyperactive  Skin: Warm and dry, without lesions or rashes. Extremities:  No clubbing, cyanosis or edema. Neuro:   Grossly intact. No focal deficits appreciated. Responsive to questions.  Diagnostic studies:    Allergy Studies:   Indoor/Outdoor Percutaneous Adult Environmental Panel: negative to the entire panel with adequate controls.  Indoor/Outdoor Selected Intradermal Environmental Panel: positive to [employer], Grass mix, ragweed mix, mold mix #2, mold mix #3 and cockroach. However, these were all fairly small but they were larger than the negative control. Otherwise negative with adequate controls.  Most Common Foods Panel (peanut, cashew, soy, fish mix, shellfish mix, wheat, milk, egg): negative to all with adequate controls   Allergy testing results were read and interpreted by myself, documented by clinical staff.     Salvatore Marvel, MD [facility]

## 2017-11-23 NOTE — Patient Instructions (Addendum)
1. Chronic rhinitis - Testing today showed: weeds, grasses, indoor molds, outdoor molds, dust mites and cockroach - Avoidance measures provided. - Since you are having such few symptoms, I think it is OK to hold off on medications.  - Your reactivities were very low on the testing, so I am unsure how relevant they are.  - I am going to go through your chart later to see what kind of workup you have had.  - I will send you an email with thoughts later tonight or early tomorrow.  2. Return in about 6 months (around 05/25/2018).   Please inform us of any Emergency Department visits, hospitalizations, or changes in symptoms. Call us before going to the ED for breathing or allergy symptoms since we might be able to fit you in for a sick visit. Feel free to contact us anytime with any questions, problems, or concerns.  It was a pleasure to meet you today!  Websites that have reliable patient information: 1. American Academy of Asthma, Allergy, and Immunology: www.aaaai.org 2. Food Allergy Research and Education (FARE): foodallergy.org 3. Mothers of Asthmatics: http://www.asthmacommunitynetwork.org 4. American College of Allergy, Asthma, and Immunology: www.acaai.org    Reducing Pollen Exposure  The American Academy of Allergy, Asthma and Immunology suggests the following steps to reduce your exposure to pollen during allergy seasons.    1. Do not hang sheets or clothing out to dry; pollen may collect on these items. 2. Do not mow lawns or spend time around freshly cut grass; mowing stirs up pollen. 3. Keep windows closed at night.  Keep car windows closed while driving. 4. Minimize morning activities outdoors, a time when pollen counts are usually at their highest. 5. Stay indoors as much as possible when pollen counts or humidity is high and on windy days when pollen tends to remain in the air longer. 6. Use air conditioning when possible.  Many air conditioners have filters that trap the  pollen spores. 7. Use a HEPA room air filter to remove pollen form the indoor air you breathe.  Control of Mold Allergen   Mold and fungi can grow on a variety of surfaces provided certain temperature and moisture conditions exist.  Outdoor molds grow on plants, decaying vegetation and soil.  The major outdoor mold, Alternaria and Cladosporium, are found in very high numbers during hot and dry conditions.  Generally, a late Summer - Fall peak is seen for common outdoor fungal spores.  Rain will temporarily lower outdoor mold spore count, but counts rise rapidly when the rainy period ends.  The most important indoor molds are Aspergillus and Penicillium.  Dark, humid and poorly ventilated basements are ideal sites for mold growth.  The next most common sites of mold growth are the bathroom and the kitchen.  Outdoor (Seasonal) Mold Control  Positive outdoor molds via skin testing: Bipolaris (Helminthsporium), Drechslera (Curvalaria) and Mucor  1. Use air conditioning and keep windows closed 2. Avoid exposure to decaying vegetation. 3. Avoid leaf raking. 4. Avoid grain handling. 5. Consider wearing a face mask if working in moldy areas.  6.   Indoor (Perennial) Mold Control   Positive indoor molds via skin testing: Aspergillus and Penicillium  1. Maintain humidity below 50%. 2. Clean washable surfaces with 5% bleach solution. 3. Remove sources e.g. contaminated carpets.     Control of House Dust Mite Allergen    House dust mites play a major role in allergic asthma and rhinitis.  They occur in environments with high humidity  wherever human skin, the food for dust mites is found. High levels have been detected in dust obtained from mattresses, pillows, carpets, upholstered furniture, bed covers, clothes and soft toys.  The principal allergen of the house dust mite is found in its feces.  A gram of dust may contain 1,000 mites and 250,000 fecal particles.  Mite antigen is easily measured  in the air during house cleaning activities.    1. Encase mattresses, including the box spring, and pillow, in an air tight cover.  Seal the zipper end of the encased mattresses with wide adhesive tape. 2. Wash the bedding in water of 130 degrees Farenheit weekly.  Avoid cotton comforters/quilts and flannel bedding: the most ideal bed covering is the dacron comforter. 3. Remove all upholstered furniture from the bedroom. 4. Remove carpets, carpet padding, rugs, and non-washable window drapes from the bedroom.  Wash drapes weekly or use plastic window coverings. 5. Remove all non-washable stuffed toys from the bedroom.  Wash stuffed toys weekly. 6. Have the room cleaned frequently with a vacuum cleaner and a damp dust-mop.  The patient should not be in a room which is being cleaned and should wait 1 hour after cleaning before going into the room. 7. Close and seal all heating outlets in the bedroom.  Otherwise, the room will become filled with dust-laden air.  An electric heater can be used to heat the room. 8. Reduce indoor humidity to less than 50%.  Do not use a humidifier.  Control of Cockroach Allergen  Cockroach allergen has been identified as an important cause of acute attacks of asthma, especially in urban settings.  There are fifty-five species of cockroach that exist in the [place], however only three, the [place], [place] species produce allergen that can affect patients with Asthma.  Allergens can be obtained from fecal particles, egg casings and secretions from cockroaches.    1. Remove food sources. 2. Reduce access to water. 3. Seal access and entry points. 4. Spray runways with 0.5-1% Diazinon or Chlorpyrifos 5. Blow boric acid power under stoves and refrigerator. 6. Place bait stations (hydramethylnon) at feeding sites.

## 2017-11-24 ENCOUNTER — Encounter: Payer: Self-pay | Admitting: Allergy & Immunology

## 2017-11-26 DIAGNOSIS — R232 Flushing: Secondary | ICD-10-CM | POA: Diagnosis not present

## 2017-11-27 LAB — IGG 1, 2, 3, AND 4
IGG (IMMUNOGLOBIN G), SERUM: 701 mg/dL (ref 700–1600)
IGG 4: 5 mg/dL (ref 2–96)
IGG, SUBCLASS 1: 299 mg/dL (ref 248–810)
IgG, Subclass 2: 285 mg/dL (ref 130–555)
IgG, Subclass 3: 67 mg/dL (ref 15–102)

## 2017-11-27 LAB — SEDIMENTATION RATE: SED RATE: 19 mm/h (ref 0–40)

## 2017-11-27 LAB — C-REACTIVE PROTEIN: CRP: 3.5 mg/L (ref 0.0–4.9)

## 2017-11-27 LAB — ANA: Anti Nuclear Antibody(ANA): NEGATIVE

## 2017-12-02 DIAGNOSIS — R232 Flushing: Secondary | ICD-10-CM | POA: Diagnosis not present

## 2017-12-04 LAB — 5 HIAA, QUANTITATIVE, URINE, 24 HOUR
5-HIAA, Ur: 2.2 mg/L
5-HIAA,Quant.,24 Hr Urine: 3.1 mg/24 hr (ref 0.0–14.9)

## 2017-12-21 MED FILL — VALACYCLOVIR HCL 500 MG TAB: 500 | 32 days supply | Qty: 180 | Fill #1 | Status: TO

## 2018-01-07 DIAGNOSIS — R232 Flushing: Secondary | ICD-10-CM | POA: Diagnosis not present

## 2018-01-07 DIAGNOSIS — R5383 Other fatigue: Secondary | ICD-10-CM | POA: Diagnosis not present

## 2018-01-07 DIAGNOSIS — W57XXXA Bitten or stung by nonvenomous insect and other nonvenomous arthropods, initial encounter: Secondary | ICD-10-CM | POA: Diagnosis not present

## 2018-01-07 DIAGNOSIS — R9389 Abnormal findings on diagnostic imaging of other specified body structures: Secondary | ICD-10-CM | POA: Diagnosis not present

## 2018-01-07 DIAGNOSIS — M546 Pain in thoracic spine: Secondary | ICD-10-CM | POA: Diagnosis not present

## 2018-01-07 DIAGNOSIS — R430 Anosmia: Secondary | ICD-10-CM | POA: Diagnosis not present

## 2018-01-07 DIAGNOSIS — M2550 Pain in unspecified joint: Secondary | ICD-10-CM | POA: Diagnosis not present

## 2018-01-07 DIAGNOSIS — S70362A Insect bite (nonvenomous), left thigh, initial encounter: Secondary | ICD-10-CM | POA: Diagnosis not present

## 2018-01-13 DIAGNOSIS — M2550 Pain in unspecified joint: Secondary | ICD-10-CM | POA: Diagnosis not present

## 2018-01-13 DIAGNOSIS — R5383 Other fatigue: Secondary | ICD-10-CM | POA: Diagnosis not present

## 2018-02-03 DIAGNOSIS — H35412 Lattice degeneration of retina, left eye: Secondary | ICD-10-CM | POA: Diagnosis not present

## 2018-02-03 DIAGNOSIS — H2513 Age-related nuclear cataract, bilateral: Secondary | ICD-10-CM | POA: Diagnosis not present

## 2018-02-03 DIAGNOSIS — H40053 Ocular hypertension, bilateral: Secondary | ICD-10-CM | POA: Diagnosis not present

## 2018-02-16 DIAGNOSIS — N939 Abnormal uterine and vaginal bleeding, unspecified: Secondary | ICD-10-CM | POA: Diagnosis not present

## 2018-02-16 DIAGNOSIS — N8184 Pelvic muscle wasting: Secondary | ICD-10-CM | POA: Diagnosis not present

## 2018-02-18 ENCOUNTER — Telehealth: Payer: Self-pay | Admitting: Gastroenterology

## 2018-02-18 DIAGNOSIS — R222 Localized swelling, mass and lump, trunk: Secondary | ICD-10-CM | POA: Diagnosis not present

## 2018-02-18 NOTE — Telephone Encounter (Signed)
The pt has been scheduled for previsit and colon.  The pt has been advised of the information and verbalized understanding.

## 2018-02-18 NOTE — Telephone Encounter (Signed)
OK to go ahead with colonoscopy without OV, I know Angie very well.  Offer my next [facility] appt, if she prefers sooner, offer my next [facility] appt. Dx. LUQ pain, screening.  thanks

## 2018-02-18 NOTE — Telephone Encounter (Signed)
Dr Ardis Hughs the pt is calling because she has continued LUQ pain (seen June 18).  She wants to have a colon and does not want to come in for an office visit.  Please advise on direct colon.

## 2018-02-19 ENCOUNTER — Other Ambulatory Visit: Payer: Self-pay

## 2018-02-19 ENCOUNTER — Other Ambulatory Visit: Payer: Self-pay | Admitting: Family Medicine

## 2018-02-19 ENCOUNTER — Encounter (HOSPITAL_BASED_OUTPATIENT_CLINIC_OR_DEPARTMENT_OTHER): Payer: Self-pay | Admitting: *Deleted

## 2018-02-19 DIAGNOSIS — R9389 Abnormal findings on diagnostic imaging of other specified body structures: Secondary | ICD-10-CM

## 2018-02-21 ENCOUNTER — Other Ambulatory Visit: Payer: Self-pay | Admitting: General Surgery

## 2018-02-23 ENCOUNTER — Encounter (HOSPITAL_BASED_OUTPATIENT_CLINIC_OR_DEPARTMENT_OTHER): Payer: Self-pay

## 2018-02-23 ENCOUNTER — Ambulatory Visit (HOSPITAL_BASED_OUTPATIENT_CLINIC_OR_DEPARTMENT_OTHER): Payer: 59 | Admitting: Certified Registered"

## 2018-02-23 ENCOUNTER — Ambulatory Visit (HOSPITAL_BASED_OUTPATIENT_CLINIC_OR_DEPARTMENT_OTHER)
Admission: RE | Admit: 2018-02-23 | Discharge: 2018-02-23 | Disposition: A | Discharge status: Home / Self Care | Payer: 59 | Source: Ambulatory Visit | Attending: General Surgery | Admitting: General Surgery

## 2018-02-23 ENCOUNTER — Other Ambulatory Visit: Payer: Self-pay

## 2018-02-23 ENCOUNTER — Encounter (HOSPITAL_BASED_OUTPATIENT_CLINIC_OR_DEPARTMENT_OTHER)
Admission: RE | Disposition: A | Discharge status: Home / Self Care | Payer: Self-pay | Source: Ambulatory Visit | Attending: General Surgery

## 2018-02-23 DIAGNOSIS — L723 Sebaceous cyst: Secondary | ICD-10-CM | POA: Insufficient documentation

## 2018-02-23 DIAGNOSIS — R222 Localized swelling, mass and lump, trunk: Secondary | ICD-10-CM | POA: Diagnosis not present

## 2018-02-23 DIAGNOSIS — Z8249 Family history of ischemic heart disease and other diseases of the circulatory system: Secondary | ICD-10-CM | POA: Insufficient documentation

## 2018-02-23 DIAGNOSIS — K219 Gastro-esophageal reflux disease without esophagitis: Secondary | ICD-10-CM | POA: Diagnosis not present

## 2018-02-23 HISTORY — PX: MASS EXCISION: SHX2000

## 2018-02-23 HISTORY — DX: Localized swelling, mass and lump, trunk: R22.2

## 2018-02-23 HISTORY — DX: Other specified postprocedural states: Z98.890

## 2018-02-23 HISTORY — DX: Nausea with vomiting, unspecified: R11.2

## 2018-02-23 SURGERY — EXCISION MASS
Anesthesia: Monitor Anesthesia Care | Site: Back | Laterality: Right

## 2018-02-23 MED ORDER — MIDAZOLAM HCL 2 MG/2ML IJ SOLN
INTRAMUSCULAR | Status: AC
  Filled 2018-02-23: qty 2

## 2018-02-23 MED ORDER — ACETAMINOPHEN 500 MG PO TABS
ORAL_TABLET | ORAL | Status: AC
  Filled 2018-02-23: qty 2

## 2018-02-23 MED ORDER — LACTATED RINGERS IV SOLN
INTRAVENOUS | Status: DC
  Administered 2018-02-23: 11:00:00 via INTRAVENOUS

## 2018-02-23 MED ORDER — LIDOCAINE HCL (PF) 1 % IJ SOLN
INTRAMUSCULAR | Status: AC
  Filled 2018-02-23: qty 30

## 2018-02-23 MED ORDER — BUPIVACAINE HCL (PF) 0.25 % IJ SOLN
INTRAMUSCULAR | Status: AC
  Filled 2018-02-23: qty 30

## 2018-02-23 MED ORDER — ACETAMINOPHEN 500 MG PO TABS
1000.0000 mg | ORAL_TABLET | ORAL | Status: AC
  Administered 2018-02-23: 1000 mg via ORAL

## 2018-02-23 MED ORDER — FENTANYL CITRATE (PF) 100 MCG/2ML IJ SOLN
INTRAMUSCULAR | Status: AC
  Filled 2018-02-23: qty 2

## 2018-02-23 MED ORDER — FENTANYL CITRATE (PF) 100 MCG/2ML IJ SOLN
50.0000 ug | INTRAMUSCULAR | Status: DC | PRN
  Administered 2018-02-23: 100 ug via INTRAVENOUS

## 2018-02-23 MED ORDER — BACITRACIN ZINC 500 UNIT/GM EX OINT
TOPICAL_OINTMENT | CUTANEOUS | Status: DC | PRN
  Administered 2018-02-23: 1 via TOPICAL

## 2018-02-23 MED ORDER — SCOPOLAMINE 1 MG/3DAYS TD PT72
MEDICATED_PATCH | TRANSDERMAL | Status: AC
  Filled 2018-02-23: qty 1

## 2018-02-23 MED ORDER — OXYCODONE HCL 5 MG/5ML PO SOLN: 5.0000 mg | Freq: Once | ORAL | Status: DC | PRN

## 2018-02-23 MED ORDER — BUPIVACAINE-EPINEPHRINE (PF) 0.25% -1:200000 IJ SOLN
INTRAMUSCULAR | Status: AC
  Filled 2018-02-23: qty 30

## 2018-02-23 MED ORDER — ONDANSETRON HCL 4 MG/2ML IJ SOLN
INTRAMUSCULAR | Status: DC | PRN
  Administered 2018-02-23: 4 mg via INTRAVENOUS

## 2018-02-23 MED ORDER — MIDAZOLAM HCL 2 MG/2ML IJ SOLN
1.0000 mg | INTRAMUSCULAR | Status: DC | PRN
  Administered 2018-02-23: 2 mg via INTRAVENOUS

## 2018-02-23 MED ORDER — BACITRACIN ZINC 500 UNIT/GM EX OINT
TOPICAL_OINTMENT | CUTANEOUS | Status: AC
  Filled 2018-02-23: qty 28.35

## 2018-02-23 MED ORDER — FENTANYL CITRATE (PF) 100 MCG/2ML IJ SOLN: 25.0000 ug | INTRAMUSCULAR | Status: DC | PRN

## 2018-02-23 MED ORDER — PROPOFOL 500 MG/50ML IV EMUL
INTRAVENOUS | Status: DC | PRN
  Administered 2018-02-23: 75 ug/kg/min via INTRAVENOUS

## 2018-02-23 MED ORDER — OXYCODONE HCL 5 MG PO TABS: 5.0000 mg | ORAL_TABLET | Freq: Once | ORAL | Status: DC | PRN

## 2018-02-23 MED ORDER — CEFAZOLIN SODIUM-DEXTROSE 2-4 GM/100ML-% IV SOLN
2.0000 g | INTRAVENOUS | Status: AC
  Administered 2018-02-23: 2 g via INTRAVENOUS

## 2018-02-23 MED ORDER — SCOPOLAMINE 1 MG/3DAYS TD PT72
1.0000 | MEDICATED_PATCH | Freq: Once | TRANSDERMAL | Status: DC | PRN
  Administered 2018-02-23: 1.5 mg via TRANSDERMAL

## 2018-02-23 MED ORDER — CEFAZOLIN SODIUM-DEXTROSE 2-4 GM/100ML-% IV SOLN
INTRAVENOUS | Status: AC
  Filled 2018-02-23: qty 100

## 2018-02-23 MED ORDER — ONDANSETRON HCL 4 MG/2ML IJ SOLN: 4.0000 mg | Freq: Four times a day (QID) | INTRAMUSCULAR | Status: DC | PRN

## 2018-02-23 MED ORDER — LIDOCAINE HCL 1 % IJ SOLN
INTRAMUSCULAR | Status: DC | PRN
  Administered 2018-02-23: 9 mL

## 2018-02-23 SURGICAL SUPPLY — 52 items
ADH SKN CLS APL DERMABOND .7 (GAUZE/BANDAGES/DRESSINGS)
BLADE CLIPPER SURG (BLADE) IMPLANT
BLADE SURG 15 STRL LF DISP TIS (BLADE) ×1 IMPLANT
BLADE SURG 15 STRL SS (BLADE) ×2
CANISTER SUCT 1200ML W/VALVE (MISCELLANEOUS) ×1 IMPLANT
CHLORAPREP W/TINT 26ML (MISCELLANEOUS) ×2 IMPLANT
CLSR STERI-STRIP ANTIMIC 1/2X4 (GAUZE/BANDAGES/DRESSINGS) ×1 IMPLANT
COVER BACK TABLE 60X90IN (DRAPES) ×2 IMPLANT
COVER MAYO STAND STRL (DRAPES) ×2 IMPLANT
DECANTER SPIKE VIAL GLASS SM (MISCELLANEOUS) IMPLANT
DERMABOND ADVANCED (GAUZE/BANDAGES/DRESSINGS)
DERMABOND ADVANCED .7 DNX12 (GAUZE/BANDAGES/DRESSINGS) ×1 IMPLANT
DRAPE LAPAROTOMY 100X72 PEDS (DRAPES) ×2 IMPLANT
DRAPE UTILITY XL STRL (DRAPES) ×2 IMPLANT
DRSG TEGADERM 4X4.75 (GAUZE/BANDAGES/DRESSINGS) ×1 IMPLANT
ELECT COATED BLADE 2.86 ST (ELECTRODE) ×1 IMPLANT
ELECT REM PT RETURN 9FT ADLT (ELECTROSURGICAL) ×2
ELECTRODE REM PT RTRN 9FT ADLT (ELECTROSURGICAL) ×1 IMPLANT
GAUZE PACKING IODOFORM 1/4X15 (GAUZE/BANDAGES/DRESSINGS) IMPLANT
GAUZE SPONGE 4X4 12PLY STRL LF (GAUZE/BANDAGES/DRESSINGS) ×1 IMPLANT
GLOVE BIO SURGEON STRL SZ7 (GLOVE) ×2 IMPLANT
GLOVE BIOGEL PI IND STRL 7.5 (GLOVE) ×1 IMPLANT
GLOVE BIOGEL PI INDICATOR 7.5 (GLOVE) ×1
GLOVE EXAM NITRILE MD LF STRL (GLOVE) ×1 IMPLANT
GOWN STRL REUS W/ TWL LRG LVL3 (GOWN DISPOSABLE) ×3 IMPLANT
GOWN STRL REUS W/TWL LRG LVL3 (GOWN DISPOSABLE) ×4
KIT MARKER MARGIN INK (KITS) IMPLANT
NDL HYPO 25X1 1.5 SAFETY (NEEDLE) ×1 IMPLANT
NEEDLE HYPO 25X1 1.5 SAFETY (NEEDLE) ×2 IMPLANT
NS IRRIG 1000ML POUR BTL (IV SOLUTION) ×1 IMPLANT
PACK BASIN DAY SURGERY FS (CUSTOM PROCEDURE TRAY) ×2 IMPLANT
PENCIL BUTTON HOLSTER BLD 10FT (ELECTRODE) ×2 IMPLANT
SLEEVE SCD COMPRESS KNEE MED (MISCELLANEOUS) ×1 IMPLANT
SPONGE LAP 4X18 RFD (DISPOSABLE) ×2 IMPLANT
SUT ETHILON 2 0 FS 18 (SUTURE) IMPLANT
SUT ETHILON 3 0 PS 1 (SUTURE) ×2 IMPLANT
SUT MNCRL AB 4-0 PS2 18 (SUTURE) ×1 IMPLANT
SUT SILK 2 0 SH (SUTURE) IMPLANT
SUT VIC AB 2-0 SH 18 (SUTURE) ×1 IMPLANT
SUT VIC AB 2-0 SH 27 (SUTURE)
SUT VIC AB 2-0 SH 27XBRD (SUTURE) IMPLANT
SUT VIC AB 3-0 X1 27 (SUTURE) IMPLANT
SUT VICRYL 3-0 CR8 SH (SUTURE) IMPLANT
SUT VICRYL 4-0 PS2 18IN ABS (SUTURE) IMPLANT
SWAB COLLECTION DEVICE MRSA (MISCELLANEOUS) IMPLANT
SWAB CULTURE ESWAB REG 1ML (MISCELLANEOUS) IMPLANT
SYR CONTROL 10ML LL (SYRINGE) ×2 IMPLANT
TOWEL GREEN STERILE FF (TOWEL DISPOSABLE) ×2 IMPLANT
TOWEL OR NON WOVEN STRL DISP B (DISPOSABLE) ×1 IMPLANT
TUBE CONNECTING 20X1/4 (TUBING) ×1 IMPLANT
UNDERPAD 30X30 (UNDERPADS AND DIAPERS) IMPLANT
YANKAUER SUCT BULB TIP NO VENT (SUCTIONS) ×1 IMPLANT

## 2018-02-23 NOTE — Op Note (Signed)
Preoperative diagnosis: back mass Postoperative diagnosis: same as above Procedure: Excision of back mass, 2x3 cm subq Surgeon: Dr. Serita Grammes Anesthesia: Local with IV sedation Estimated blood loss: 10 cc Specimens: Back mass to pathology Complications: None Drains: None Sponge needle count was correct at completion Disposition to recovery stable  Indications: [name] who presents with a draining back mass.  This is consistent with a previously infected sebaceous cyst.  We discussed excision.  Procedure: After informed consent was obtained she was then taken to the operating room.  She was given antibiotics.  She had SCDs in place.  She was placed under monitored anesthesia care.  She was positioned in the left lateral decubitus position.  She was padded.  She was then prepped and draped in the standard sterile surgical fashion.  A surgical timeout was then performed.  I infiltrated a mixture of lidocaine and Marcaine throughout this area.  I then made an elliptical incision to encompass the skin connection.  I sharply remove the skin as well as the entire mass.  This was then passed off the table.  I then obtained hemostasis.  This was closed with 2-0 Vicryl.  4 oh nylons were used to close the skin.  Bacitracin and sterile dressing were placed.  She tolerated this well and was transferred to recovery room.

## 2018-02-23 NOTE — Discharge Instructions (Signed)
[place] Office Phone Number [phone]  POST OP INSTRUCTIONS Use motrin or tylenol for pain.  Once dressing comes off use ice daily for 48 hours.  May want to cover even after dressing off in 48 hours due to stitches. Office will call for suture removal.  Always review your discharge instruction sheet given to you by the facility where your surgery was performed.  IF YOU HAVE DISABILITY OR FAMILY LEAVE FORMS, YOU MUST BRING THEM TO THE OFFICE FOR PROCESSING.  DO NOT GIVE THEM TO YOUR DOCTOR.  1. A prescription for pain medication may be given to you upon discharge.  Take your pain medication as prescribed, if needed.  If narcotic pain medicine is not needed, then you may take acetaminophen (Tylenol), naprosyn (Alleve) or ibuprofen (Advil) as needed. No Tylenol until 5pm! 2. Take your usually prescribed medications unless otherwise directed 3. You should eat very light the first 24 hours after surgery, such as soup, crackers, pudding, etc.  Resume your normal diet the day after surgery. 4. Most patients will experience some swelling and bruising.  Ice packs will help.Swelling and bruising can take several days to resolve.  5. It is common to experience some constipation if taking pain medication after surgery.  Increasing fluid intake and taking a stool softener will usually help or prevent this problem from occurring.  A mild laxative (Milk of Magnesia or Miralax) should be taken according to package directions if there are no bowel movements after 48 hours. 6. Unless discharge instructions indicate otherwise, you may remove your bandages 48 hours after surgery and you may shower at that time.      Any sutures or staples will be removed at the office during your follow-up visit. 7. ACTIVITIES:  You may resume regular daily activities (gradually increasing) beginning the next day.  .  You may have sexual intercourse when it is comfortable. a. You may drive when you no longer are  taking prescription pain medication, you can comfortably wear a seatbelt, and you can safely maneuver your car and apply brakes. b. RETURN TO WORK:  ______________________________________________________________________________________ 8. You should see your doctor in the office for a follow-up appointment approximately two weeks after your surgery.  Your doctors nurse will typically make your follow-up appointment when she calls you with your pathology report.  Expect your pathology report 3-4 business days after your surgery.  You may call to check if you do not hear from [place] after three days. 9. OTHER INSTRUCTIONS: _______________________________________________________________________________________________ _____________________________________________________________________________________________________________________________________ _____________________________________________________________________________________________________________________________________ _____________________________________________________________________________________________________________________________________  WHEN TO CALL DR WAKEFIELD: 1. Fever over 101.0 2. Nausea and/or vomiting. 3. Extreme swelling or bruising. 4. Continued bleeding from incision. 5. Increased pain, redness, or drainage from the incision.  The clinic staff is available to answer your questions during regular business hours.  Please dont hesitate to call and ask to speak to one of the nurses for clinical concerns.  If you have a medical emergency, go to the nearest emergency room or call 911.  A surgeon from [facility] is always on call at the hospital.  For further questions, please visit [email or web address]    Post Anesthesia Home Care Instructions  Activity: Get plenty of rest for the remainder of the day. A responsible individual must stay with you for 24 hours following the procedure.  For the  next 24 hours, DO NOT: -Drive a car -Paediatric nurse -Drink alcoholic beverages -Take any medication unless instructed by your physician -Make any legal decisions or sign important  papers.  Meals: Start with liquid foods such as gelatin or soup. Progress to regular foods as tolerated. Avoid greasy, spicy, heavy foods. If nausea and/or vomiting occur, drink only clear liquids until the nausea and/or vomiting subsides. Call your physician if vomiting continues.  Special Instructions/Symptoms: Your throat may feel dry or sore from the anesthesia or the breathing tube placed in your throat during surgery. If this causes discomfort, gargle with warm salt water. The discomfort should disappear within 24 hours.  If you had a scopolamine patch placed behind your ear for the management of post- operative nausea and/or vomiting:  1. The medication in the patch is effective for 72 hours, after which it should be removed.  Wrap patch in a tissue and discard in the trash. Wash hands thoroughly with soap and water. 2. You may remove the patch earlier than 72 hours if you experience unpleasant side effects which may include dry mouth, dizziness or visual disturbances. 3. Avoid touching the patch. Wash your hands with soap and water after contact with the patch.

## 2018-02-23 NOTE — Interval H&P Note (Signed)
History and Physical Interval Note:  02/23/2018 12:27 PM  Robin Maynard  has presented today for surgery, with the diagnosis of back mass  The various methods of treatment have been discussed with the patient and family. After consideration of risks, benefits and other options for treatment, the patient has consented to  Procedure(s): [place] (N/A) as a surgical intervention .  The patient's history has been reviewed, patient examined, no change in status, stable for surgery.  I have reviewed the patient's chart and labs.  Questions were answered to the patient's satisfaction.     Rolm Bookbinder

## 2018-02-23 NOTE — H&P (Signed)
52 yof referred by Dr Helane Rima for back mass. she has previously been seen for it. It has been present for some time. recently it has gotten bigger again, is draining serous fluid and painful. no history of infection/drainage. this is bothering her alot at work now. she also has for past year undergone evaluation for abd pain which had some haziness around sma which has resolved. she has undergone a couple cts and endoscopy. the ct also noted a small uh that she has absolutely no symptoms from.    Past Surgical History Illene Regulus, CMA; 02/18/2018 8:32 AM) Tonsillectomy   Diagnostic Studies History Illene Regulus, CMA; 02/18/2018 8:32 AM) Colonoscopy  never Mammogram  within last year Pap Smear  1-5 years ago  Allergies Illene Regulus, CMA; 02/18/2018 8:36 AM) No Known Drug Allergies [02/18/2018]:  Medication History (Alisha Spillers, CMA; 02/18/2018 8:36 AM) No Current Medications Medications Reconciled  Family History Illene Regulus, CMA; 02/18/2018 8:32 AM) Arthritis  Mother. Cancer  Father. Diabetes Mellitus  Father. Heart Disease  Father. Heart disease in female family member before age 4  Hypertension  Father.  Pregnancy / Birth History Illene Regulus, CMA; 02/18/2018 8:32 AM) Age at menarche  37 years. Age of menopause  51-54 Contraceptive History  Oral contraceptives. Gravida  1 Irregular periods  Maternal age  53-20 Para  1  Other Problems Illene Regulus, CMA; 02/18/2018 8:32 AM) Gastric Ulcer  Gastroesophageal Reflux Disease  Migraine Headache  Pancreatitis  Umbilical Hernia Repair     Review of Systems (Alisha Spillers CMA; 02/18/2018 8:32 AM) General Present- Fatigue. Not Present- Appetite Loss, Chills, Fever, Night Sweats, Weight Gain and Weight Loss. Skin Present- New Lesions. Not Present- Change in Wart/Mole, Dryness, Hives, Jaundice, Non-Healing Wounds, Rash and Ulcer. HEENT Present- Ringing in the Ears. Not  Present- Earache, Hearing Loss, Hoarseness, Nose Bleed, Oral Ulcers, Seasonal Allergies, Sinus Pain, Sore Throat, Visual Disturbances, Wears glasses/contact lenses and Yellow Eyes. Cardiovascular Present- Leg Cramps and Swelling of Extremities. Not Present- Chest Pain, Difficulty Breathing Lying Down, Palpitations, Rapid Heart Rate and Shortness of Breath. Gastrointestinal Present- Abdominal Pain and Nausea. Not Present- Bloating, Bloody Stool, Change in Bowel Habits, Chronic diarrhea, Constipation, Difficulty Swallowing, Excessive gas, Gets full quickly at meals, Hemorrhoids, Indigestion, Rectal Pain and Vomiting. Female Genitourinary Present- Pelvic Pain. Not Present- Frequency, Nocturia, Painful Urination and Urgency. Musculoskeletal Present- Joint Pain and Joint Stiffness. Not Present- Back Pain, Muscle Pain, Muscle Weakness and Swelling of Extremities. Neurological Present- Headaches. Not Present- Decreased Memory, Fainting, Numbness, Seizures, Tingling, Tremor, Trouble walking and Weakness. Psychiatric Present- Change in Sleep Pattern. Not Present- Anxiety, Bipolar, Depression, Fearful and Frequent crying.  Vitals (Alisha Spillers CMA; 02/18/2018 8:36 AM) 02/18/2018 8:35 AM Weight: 177 lb Height: 63in Body Surface Area: 1.84 m Body Mass Index: 31.35 kg/m  Pulse: 70 (Regular)  BP: 110/72 (Sitting, Left Arm, Standard)       Physical Exam Rolm Bookbinder MD; 02/18/2018 8:52 AM) Chest and Lung Exam Note: 2x2 cm left upper back mass with overyling skin discoloration, has been draining serous fluid   Abdomen Note: soft nt/nd ? umbo hernia on exam     Assessment & Plan Rolm Bookbinder MD; 02/18/2018 8:55 AM) MASS ON BACK (R22.2) Story: Discussed need for colonoscopy. I dont think she needs anything done for a ct only found umbilical hernia. I find it difficult to find on eaxm today. she has no symptoms related to this. I think find to observe and she will call if she  has symptoms. The  back mass I do think it needs to be removed. discussed doing this under local mac at surgery center with external sutures. discussed infection and recurrence.

## 2018-02-23 NOTE — Anesthesia Preprocedure Evaluation (Signed)
Anesthesia Evaluation  Patient identified by MRN, date of birth, ID band Patient awake    Reviewed: Allergy & Precautions, H&P , NPO status , Patient's Chart, lab work & pertinent test results  History of Anesthesia Complications (+) PONV and history of anesthetic complications  Airway Mallampati: II   Neck ROM: full    Dental   Pulmonary neg pulmonary ROS,    breath sounds clear to auscultation       Cardiovascular negative cardio ROS   Rhythm:regular Rate:Normal     Neuro/Psych    GI/Hepatic GERD  ,  Endo/Other  obese  Renal/GU      Musculoskeletal   Abdominal   Peds  Hematology   Anesthesia Other Findings   Reproductive/Obstetrics                             Anesthesia Physical Anesthesia Plan  ASA: II  Anesthesia Plan: MAC   Post-op Pain Management:    Induction: Intravenous  PONV Risk Score and Plan: 3 and Ondansetron, Dexamethasone, Propofol infusion, Midazolam and Treatment may vary due to age or medical condition  Airway Management Planned: Simple Face Mask  Additional Equipment:   Intra-op Plan:   Post-operative Plan:   Informed Consent: I have reviewed the patients History and Physical, chart, labs and discussed the procedure including the risks, benefits and alternatives for the proposed anesthesia with the patient or authorized representative who has indicated his/her understanding and acceptance.     Plan Discussed with: CRNA, Anesthesiologist and Surgeon  Anesthesia Plan Comments:         Anesthesia Quick Evaluation

## 2018-02-23 NOTE — Transfer of Care (Signed)
Immediate Anesthesia Transfer of Care Note  Patient: Robin Maynard  Procedure(s) Performed: EXCISION BACK MASS ERAS PATHWAY (Right Back)  Patient Location: PACU  Anesthesia Type:MAC  Level of Consciousness: awake, alert , oriented and drowsy  Airway & Oxygen Therapy: Patient Spontanous Breathing and Patient connected to face mask oxygen  Post-op Assessment: Report given to RN and Post -op Vital signs reviewed and stable  Post vital signs: Reviewed and stable  Last Vitals:  Vitals Value Taken Time  BP    Temp    Pulse    Resp    SpO2      Last Pain:  Vitals:   02/23/18 1043  TempSrc: Oral         Complications: No apparent anesthesia complications

## 2018-02-24 ENCOUNTER — Encounter (HOSPITAL_BASED_OUTPATIENT_CLINIC_OR_DEPARTMENT_OTHER): Payer: Self-pay | Admitting: General Surgery

## 2018-02-24 NOTE — Anesthesia Postprocedure Evaluation (Signed)
Anesthesia Post Note  Patient: Robin Maynard  Procedure(s) Performed: EXCISION BACK MASS ERAS PATHWAY (Right Back)     Patient location during evaluation: PACU Anesthesia Type: MAC Level of consciousness: awake and alert Pain management: pain level controlled Vital Signs Assessment: post-procedure vital signs reviewed and stable Respiratory status: spontaneous breathing, nonlabored ventilation, respiratory function stable and patient connected to nasal cannula oxygen Cardiovascular status: stable and blood pressure returned to baseline Postop Assessment: no apparent nausea or vomiting Anesthetic complications: no    Last Vitals:  Vitals:   02/23/18 1354 02/23/18 1406  BP:  104/70  Pulse: 60 (!) 54  Resp: 13 16  Temp:  (!) 36.1 C  SpO2: 97% 100%    Last Pain:  Vitals:   02/23/18 1406  TempSrc: Axillary  PainSc: 0-No pain   Pain Goal:                 Sharlize Hoar S

## 2018-03-08 ENCOUNTER — Ambulatory Visit (HOSPITAL_COMMUNITY): Admission: RE | Admit: 2018-03-08 | Payer: 59 | Source: Ambulatory Visit

## 2018-04-07 ENCOUNTER — Other Ambulatory Visit: Payer: Self-pay

## 2018-04-07 ENCOUNTER — Ambulatory Visit (AMBULATORY_SURGERY_CENTER): Payer: Self-pay | Admitting: *Deleted

## 2018-04-07 ENCOUNTER — Encounter: Payer: Self-pay | Admitting: Gastroenterology

## 2018-04-07 VITALS — Ht 63.0 in | Wt 178.8 lb

## 2018-04-07 DIAGNOSIS — R1012 Left upper quadrant pain: Secondary | ICD-10-CM

## 2018-04-07 DIAGNOSIS — Z01419 Encounter for gynecological examination (general) (routine) without abnormal findings: Secondary | ICD-10-CM | POA: Diagnosis not present

## 2018-04-07 DIAGNOSIS — Z6830 Body mass index (BMI) 30.0-30.9, adult: Secondary | ICD-10-CM | POA: Diagnosis not present

## 2018-04-07 MED ORDER — SUPREP BOWEL PREP KIT 17.5-3.13-1.6 GM/177ML PO SOLN: 1.0000 | Freq: Once | ORAL | 0 refills | Status: AC

## 2018-04-07 MED FILL — SUPREP BOWEL PREP KIT: 17.5-3.13-1 | 1 days supply | Qty: 354 | Fill #0

## 2018-04-07 MED FILL — ZOLPIDEM TARTRATE 10 MG TAB: 10 | 30 days supply | Qty: 30 | Fill #0

## 2018-04-07 NOTE — Progress Notes (Signed)
No egg or soy allergy known to patient  No issues with past sedation with any surgeries  or procedures, no intubation problems  No diet pills per patient No home 02 use per patient  No blood thinners per patient  Pt denies issues with constipation  No A fib or A flutter  EMMI video offered yet declined per patient.

## 2018-04-09 MED FILL — VALACYCLOVIR HCL 500 MG TAB: 500 | 32 days supply | Qty: 180 | Fill #0

## 2018-04-14 ENCOUNTER — Encounter: Payer: 59 | Admitting: Gastroenterology

## 2018-04-21 ENCOUNTER — Encounter: Payer: Self-pay | Admitting: Gastroenterology

## 2018-04-21 ENCOUNTER — Ambulatory Visit (AMBULATORY_SURGERY_CENTER): Payer: 59 | Admitting: Gastroenterology

## 2018-04-21 VITALS — BP 131/71 | HR 65 | Temp 98.9°F | Resp 15 | Ht 63.0 in | Wt 178.0 lb

## 2018-04-21 DIAGNOSIS — K649 Unspecified hemorrhoids: Secondary | ICD-10-CM

## 2018-04-21 DIAGNOSIS — D125 Benign neoplasm of sigmoid colon: Secondary | ICD-10-CM | POA: Diagnosis not present

## 2018-04-21 DIAGNOSIS — D126 Benign neoplasm of colon, unspecified: Secondary | ICD-10-CM | POA: Diagnosis not present

## 2018-04-21 DIAGNOSIS — R1012 Left upper quadrant pain: Secondary | ICD-10-CM | POA: Diagnosis not present

## 2018-04-21 DIAGNOSIS — D124 Benign neoplasm of descending colon: Secondary | ICD-10-CM | POA: Diagnosis not present

## 2018-04-21 DIAGNOSIS — Z1211 Encounter for screening for malignant neoplasm of colon: Secondary | ICD-10-CM

## 2018-04-21 DIAGNOSIS — K635 Polyp of colon: Secondary | ICD-10-CM | POA: Diagnosis not present

## 2018-04-21 MED ORDER — SODIUM CHLORIDE 0.9 % IV SOLN: 500.0000 mL | Freq: Once | INTRAVENOUS | Status: DC

## 2018-04-21 NOTE — Progress Notes (Signed)
Pt's states no medical or surgical changes since previsit or office visit.  Dr. Lenon Ahmadi screening.

## 2018-04-21 NOTE — Patient Instructions (Signed)
YOU HAD AN ENDOSCOPIC PROCEDURE TODAY AT [facility]:   Refer to the procedure report that was given to you for any specific questions about what was found during the examination.  If the procedure report does not answer your questions, please call your gastroenterologist to clarify.  If you requested that your care partner not be given the details of your procedure findings, then the procedure report has been included in a sealed envelope for you to review at your convenience later.  YOU SHOULD EXPECT: Some feelings of bloating in the abdomen. Passage of more gas than usual.  Walking can help get rid of the air that was put into your GI tract during the procedure and reduce the bloating. If you had a lower endoscopy (such as a colonoscopy or flexible sigmoidoscopy) you may notice spotting of blood in your stool or on the toilet paper. If you underwent a bowel prep for your procedure, you may not have a normal bowel movement for a few days.  Please Note:  You might notice some irritation and congestion in your nose or some drainage.  This is from the oxygen used during your procedure.  There is no need for concern and it should clear up in a day or so.  SYMPTOMS TO REPORT IMMEDIATELY:   Following lower endoscopy (colonoscopy or flexible sigmoidoscopy):  Excessive amounts of blood in the stool  Significant tenderness or worsening of abdominal pains  Swelling of the abdomen that is new, acute  Fever of 100F or higher  For urgent or emergent issues, a gastroenterologist can be reached at any hour by calling [phone].   DIET:  We do recommend a small meal at first, but then you may proceed to your regular diet.  Drink plenty of fluids but you should avoid alcoholic beverages for 24 hours.  ACTIVITY:  You should plan to take it easy for the rest of today and you should NOT DRIVE or use heavy machinery until tomorrow (because of the sedation medicines used during the test).     FOLLOW UP: Our staff will call the number listed on your records the next business day following your procedure to check on you and address any questions or concerns that you may have regarding the information given to you following your procedure. If we do not reach you, we will leave a message.  However, if you are feeling well and you are not experiencing any problems, there is no need to return our call.  We will assume that you have returned to your regular daily activities without incident.  If any biopsies were taken you will be contacted by phone or by letter within the next 1-3 weeks.  Please call us at [phone] if you have not heard about the biopsies in 3 weeks.   Await for biopsy results Polyps (handout given) Contrast given Office will follow with patient for CT scan and follow up labs  SIGNATURES/CONFIDENTIALITY: You and/or your care partner have signed paperwork which will be entered into your electronic medical record.  These signatures attest to the fact that that the information above on your After Visit Summary has been reviewed and is understood.  Full responsibility of the confidentiality of this discharge information lies with you and/or your care-partner.

## 2018-04-21 NOTE — Op Note (Signed)
[facility] Patient Name: Robin Maynard Procedure Date: 04/21/2018 1:52 PM MRN: [id number] Endoscopist: Milus Banister , MD Age: 52 Referring MD:  Date of Birth: 29-Sep-1965 Gender: Female Account #: [id number] Procedure:                Colonoscopy Indications:              Screening for colorectal malignant neoplasm Medicines:                Monitored Anesthesia Care Procedure:                Pre-Anesthesia Assessment:                           - Prior to the procedure, a History and Physical                            was performed, and patient medications and                            allergies were reviewed. The patient's tolerance of                            previous anesthesia was also reviewed. The risks                            and benefits of the procedure and the sedation                            options and risks were discussed with the patient.                            All questions were answered, and informed consent                            was obtained. Prior Anticoagulants: The patient has                            taken no previous anticoagulant or antiplatelet                            agents. ASA Grade Assessment: II - A patient with                            mild systemic disease. After reviewing the risks                            and benefits, the patient was deemed in                            satisfactory condition to undergo the procedure.                           After obtaining informed consent, the colonoscope  was passed under direct vision. Throughout the                            procedure, the patient's blood pressure, pulse, and                            oxygen saturations were monitored continuously. The                            Colonoscope was introduced through the anus and                            advanced to the the terminal ileum. The colonoscopy                            was  performed without difficulty. The patient                            tolerated the procedure well. The quality of the                            bowel preparation was good. The terminal ileum,                            ileocecal valve, appendiceal orifice, and rectum                            were photographed. Scope In: 2:15:01 PM Scope Out: 2:26:49 PM Scope Withdrawal Time: 0 hours 8 minutes 10 seconds  Total Procedure Duration: 0 hours 11 minutes 48 seconds  Findings:                 Two sessile polyps were found in the sigmoid colon                            and descending colon. The polyps were 3 to 4 mm in                            size. These polyps were removed with a cold snare.                            Resection and retrieval were complete.                           External hemorrhoids were found. The hemorrhoids                            were small.                           The exam was otherwise without abnormality on                            direct and retroflexion views. Complications:  No immediate complications. Estimated blood loss:                            None. Estimated Blood Loss:     Estimated blood loss: none. Impression:               - Two 3 to 4 mm polyps in the sigmoid colon and in                            the descending colon, removed with a cold snare.                            Resected and retrieved.                           - External hemorrhoids.                           - The examination was otherwise normal on direct                            and retroflexion views. Recommendation:           - Patient has a contact number available for                            emergencies. The signs and symptoms of potential                            delayed complications were discussed with the                            patient. Return to normal activities tomorrow.                            Written discharge instructions were provided  to the                            patient.                           - Resume previous diet.                           - Continue present medications.                           - Dr. Ardis Hughs' office will arrange CT scan                            abd/pelvis with IV and oral contrast (pancreatic                            protocol) for persistent abdominal pain (LUQ).                           -  Dr. Ardis Hughs' office will also arrange labs in 3-4                            weeks (tTG, total IgA level), please come in for                            those labs after resuming gluten,carbohydrates in                            your diet until then.                           You will receive a letter within 2-3 weeks with the                            pathology results and my final recommendations.                           If the polyp(s) is proven to be 'pre-cancerous' on                            pathology, you will need repeat colonoscopy in 5                            years. If the polyp(s) is NOT 'precancerous' on                            pathology then you should repeat colon cancer                            screening in 10 years with colonoscopy without need                            for colon cancer screening by any method prior to                            then (including stool testing). Milus Banister, MD 04/21/2018 2:32:54 PM This report has been signed electronically.

## 2018-04-21 NOTE — Progress Notes (Signed)
Report given to PACU, vss 

## 2018-04-21 NOTE — Progress Notes (Signed)
Called to room to assist during endoscopic procedure.  Patient ID and intended procedure confirmed with present staff. Received instructions for my participation in the procedure from the performing physician.  

## 2018-04-22 ENCOUNTER — Telehealth: Payer: Self-pay

## 2018-04-22 NOTE — Telephone Encounter (Signed)
  Follow up Call-  Call back number 01/20/2017  Post procedure Call Back phone  # [phone]  Permission to leave phone message Yes  Some encounter information is confidential and restricted. Go to Review Flowsheets activity to see all data.  Some recent data might be hidden     Patient questions:  Do you have a fever, pain , or abdominal swelling? No. Pain Score  0 *  Have you tolerated food without any problems? Yes.    Have you been able to return to your normal activities? Yes.    Do you have any questions about your discharge instructions: Diet   No. Medications  No. Follow up visit  No.  Do you have questions or concerns about your Care? No.  Actions: * If pain score is 4 or above: No action needed, pain <4.  No problems noted per pt.  "you guys were great", pt commented. maw

## 2018-04-23 ENCOUNTER — Other Ambulatory Visit: Payer: Self-pay | Admitting: Family Medicine

## 2018-04-23 DIAGNOSIS — Q453 Other congenital malformations of pancreas and pancreatic duct: Secondary | ICD-10-CM

## 2018-04-26 ENCOUNTER — Other Ambulatory Visit: Payer: Self-pay

## 2018-04-26 DIAGNOSIS — R1012 Left upper quadrant pain: Secondary | ICD-10-CM

## 2018-04-28 ENCOUNTER — Encounter: Payer: Self-pay | Admitting: Gastroenterology

## 2018-05-11 ENCOUNTER — Other Ambulatory Visit: Payer: Self-pay | Admitting: Obstetrics and Gynecology

## 2018-05-11 DIAGNOSIS — Z1231 Encounter for screening mammogram for malignant neoplasm of breast: Secondary | ICD-10-CM

## 2018-05-25 ENCOUNTER — Ambulatory Visit (HOSPITAL_COMMUNITY): Payer: 59

## 2018-05-26 ENCOUNTER — Ambulatory Visit (HOSPITAL_COMMUNITY)
Admission: RE | Admit: 2018-05-26 | Discharge: 2018-05-26 | Disposition: A | Discharge status: Home / Self Care | Payer: 59 | Source: Ambulatory Visit | Attending: Family Medicine | Admitting: Family Medicine

## 2018-05-26 ENCOUNTER — Encounter (HOSPITAL_COMMUNITY): Payer: Self-pay

## 2018-05-26 DIAGNOSIS — Q453 Other congenital malformations of pancreas and pancreatic duct: Secondary | ICD-10-CM | POA: Diagnosis present

## 2018-05-26 DIAGNOSIS — R1012 Left upper quadrant pain: Secondary | ICD-10-CM | POA: Diagnosis not present

## 2018-05-26 MED ORDER — IOHEXOL 300 MG/ML  SOLN
100.0000 mL | Freq: Once | INTRAMUSCULAR | Status: AC | PRN
  Administered 2018-05-26: 100 mL via INTRAVENOUS

## 2018-05-26 MED ORDER — SODIUM CHLORIDE 0.9 % IJ SOLN
INTRAMUSCULAR | Status: AC
  Filled 2018-05-26: qty 50

## 2018-06-08 DIAGNOSIS — R5383 Other fatigue: Secondary | ICD-10-CM | POA: Diagnosis not present

## 2018-06-08 DIAGNOSIS — R430 Anosmia: Secondary | ICD-10-CM | POA: Diagnosis not present

## 2018-06-08 LAB — TSH: TSH: 1.48 (ref <=5.90)

## 2018-06-15 ENCOUNTER — Telehealth: Payer: Self-pay | Admitting: Internal Medicine

## 2018-06-15 NOTE — Telephone Encounter (Signed)
Patient called today requesting an appointment with Dr. Cruzita Lederer. I scheduled patient for 09/28/18. Patient has a thyroid antibody. Patient is requesting an appointment sooner than 09/28/18. Please call patient at ph# [phone] to advise

## 2018-06-15 NOTE — Telephone Encounter (Signed)
Can I also reviewed these results before scheduling her earlier?  I would need the results of the thyroid antibody and any new thyroid tests that she may have.

## 2018-06-16 ENCOUNTER — Ambulatory Visit: Payer: 59

## 2018-06-16 NOTE — Telephone Encounter (Signed)
I could see her sooner, but I want to make sure that this is actually needed.  Please see my previous message about the thyroid antibodies and tests.

## 2018-06-16 NOTE — Telephone Encounter (Signed)
LM for patient that we need a copy of her lab results.

## 2018-06-16 NOTE — Telephone Encounter (Signed)
Please advise if patient can be seen sooner that 2/25

## 2018-06-18 ENCOUNTER — Telehealth: Payer: Self-pay | Admitting: Internal Medicine

## 2018-06-18 NOTE — Telephone Encounter (Signed)
See other phone note opened in error.  Robin Maynard 8 minutes ago (2:36 PM)      PCP said that patient's labs are in the [facility] as well as the referral-per patient's PCP. Patient's ph# [phone].      Documentation

## 2018-06-18 NOTE — Telephone Encounter (Signed)
I am sorry, I looked everywhere, but do not have them and do not have access to seeing them online.  Can we ask the PCP to send me her thyroid antibodies and latest thyroid test?

## 2018-06-18 NOTE — Telephone Encounter (Signed)
PCP said that patient's labs are in the [facility] as well as the referral-per patient's PCP. Patient's ph# [phone].

## 2018-06-18 NOTE — Telephone Encounter (Signed)
See other note regarding this. Duplicate.

## 2018-06-21 NOTE — Telephone Encounter (Signed)
Request sent to PCP

## 2018-06-22 ENCOUNTER — Telehealth: Payer: Self-pay | Admitting: Internal Medicine

## 2018-06-22 NOTE — Telephone Encounter (Signed)
Reviewed patient's labs.  Her ATA antibodies are elevated.  This is indicative of Hashimoto's thyroiditis, which is a common autoimmune condition caused by antibodies directed against her own thyroid.   Since her thyroid tests are normal, no intervention is needed for now.  We could schedule an appointment sooner, first available, if she prefers to discuss more about Hashimoto's thyroiditis, but we can also definitely wait until February to recheck her thyroid tests.

## 2018-06-22 NOTE — Telephone Encounter (Signed)
Patient called regarding the following:  Patient states that the lab work that Dr. Cruzita Lederer requested was sent on a separate referral. PCP told patient that information was sent on [facility].   Does Dr. Cruzita Lederer need any lab work drawn prior to patient's appointment.   Patient would like to be seen sooner than February. Please call patient at ph# [phone] to advise.

## 2018-06-22 NOTE — Telephone Encounter (Signed)
No, no need to repeat labs before the appointment.

## 2018-06-22 NOTE — Telephone Encounter (Signed)
Please advise if patient needs to come in for labs prior to appointment so that I can call her back

## 2018-06-22 NOTE — Telephone Encounter (Signed)
Fax received from PCP and given to Dr. Cruzita Lederer  TSH 1.48  Free T4 0.75  Thyroglobulin Ab 6.7  TPO 10  Free T3 3.38  LH 36.9  FSH 53.7

## 2018-06-23 NOTE — Telephone Encounter (Signed)
Gave patient results from note below and she stated understanding- patient states she is feeling symptomatic and would like to discuss her Dx further I have scheduled her to come in 06/25/18 to discuss

## 2018-06-23 NOTE — Telephone Encounter (Signed)
Patient informed. 

## 2018-06-23 NOTE — Telephone Encounter (Signed)
LMTCB to discuss note below 

## 2018-06-25 ENCOUNTER — Encounter: Payer: Self-pay | Admitting: Internal Medicine

## 2018-06-25 ENCOUNTER — Ambulatory Visit: Payer: 59 | Admitting: Internal Medicine

## 2018-06-25 VITALS — BP 118/80 | HR 67 | Ht 63.0 in | Wt 181.0 lb

## 2018-06-25 DIAGNOSIS — R7989 Other specified abnormal findings of blood chemistry: Secondary | ICD-10-CM

## 2018-06-25 DIAGNOSIS — R5382 Chronic fatigue, unspecified: Secondary | ICD-10-CM

## 2018-06-25 DIAGNOSIS — E063 Autoimmune thyroiditis: Secondary | ICD-10-CM

## 2018-06-25 DIAGNOSIS — E2740 Unspecified adrenocortical insufficiency: Secondary | ICD-10-CM

## 2018-06-25 NOTE — Patient Instructions (Signed)
Try to start: - Selenium 200 mcg daily  Please come back for repeat labs in ~4 months.

## 2018-06-25 NOTE — Progress Notes (Signed)
HPI  Robin Maynard is a 52 y.o.-year-old female, whom I previously saw for low cortisol level (we ruled out adrenal insufficiency), now returning for new diagnosis of Hashimoto's thyroiditis.  Patient describes that approximately 1.5 years ago his she started to be very tired and this exacerbated over the summer.  However, since 04/2018 she feels that her fatigue improved slightly.  Her PCP investigated her for possible autoimmune diseases and also chronic viral infections and the investigation was negative except for elevated thyroglobulin antibodies.  She was referred back to endocrinology.  Pt. has has been recently found to have high TPO antibodies: Thyroglobulin Ab 6.7 TPO 10  She is scheduled this appointment to discuss about her new diagnosis of Hashimoto's thyroiditis.  Of note, her TFTs are all: Lab Results  Component Value Date   TSH 1.48 06/08/2018  Free T4 0.75 Free T3 3.38  Pt denies: - feeling nodules in neck - hoarseness - dysphagia - choking - SOB with lying down  Pt describes: - insomnia - increased fatigue, a little improved in last 2 mo; lack of stamina; feels that she is not responding well to stress - mood swings - sensitive to temperature extremes  Pt denies feeling nodules in neck, hoarseness, dysphagia/odynophagia, SOB with lying down.  She has no FH of thyroid disorders. No FH of thyroid cancer. No h/o radiation tx to head or neck.  No seaweed or kelp. No recent herbal supplements.  Pt. also has a history of low cortisol.  We ruled out adrenal insufficiency at last visit:  03/19/2017: cortisol 4.9 (am)  Office Visit on 08/10/2017  Component Date Value Ref Range Status  . Y073 ACTH 08/10/2017 20  6 - 50 pg/mL Final   Comment: Reference range applies only to specimens collected between 7am-10am   . Cortisol, Plasma 08/10/2017 7.7  ug/dL Final   AM:  4.3 - 22.4 ug/dLPM:  3.1 - 16.7 ug/dL  . Cortisol, Plasma 08/10/2017 19.2  ug/dL Final   AM:  4.3 - 22.4 ug/dLPM:  3.1 - 16.7 ug/dL  . Cortisol, Plasma 08/10/2017 22.7  ug/dL Final   AM:  4.3 - 22.4 ug/dLPM:  3.1 - 16.7 ug/dL   All labs normal (including stimulated cortisol >18.5) >> no evidence of adrenal insufficiency.  ROS: Constitutional: + see HPI Eyes: no blurry vision, no xerophthalmia ENT: no sore throat, + see HPI Cardiovascular: no CP/+ SOB/+ palpitations/+ leg swelling Respiratory: no cough/+ SOB Gastrointestinal: no N/V/D/C Musculoskeletal: + Both: Muscle/joint aches Skin: no rashes Neurological: no tremors/numbness/tingling/dizziness  Past Medical History:  Diagnosis Date  . Acute pancreatitis   . Constipation   . Gastritis   . GERD (gastroesophageal reflux disease)    GI work up in progress  . Mass on back   . Obesity   . PONV (postoperative nausea and vomiting)    Past Surgical History:  Procedure Laterality Date  . DILITATION & CURRETTAGE/HYSTROSCOPY WITH HYDROTHERMAL ABLATION N/A 12/26/2016   Procedure: DILATATION & CURETTAGE/HYSTEROSCOPY WITH HYDROTHERMAL ABLATION;  Surgeon: Dian Queen, MD;  Location: [facility];  Service: Gynecology;  Laterality: N/A;  . MASS EXCISION Right 02/23/2018   Procedure: EXCISION BACK MASS ERAS PATHWAY;  Surgeon: Rolm Bookbinder, MD;  Location: [facility];  Service: General;  Laterality: Right;  . TONSILLECTOMY AND ADENOIDECTOMY    . TYMPANOSTOMY TUBE PLACEMENT     Social History   Socioeconomic History  . Marital status: Married    Spouse name: Not on file  . Number of  children: Not on file  . Years of education: Not on file  . Highest education level: Not on file  Occupational History  . Not on file  Social Needs  . Financial resource strain: Not on file  . Food insecurity:    Worry: Not on file    Inability: Not on file  . Transportation needs:    Medical: Not on file    Non-medical: Not on file  Tobacco Use  . Smoking status: Never Smoker  . Smokeless tobacco:  Never Used  Substance and Sexual Activity  . Alcohol use: No  . Drug use: No  . Sexual activity: Not on file    Comment: ablation  Lifestyle  . Physical activity:    Days per week: Not on file    Minutes per session: Not on file  . Stress: Not on file  Relationships  . Social connections:    Talks on phone: Not on file    Gets together: Not on file    Attends religious service: Not on file    Active member of club or organization: Not on file    Attends meetings of clubs or organizations: Not on file    Relationship status: Not on file  . Intimate partner violence:    Fear of current or ex partner: Not on file    Emotionally abused: Not on file    Physically abused: Not on file    Forced sexual activity: Not on file  Other Topics Concern  . Not on file  Social History Narrative  . Not on file   Current Outpatient Medications on File Prior to Visit  Medication Sig Dispense Refill  . valACYclovir (VALTREX) 500 MG tablet Take 500 mg by mouth daily as needed (breakout).     Marland Kitchen zolpidem (AMBIEN) 10 MG tablet      Current Facility-Administered Medications on File Prior to Visit  Medication Dose Route Frequency Provider Last Rate Last Dose  . 0.9 %  sodium chloride infusion  500 mL Intravenous Once Milus Banister, MD       No Known Allergies Family History  Problem Relation Age of Onset  . Gastric cancer Father   . Diabetes Father   . Heart disease Father   . Hypertension Father   . Stomach cancer Father   . Deep vein thrombosis Mother   . Colon polyps Mother   . Heart disease Mother   . Diabetes Paternal Grandmother   . Heart disease Paternal Grandmother   . Breast cancer Maternal Aunt   . Colon cancer Neg Hx   . Allergic rhinitis Neg Hx   . Asthma Neg Hx   . Angioedema Neg Hx   . Eczema Neg Hx   . Urticaria Neg Hx   . Rectal cancer Neg Hx   . Esophageal cancer Neg Hx     PE: BP 118/80   Pulse 67   Ht 5\' 3"  (1.6 m)   Wt 181 lb (82.1 kg)   SpO2 97%   BMI  32.06 kg/m  Wt Readings from Last 3 Encounters:  06/25/18 181 lb (82.1 kg)  04/21/18 178 lb (80.7 kg)  04/07/18 178 lb 12.8 oz (81.1 kg)   Constitutional: overweight, in NAD Eyes: PERRLA, EOMI, no exophthalmos ENT: moist mucous membranes, no thyromegaly, no cervical lymphadenopathy Cardiovascular: RRR, No MRG Respiratory: CTA B Gastrointestinal: abdomen soft, NT, ND, BS+ Musculoskeletal: no deformities, strength intact in all 4 Skin: moist, warm, no rashes Neurological: no tremor with  outstretched hands, DTR normal in all 4  ASSESSMENT: 1. Hashimoto thyroiditis  2. H/o low cortisol  3.  Fatigue  PLAN: 1. Hashimoto thyroiditis - I reviewed the results of her recent thyroid antibody tests along with the patient. I pointed out that a high level of TPO or ATA antibodies is indicative of Hashimoto's thyroiditis.   - Pt does not have a thyroid cancer family history or a personal history of RxTx to head/neck >> not at high risk for [facility]. - we had a long discussion about her Hashimoto thyroiditis diagnosis. I explained that this is an autoimmune disorder, in which she develops antibodies against her own thyroid. The antibodies bind to the thyroid tissue and cause inflammation, and, eventually, destruction of the gland and hypothyroidism. We don't know how long this process can be, it can last from months to years. As of now, based on the last results that I have, her thyroid tests are normal.  She is wondering whether a low dose of levothyroxine may help, but I recommend against it since her TSH was perfect.  We discussed about possible side effects of overtreatment with levothyroxine. - I also explained that thyroid enlargement especially at the beginning of her Hashimoto thyroiditis course is not uncommon, and it has a waxing and waning character.  She did not feel that this has enlarged so far. - We discussed about treatment for Hashimoto thyroiditis, which is actually limited to thyroid  hormones in case her TFTs are abnormal. Supplements like selenium has been tried with various results, some showing improvement in the TPO antibodies. However, there are no randomized controlled trials of this are consistent results between trials. We also discussed about ways to improve her immune system (relaxation, diet, exercise, sleep) to reduce the Ab titer and, subsequently, the thyroid inflammation. - We will plan to repeat her TFTs and ATA antibodies in 4 months - I will see her back in a year  2. H/o low cortisol -Reviewed previous investigation: She had a low cortisol level, however, a cosyntropin stimulation test was normal.  3.  Fatigue -Reviewed labs per PCP -We ruled out adrenal insufficiency in 08/2017 -She has celiac labs pending with Dr. Ardis Hughs.  He advised her to stop her gluten-free diet before testing.  She tells me that this is difficult for her to do due to GI symptoms if she restarts it. -Her PCP left practice and she will need to establish care with another PCP.  We discussed that she may need to have general labs including CMP, CBC, vitamin B-12, vitamin D, etc. when she sees her PCP  -We also discussed that her symptoms could be related to menopause.  She has an OB/GYN and I advised her to discuss with them about possibly starting HRT.  Patient Instructions  Try to start: - Selenium 200 mcg daily  Please come back for repeat labs in ~4 months.  Orders Placed This Encounter  Procedures  . TSH  . T4, free  . T3, free  . Thyroglobulin antibody   Philemon Kingdom, MD PhD [facility]

## 2018-06-29 NOTE — Telephone Encounter (Signed)
error 

## 2018-07-05 MED FILL — VALACYCLOVIR HCL 500 MG TAB: 500 | 32 days supply | Qty: 180 | Fill #1

## 2018-07-05 MED FILL — METHYLPREDNISOLONE 4 MG TAB: 4 | 6 days supply | Qty: 21 | Fill #1

## 2018-07-05 MED FILL — ZOLPIDEM TARTRATE 10 MG TAB: 10 | 30 days supply | Qty: 30 | Fill #1

## 2018-07-14 ENCOUNTER — Ambulatory Visit (HOSPITAL_COMMUNITY)
Admission: RE | Admit: 2018-07-14 | Discharge: 2018-07-14 | Disposition: A | Discharge status: Home / Self Care | Payer: 59 | Source: Ambulatory Visit | Attending: Orthopedic Surgery | Admitting: Orthopedic Surgery

## 2018-07-14 ENCOUNTER — Other Ambulatory Visit (HOSPITAL_COMMUNITY): Payer: Self-pay | Admitting: Orthopedic Surgery

## 2018-07-14 DIAGNOSIS — R52 Pain, unspecified: Secondary | ICD-10-CM

## 2018-07-14 DIAGNOSIS — S99921A Unspecified injury of right foot, initial encounter: Secondary | ICD-10-CM | POA: Diagnosis not present

## 2018-07-26 MED FILL — ZOLPIDEM TARTRATE 10 MG TAB: 10 | 30 days supply | Qty: 30 | Fill #1

## 2018-07-26 MED FILL — VALACYCLOVIR HCL 500 MG TAB: 500 | 32 days supply | Qty: 180 | Fill #1

## 2018-07-26 MED FILL — METHYLPREDNISOLONE 4 MG TAB: 4 | 6 days supply | Qty: 21 | Fill #1

## 2018-08-02 ENCOUNTER — Ambulatory Visit: Payer: 59

## 2018-09-03 ENCOUNTER — Ambulatory Visit
Admission: RE | Admit: 2018-09-03 | Discharge: 2018-09-03 | Disposition: A | Discharge status: Home / Self Care | Payer: 59 | Source: Ambulatory Visit | Attending: Obstetrics and Gynecology | Admitting: Obstetrics and Gynecology

## 2018-09-03 DIAGNOSIS — Z1231 Encounter for screening mammogram for malignant neoplasm of breast: Secondary | ICD-10-CM

## 2018-09-06 ENCOUNTER — Other Ambulatory Visit: Payer: Self-pay | Admitting: Obstetrics and Gynecology

## 2018-09-06 DIAGNOSIS — R928 Other abnormal and inconclusive findings on diagnostic imaging of breast: Secondary | ICD-10-CM

## 2018-09-08 ENCOUNTER — Other Ambulatory Visit: Payer: 59

## 2018-09-08 ENCOUNTER — Ambulatory Visit
Admission: RE | Admit: 2018-09-08 | Discharge: 2018-09-08 | Disposition: A | Discharge status: Home / Self Care | Payer: 59 | Source: Ambulatory Visit | Attending: Obstetrics and Gynecology | Admitting: Obstetrics and Gynecology

## 2018-09-08 ENCOUNTER — Other Ambulatory Visit: Payer: Self-pay | Admitting: Obstetrics and Gynecology

## 2018-09-08 DIAGNOSIS — R928 Other abnormal and inconclusive findings on diagnostic imaging of breast: Secondary | ICD-10-CM

## 2018-09-08 DIAGNOSIS — N6489 Other specified disorders of breast: Secondary | ICD-10-CM

## 2018-09-08 DIAGNOSIS — N6011 Diffuse cystic mastopathy of right breast: Secondary | ICD-10-CM | POA: Diagnosis not present

## 2018-09-13 ENCOUNTER — Other Ambulatory Visit: Payer: 59

## 2018-09-22 ENCOUNTER — Telehealth: Payer: Self-pay | Admitting: Internal Medicine

## 2018-09-22 NOTE — Telephone Encounter (Signed)
Patient called re: Dr. Cruzita Lederer had told patient about making an appointment with a PCP named Doctor Rosendo Gros located on [address]. Patient cannot locate Dr. Rosendo Gros. Please call patient at ph# [phone] to advise.

## 2018-09-23 NOTE — Telephone Encounter (Signed)
I am not aware of this Dr. So I could not have recommended him... (???). Does she [facility]? She is a PCP at our [place] office.

## 2018-09-28 ENCOUNTER — Ambulatory Visit: Payer: 59 | Admitting: Internal Medicine

## 2018-09-28 ENCOUNTER — Encounter

## 2018-10-25 ENCOUNTER — Telehealth: Payer: Self-pay

## 2018-10-25 NOTE — Telephone Encounter (Signed)
Spoke with the patient. The patient was asked to reschedule the appointment for April due to attempts to limit possible Covid_19 exposure. The patient was very agitated and stated that "new patients have medical needs too. 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 The appointment was canceled and she will call back to reschedule. Nothing further needed.

## 2018-10-26 ENCOUNTER — Other Ambulatory Visit: Payer: Self-pay

## 2018-10-26 ENCOUNTER — Other Ambulatory Visit (INDEPENDENT_AMBULATORY_CARE_PROVIDER_SITE_OTHER): Payer: 59

## 2018-10-26 DIAGNOSIS — E063 Autoimmune thyroiditis: Secondary | ICD-10-CM

## 2018-10-27 LAB — THYROGLOBULIN ANTIBODY: Thyroglobulin Ab: 6 IU/mL — ABNORMAL HIGH (ref <=1)

## 2018-10-27 LAB — T3, FREE: T3, Free: 3.1 pg/mL (ref 2.3–4.2)

## 2018-10-27 LAB — TSH: TSH: 1.41 u[IU]/mL (ref 0.35–4.50)

## 2018-10-27 LAB — T4, FREE: Free T4: 1.01 ng/dL (ref 0.60–1.60)

## 2018-10-29 ENCOUNTER — Ambulatory Visit: Payer: 59 | Admitting: Family Medicine

## 2018-10-29 DIAGNOSIS — R946 Abnormal results of thyroid function studies: Secondary | ICD-10-CM | POA: Diagnosis not present

## 2018-10-29 DIAGNOSIS — R5382 Chronic fatigue, unspecified: Secondary | ICD-10-CM | POA: Diagnosis not present

## 2018-10-29 DIAGNOSIS — R600 Localized edema: Secondary | ICD-10-CM | POA: Diagnosis not present

## 2018-10-29 DIAGNOSIS — R0789 Other chest pain: Secondary | ICD-10-CM | POA: Diagnosis not present

## 2018-11-04 ENCOUNTER — Ambulatory Visit (HOSPITAL_COMMUNITY)
Admission: RE | Admit: 2018-11-04 | Discharge: 2018-11-04 | Disposition: A | Discharge status: Home / Self Care | Payer: 59 | Source: Ambulatory Visit | Attending: Family Medicine | Admitting: Family Medicine

## 2018-11-04 ENCOUNTER — Other Ambulatory Visit: Payer: Self-pay

## 2018-11-04 ENCOUNTER — Other Ambulatory Visit (HOSPITAL_COMMUNITY): Payer: Self-pay | Admitting: Family Medicine

## 2018-11-04 DIAGNOSIS — R0789 Other chest pain: Secondary | ICD-10-CM | POA: Insufficient documentation

## 2018-11-04 DIAGNOSIS — R079 Chest pain, unspecified: Secondary | ICD-10-CM | POA: Diagnosis not present

## 2018-11-10 ENCOUNTER — Other Ambulatory Visit (HOSPITAL_COMMUNITY): Payer: Self-pay | Admitting: Family Medicine

## 2018-11-10 DIAGNOSIS — R0789 Other chest pain: Secondary | ICD-10-CM

## 2018-11-10 DIAGNOSIS — R609 Edema, unspecified: Secondary | ICD-10-CM

## 2018-11-25 MED FILL — VALACYCLOVIR HCL 500 MG TAB: 500 | 90 days supply | Qty: 180 | Fill #0

## 2018-11-25 MED FILL — ZOLPIDEM TARTRATE 10 MG TAB: 10 | 30 days supply | Qty: 30 | Fill #0

## 2018-12-14 ENCOUNTER — Telehealth (HOSPITAL_COMMUNITY): Payer: Self-pay | Admitting: *Deleted

## 2018-12-14 NOTE — Telephone Encounter (Signed)

## 2018-12-16 ENCOUNTER — Other Ambulatory Visit: Payer: Self-pay

## 2018-12-16 ENCOUNTER — Ambulatory Visit (HOSPITAL_COMMUNITY): Payer: 59 | Attending: Cardiology

## 2018-12-16 ENCOUNTER — Other Ambulatory Visit (HOSPITAL_COMMUNITY): Payer: 59

## 2018-12-16 DIAGNOSIS — R0789 Other chest pain: Secondary | ICD-10-CM | POA: Diagnosis not present

## 2018-12-16 DIAGNOSIS — R609 Edema, unspecified: Secondary | ICD-10-CM | POA: Diagnosis not present

## 2019-01-12 NOTE — Progress Notes (Signed)
Office Visit Note  Patient: Robin Maynard             Date of Birth: [birth year]           MRN: [id number]             PCP: Caren Macadam, MD Referring: Caren Macadam, MD Visit Date: 01/26/2019 Occupation: Nurse anesthetist  Subjective:  Fatigue and generalized pain.   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  According to patient her symptoms started about 3 years ago with abdominal pain.  At that time her labs and all the studies were negative.  She states the CT scan initially showed some haziness around the superior mesenteric artery but the follow-up scans were negative.  At that time she developed some generalized pain and fatigue.  She states those symptoms persist.  She also has chronic insomnia now.  She has no difficulty falling asleep but she cannot sustain sleep.  She has been experiencing pain around the trapezius area and upper back.  She also has pain across her shoulders, right epicondyle area and her hands.  She denies any joint swelling.  She states that she gets pedal edema.  She had an echocardiogram which was normal.  She states she has been also suffering from anosmia for 2 to 3 years.  She was seeing the endocrinologist for possible autoimmune thyroiditis.  Her TSH has stayed normal.  Activities of Daily Living:  Patient reports morning stiffness for 15 minutes.   Patient Reports nocturnal pain.  Difficulty dressing/grooming: Denies Difficulty climbing stairs: Denies Difficulty getting out of chair: Denies Difficulty using hands for taps, buttons, cutlery, and/or writing: Denies  Review of Systems  Constitutional: Positive for fatigue. Negative for fever, night sweats, weight gain and weight loss.  HENT: Positive for mouth sores. Negative for trouble swallowing, trouble swallowing, mouth dryness and nose dryness.        Nasal ulcers , on Valtrex  Eyes: Negative for pain,  redness, visual disturbance and dryness.  Respiratory: Negative for cough, shortness of breath and difficulty breathing.   Cardiovascular: Negative for chest pain, palpitations, hypertension, irregular heartbeat and swelling in legs/feet.  Gastrointestinal: Negative for blood in stool, constipation and diarrhea.  Endocrine: Negative for increased urination.  Genitourinary: Negative for vaginal dryness.  Musculoskeletal: Positive for arthralgias, joint pain, myalgias, morning stiffness and myalgias. Negative for joint swelling, muscle weakness and muscle tenderness.  Skin: Negative for color change, rash, hair loss, skin tightness, ulcers and sensitivity to sunlight.  Allergic/Immunologic: Negative for susceptible to infections.  Neurological: Negative for dizziness, memory loss, night sweats and weakness.  Hematological: Negative for swollen glands.  Psychiatric/Behavioral: Positive for sleep disturbance. Negative for depressed mood. The patient is nervous/anxious.     PMFS History:  Patient Active Problem List   Diagnosis Date Noted  . Abdominal pain, epigastric 09/30/2016  . Acute pancreatitis 09/27/2016  . Constipation 09/27/2016  . Overweight (BMI 25.0-29.9) 09/27/2016  . Mass of right lower leg 08/11/2013  . Mass on back 08/11/2013    Past Medical History:  Diagnosis Date  . Acute pancreatitis   . Constipation   . Gastritis   . GERD (gastroesophageal reflux disease)    GI work up in progress  . Mass on back   . Obesity   . PONV (postoperative nausea and vomiting)     Family History  Problem Relation Age of Onset  .  Gastric cancer Father   . Diabetes Father   . Heart disease Father   . Hypertension Father   . Stomach cancer Father   . Deep vein thrombosis Mother   . Colon polyps Mother   . Heart disease Mother   . Diabetes Paternal Grandmother   . Heart disease Paternal Grandmother   . Breast cancer Maternal Aunt   . Aortic stenosis Son   . Colon cancer Neg Hx    . Allergic rhinitis Neg Hx   . Asthma Neg Hx   . Angioedema Neg Hx   . Eczema Neg Hx   . Urticaria Neg Hx   . Rectal cancer Neg Hx   . Esophageal cancer Neg Hx    Past Surgical History:  Procedure Laterality Date  . DILITATION & CURRETTAGE/HYSTROSCOPY WITH HYDROTHERMAL ABLATION N/A 12/26/2016   Procedure: DILATATION & CURETTAGE/HYSTEROSCOPY WITH HYDROTHERMAL ABLATION;  Surgeon: Dian Queen, MD;  Location: [place];  Service: Gynecology;  Laterality: N/A;  . MASS EXCISION Right 02/23/2018   Procedure: EXCISION BACK MASS ERAS PATHWAY;  Surgeon: Rolm Bookbinder, MD;  Location: [place];  Service: General;  Laterality: Right;  . TONSILLECTOMY AND ADENOIDECTOMY    . TYMPANOSTOMY TUBE PLACEMENT     Social History   Social History Narrative  . Not on file    There is no immunization history on file for this patient.   Objective: Vital Signs: BP 112/74 (BP Location: Left Arm, Patient Position: Sitting, Cuff Size: Normal)   Pulse 61   Resp 13   Ht 5' 3.5" (1.613 m)   Wt 180 lb (81.6 kg)   BMI 31.39 kg/m    Physical Exam Vitals signs and nursing note reviewed.  Constitutional:      Appearance: She is well-developed.  HENT:     Head: Normocephalic and atraumatic.  Eyes:     Conjunctiva/sclera: Conjunctivae normal.  Neck:     Musculoskeletal: Normal range of motion.  Cardiovascular:     Rate and Rhythm: Normal rate and regular rhythm.     Heart sounds: Normal heart sounds.  Pulmonary:     Effort: Pulmonary effort is normal.     Breath sounds: Normal breath sounds.  Abdominal:     General: Bowel sounds are normal.     Palpations: Abdomen is soft.  Lymphadenopathy:     Cervical: No cervical adenopathy.  Skin:    General: Skin is warm and dry.     Capillary Refill: Capillary refill takes less than 2 seconds.  Neurological:     Mental Status: She is alert and oriented to person, place, and time.  Psychiatric:        Behavior:  Behavior normal.      Musculoskeletal Exam: C-spine thoracic and lumbar spine good range of motion.  Shoulder joints elbow joints wrist joints with good range of motion.  She has some tenderness over right first and second MCP joint.  DIP and PIP thickening was noted in her hands.  No synovitis was noted.  Hip joints, knee joints, ankles, MTPs and PIPs with good range of motion.  She has some osteoarthritic changes in her bilateral first MTP joints.  CDAI Exam: CDAI Score: - Patient Global: -; Provider Global: - Swollen: -; Tender: - Joint Exam   No joint exam has been documented for this visit   There is currently no information documented on the homunculus. Go to the Rheumatology activity and complete the homunculus joint exam.  Investigation: Findings:  01/09/18: RF 10.6, uric acid 4.3, sed rate 9, dsDNA <1, ANA negative, CRP 14.4, TSH 1.48, lyme-, RMSF-, vitamin D 30, cortisol 4.9, vitamin b12 448   Imaging: Xr Hand 2 View Left  Result Date: 01/26/2019 PIP and DIP narrowing was noted.  No MCP, intercarpal radiocarpal joint space narrowing was noted.  No erosive changes noted. Impression: These findings are consistent with osteoarthritis of the hand.  Xr Hand 2 View Right  Result Date: 01/26/2019 PIP and DIP narrowing was noted.  No intercarpal radiocarpal or MCP joint narrowing was noted.  No erosive changes were noted. Impression: These findings are consistent with osteoarthritis of the hand.   Recent Labs: Lab Results  Component Value Date   WBC 5.5 09/28/2016   HGB 14.7 12/26/2016   PLT 179 09/28/2016   NA 140 09/29/2016   K 4.0 09/29/2016   CL 111 09/29/2016   CO2 23 09/29/2016   GLUCOSE 118 (H) 09/29/2016   BUN 9 09/29/2016   CREATININE 0.73 09/29/2016   BILITOT 0.7 09/29/2016   ALKPHOS 58 09/29/2016   AST 15 09/29/2016   ALT 24 09/29/2016   PROT 6.2 (L) 09/29/2016   ALBUMIN 3.3 (L) 09/29/2016   CALCIUM 8.7 (L) 09/29/2016   GFRAA >60 09/29/2016     Speciality Comments: No specialty comments available.  Procedures:  No procedures performed Allergies: Patient has no known allergies.   Assessment / Plan:     Visit Diagnoses: Pain in both hands -patient complains of pain in bilateral hands.  Clinical findings are consistent with osteoarthritis.  Plan: XR Hand 2 View Right, XR Hand 2 View Left, x-rays were consistent with osteoarthritis.  Her RF and anti-CCP was negative in the past.  I will check 14-3-3 eta Protein,  Chronic fatigue - 01/09/18: RF 10.6, uric acid 4.3, sed rate 9, dsDNA <1, ANA negative, CRP 14.4, TSH 1.48, lyme-, RMSF-, vitamin D 30, cortisol 4.9, vitamin b12 448 -she continues to have chronic fatigue going on for 2 to 3 years.  I believe her fatigue may be related to chronic insomnia.  She also has some generalized discomfort.  I will obtain following labs today.  Plan: CBC with Differential/Platelet, COMPLETE METABOLIC PANEL WITH GFR, CK, Pan-ANCA, Serum protein electrophoresis with reflex,   Other insomnia -patient complains of insomnia for several years.  She has no difficulty falling asleep but she has difficulty sustaining sleep.  Good sleep hygiene was discussed.  Myofascial pain -she is generally pain, positive tender points and fatigue.  Detailed counseling regarding myofascial pain was provided.  Plan: I will refer her to integrative therapies.  Need for regular exercise was discussed.  Treating insomnia will help.  CRP elevated-I do not see any synovitis on examination.  We will see the results of the labs ordered today.  History of gastroesophageal reflux (GERD)   Orders: Orders Placed This Encounter  Procedures  . XR Hand 2 View Right  . XR Hand 2 View Left  . CBC with Differential/Platelet  . COMPLETE METABOLIC PANEL WITH GFR  . CK  . 14-3-3 eta Protein  . Pan-ANCA  . Serum protein electrophoresis with reflex   No orders of the defined types were placed in this encounter.   Face-to-face time spent  with patient was 45 minutes. Greater than 50% of time was spent in counseling and coordination of care.  Follow-Up Instructions: Return for Elevated CRP and fatigue.   Bo Merino, MD  Note - This record has been created using Editor, commissioning.  Chart creation errors have been sought, but may not always  have been located. Such creation errors do not reflect on  the standard of medical care.

## 2019-01-26 ENCOUNTER — Ambulatory Visit: Payer: 59 | Admitting: Rheumatology

## 2019-01-26 ENCOUNTER — Encounter: Payer: Self-pay | Admitting: Rheumatology

## 2019-01-26 ENCOUNTER — Ambulatory Visit: Payer: Self-pay

## 2019-01-26 ENCOUNTER — Other Ambulatory Visit: Payer: Self-pay

## 2019-01-26 ENCOUNTER — Ambulatory Visit (INDEPENDENT_AMBULATORY_CARE_PROVIDER_SITE_OTHER): Payer: 59

## 2019-01-26 VITALS — BP 112/74 | HR 61 | Resp 13 | Ht 63.5 in | Wt 180.0 lb

## 2019-01-26 DIAGNOSIS — M7918 Myalgia, other site: Secondary | ICD-10-CM | POA: Diagnosis not present

## 2019-01-26 DIAGNOSIS — G4709 Other insomnia: Secondary | ICD-10-CM

## 2019-01-26 DIAGNOSIS — M79642 Pain in left hand: Secondary | ICD-10-CM

## 2019-01-26 DIAGNOSIS — R5382 Chronic fatigue, unspecified: Secondary | ICD-10-CM | POA: Diagnosis not present

## 2019-01-26 DIAGNOSIS — Z8719 Personal history of other diseases of the digestive system: Secondary | ICD-10-CM | POA: Diagnosis not present

## 2019-01-26 DIAGNOSIS — M79641 Pain in right hand: Secondary | ICD-10-CM | POA: Diagnosis not present

## 2019-01-26 DIAGNOSIS — R7982 Elevated C-reactive protein (CRP): Secondary | ICD-10-CM | POA: Diagnosis not present

## 2019-01-26 NOTE — Patient Instructions (Signed)
Start taking magnesium malate 250 mg at bedtime to help with muscle spasms and insomnia.

## 2019-01-26 NOTE — Progress Notes (Signed)
Pharmacy Note  Subjective:  Patient presents today to the [facility] to see Dr. Estanislado Pandy.   Patient seen by the pharmacist for counseling on natural anti-inflammatories and magnesium.  Objective: Outpatient Encounter Medications as of 01/26/2019  Medication Sig  . valACYclovir (VALTREX) 500 MG tablet Take 500 mg by mouth daily as needed (breakout).   Marland Kitchen zolpidem (AMBIEN) 10 MG tablet at bedtime as needed.    Facility-Administered Encounter Medications as of 01/26/2019  Medication  . 0.9 %  sodium chloride infusion    Assessment/Plan:  Counseled on the purpose, proper use, and adverse effects of natural anti-inflammatories including upset stomach and increased bleeding risk.  Encouraged patient to add one medication at a time and to include on medication list to monitor for adverse effects and drug interactions.  Given educational handout with recommended doses.  Counseled on the purpose, proper use, and adverse effects of magnesium including bloating, diarrhea, upset stomach, nausea, vomiting.Recommended dose of magnesium malate is 250 mg at bedtime to help with insomnia and muscle spasms.  All questions encouraged and answered.  Instructed patient to call with any further questions or concerns.  Mariella Saa, PharmD, [facility] Rheumatology Clinical Pharmacist  01/26/2019 8:49 AM

## 2019-01-31 LAB — CBC WITH DIFFERENTIAL/PLATELET
Absolute Monocytes: 476 cells/uL (ref 200–950)
Basophils Absolute: 41 cells/uL (ref 0–200)
Basophils Relative: 0.6 %
Eosinophils Absolute: 214 cells/uL (ref 15–500)
Eosinophils Relative: 3.1 %
HCT: 43.9 % (ref 35.0–45.0)
Hemoglobin: 15 g/dL (ref 11.7–15.5)
Lymphs Abs: 1718 cells/uL (ref 850–3900)
MCH: 30.5 pg (ref 27.0–33.0)
MCHC: 34.2 g/dL (ref 32.0–36.0)
MCV: 89.2 fL (ref 80.0–100.0)
MPV: 10.2 fL (ref 7.5–12.5)
Monocytes Relative: 6.9 %
Neutro Abs: 4451 cells/uL (ref 1500–7800)
Neutrophils Relative %: 64.5 %
Platelets: 264 10*3/uL (ref 140–400)
RBC: 4.92 10*6/uL (ref 3.80–5.10)
RDW: 14.1 % (ref 11.0–15.0)
Total Lymphocyte: 24.9 %
WBC: 6.9 10*3/uL (ref 3.8–10.8)

## 2019-01-31 LAB — COMPLETE METABOLIC PANEL WITH GFR
AG Ratio: 1.9 (calc) (ref 1.0–2.5)
ALT: 24 U/L (ref 6–29)
AST: 16 U/L (ref 10–35)
Albumin: 4.6 g/dL (ref 3.6–5.1)
Alkaline phosphatase (APISO): 78 U/L (ref 37–153)
BUN: 24 mg/dL (ref 7–25)
CO2: 26 mmol/L (ref 20–32)
Calcium: 9.8 mg/dL (ref 8.6–10.4)
Chloride: 108 mmol/L (ref 98–110)
Creat: 0.81 mg/dL (ref 0.50–1.05)
GFR, Est African American: 96 mL/min/{1.73_m2} (ref >=60)
GFR, Est Non African American: 83 mL/min/{1.73_m2} (ref >=60)
Globulin: 2.4 g/dL (calc) (ref 1.9–3.7)
Glucose, Bld: 108 mg/dL — ABNORMAL HIGH (ref 65–99)
Potassium: 4.3 mmol/L (ref 3.5–5.3)
Sodium: 139 mmol/L (ref 135–146)
Total Bilirubin: 0.6 mg/dL (ref 0.2–1.2)
Total Protein: 7 g/dL (ref 6.1–8.1)

## 2019-01-31 LAB — PROTEIN ELECTROPHORESIS, SERUM, WITH REFLEX
Albumin ELP: 4.2 g/dL (ref 3.8–4.8)
Alpha 1: 0.3 g/dL (ref 0.2–0.3)
Alpha 2: 0.7 g/dL (ref 0.5–0.9)
Beta 2: 0.3 g/dL (ref 0.2–0.5)
Beta Globulin: 0.4 g/dL (ref 0.4–0.6)
Gamma Globulin: 0.7 g/dL — ABNORMAL LOW (ref 0.8–1.7)
Total Protein: 6.7 g/dL (ref 6.1–8.1)

## 2019-01-31 LAB — 14-3-3 ETA PROTEIN: 14-3-3 eta Protein: 0.7 ng/mL — ABNORMAL HIGH (ref <0.2)

## 2019-01-31 LAB — PAN-ANCA
ANCA Screen: NEGATIVE
Myeloperoxidase Abs: <1 AI
Serine Protease 3: <1 AI

## 2019-01-31 LAB — CK: Total CK: 133 U/L (ref 29–143)

## 2019-02-01 NOTE — Progress Notes (Signed)
We will discuss labs at the fu visit.

## 2019-02-14 NOTE — Progress Notes (Signed)
Office Visit Note  Patient: Robin Maynard             Date of Birth: 21-Sep-1965           MRN: [id number]             PCP: Caren Macadam, MD Referring: Glenford Bayley, DO Visit Date: 02/17/2019 Occupation: @GUAROCC @  Subjective:  Pain in both hands.   History of Present Illness: Robin Maynard is a 53 y.o. female with history of polyarthralgia.  She states she continues to have pain and discomfort in her bilateral hands, between the shoulder blades and right elbow.  She denies any joint swelling. She has started  taking magnesium which is helped with some myalgias.  Activities of Daily Living:  Patient reports morning stiffness for 10-15 minutes.   Patient Reports nocturnal pain.  Difficulty dressing/grooming: Denies Difficulty climbing stairs: Denies Difficulty getting out of chair: Denies Difficulty using hands for taps, buttons, cutlery, and/or writing: Denies  Review of Systems  Constitutional: Positive for fatigue. Negative for night sweats, weight gain and weight loss.  HENT: Negative for mouth sores, trouble swallowing, trouble swallowing, mouth dryness and nose dryness.   Eyes: Positive for dryness. Negative for pain, redness, itching and visual disturbance.  Respiratory: Negative for cough, shortness of breath, wheezing and difficulty breathing.   Cardiovascular: Negative for chest pain, palpitations, hypertension, irregular heartbeat and swelling in legs/feet.  Gastrointestinal: Negative for blood in stool, constipation and diarrhea.  Endocrine: Negative for increased urination.  Genitourinary: Negative for painful urination, pelvic pain and vaginal dryness.  Musculoskeletal: Positive for arthralgias, joint pain and morning stiffness. Negative for joint swelling, myalgias, muscle weakness, muscle tenderness and myalgias.  Skin: Positive for rash and redness. Negative for color change, hair loss, skin tightness, ulcers and sensitivity to sunlight.   Allergic/Immunologic: Negative for susceptible to infections.  Neurological: Negative for dizziness, headaches, memory loss, night sweats and weakness.  Hematological: Negative for bruising/bleeding tendency and swollen glands.  Psychiatric/Behavioral: Negative for depressed mood, confusion and sleep disturbance. The patient is not nervous/anxious.     PMFS History:  Patient Active Problem List   Diagnosis Date Noted  . Abdominal pain, epigastric 09/30/2016  . Acute pancreatitis 09/27/2016  . Constipation 09/27/2016  . Overweight (BMI 25.0-29.9) 09/27/2016  . Mass of right lower leg 08/11/2013  . Mass on back 08/11/2013    Past Medical History:  Diagnosis Date  . Acute pancreatitis   . Constipation   . Gastritis   . GERD (gastroesophageal reflux disease)    GI work up in progress  . Mass on back   . Obesity   . PONV (postoperative nausea and vomiting)     Family History  Problem Relation Age of Onset  . Gastric cancer Father   . Diabetes Father   . Heart disease Father   . Hypertension Father   . Stomach cancer Father   . Deep vein thrombosis Mother   . Colon polyps Mother   . Heart disease Mother   . Diabetes Paternal Grandmother   . Heart disease Paternal Grandmother   . Breast cancer Maternal Aunt   . Aortic stenosis Son   . Colon cancer Neg Hx   . Allergic rhinitis Neg Hx   . Asthma Neg Hx   . Angioedema Neg Hx   . Eczema Neg Hx   . Urticaria Neg Hx   . Rectal cancer Neg Hx   . Esophageal cancer Neg Hx    Past  Surgical History:  Procedure Laterality Date  . DILITATION & CURRETTAGE/HYSTROSCOPY WITH HYDROTHERMAL ABLATION N/A 12/26/2016   Procedure: DILATATION & CURETTAGE/HYSTEROSCOPY WITH HYDROTHERMAL ABLATION;  Surgeon: Dian Queen, MD;  Location: [place];  Service: Gynecology;  Laterality: N/A;  . MASS EXCISION Right 02/23/2018   Procedure: EXCISION BACK MASS ERAS PATHWAY;  Surgeon: Rolm Bookbinder, MD;  Location: [place];  Service: General;  Laterality: Right;  . TONSILLECTOMY AND ADENOIDECTOMY    . TYMPANOSTOMY TUBE PLACEMENT     Social History   Social History Narrative  . Not on file    There is no immunization history on file for this patient.   Objective: Vital Signs: BP 120/77 (BP Location: Right Arm, Patient Position: Sitting, Cuff Size: Normal)   Pulse 79   Resp 13   Ht 5\' 3"  (1.6 m)   Wt 180 lb (81.6 kg)   BMI 31.89 kg/m    Physical Exam Vitals signs and nursing note reviewed.  Constitutional:      Appearance: She is well-developed.  HENT:     Head: Normocephalic and atraumatic.  Eyes:     Conjunctiva/sclera: Conjunctivae normal.  Neck:     Musculoskeletal: Normal range of motion.  Cardiovascular:     Rate and Rhythm: Normal rate and regular rhythm.     Heart sounds: Normal heart sounds.  Pulmonary:     Effort: Pulmonary effort is normal.     Breath sounds: Normal breath sounds.  Abdominal:     General: Bowel sounds are normal.     Palpations: Abdomen is soft.  Lymphadenopathy:     Cervical: No cervical adenopathy.  Skin:    General: Skin is warm and dry.     Capillary Refill: Capillary refill takes less than 2 seconds.  Neurological:     Mental Status: She is alert and oriented to person, place, and time.  Psychiatric:        Behavior: Behavior normal.      Musculoskeletal Exam: C-spine thoracic and lumbar spine were in good range of motion.  Shoulder joints elbow joints wrist joints with good range of motion.  She has no synovitis over MCP joints.  PIP and DIP thickening was noted.  Hip joints knee joints ankles MTPs PIPs did not show any synovitis.  She continues to have some muscle tightness in the trapezius and intrascapular area.  CDAI Exam: CDAI Score: - Patient Global: -; Provider Global: - Swollen: -; Tender: - Joint Exam   No joint exam has been documented for this visit   There is currently no information documented on the homunculus. Go to the  Rheumatology activity and complete the homunculus joint exam.  Investigation: No additional findings.  Imaging: Xr Hand 2 View Left  Result Date: 01/26/2019 PIP and DIP narrowing was noted.  No MCP, intercarpal radiocarpal joint space narrowing was noted.  No erosive changes noted. Impression: These findings are consistent with osteoarthritis of the hand.  Xr Hand 2 View Right  Result Date: 01/26/2019 PIP and DIP narrowing was noted.  No intercarpal radiocarpal or MCP joint narrowing was noted.  No erosive changes were noted. Impression: These findings are consistent with osteoarthritis of the hand.   Recent Labs: Lab Results  Component Value Date   WBC 6.9 01/26/2019   HGB 15.0 01/26/2019   PLT 264 01/26/2019   NA 139 01/26/2019   K 4.3 01/26/2019   CL 108 01/26/2019   CO2 26 01/26/2019   GLUCOSE 108 (H)  01/26/2019   BUN 24 01/26/2019   CREATININE 0.81 01/26/2019   BILITOT 0.6 01/26/2019   ALKPHOS 58 09/29/2016   AST 16 01/26/2019   ALT 24 01/26/2019   PROT 7.0 01/26/2019   PROT 6.7 01/26/2019   ALBUMIN 3.3 (L) 09/29/2016   CALCIUM 9.8 01/26/2019   GFRAA 96 01/26/2019  January 26, 2019 SPEP showed hypogammaglobulinemia, ANCA negative, CK 133, 14 3 3  eta positive at 0.7  01/09/18: RF 10.6, uric acid 4.3, sed rate 9, dsDNA <1, ANA negative, CRP 14.4, TSH 1.48, lyme-, RMSF-, vitamin D 30, cortisol 4.9, vitamin b12 448  Speciality Comments: No specialty comments available.  Procedures:  No procedures performed Allergies: Patient has no known allergies.   Assessment / Plan:     Visit Diagnoses: Primary osteoarthritis of both hands -radiographic findings are consistent with osteoarthritis.  Joint protection muscle strengthening was discussed.  Use of Voltaren gel and natural anti-inflammatories were discussed.  Pain in both hands - No synovitis noted on examination.  Most recent labs showed +14 3 3  eta.  Her autoimmune work-up in the past has been negative.  She has been  having increased discomfort I will obtain some additional labs today.  I will contact her with lab work if it is positive.  I have also informed her of if she has persistent symptoms or increased joint swelling then we may consider ultrasound of her bilateral hands.- Plan: Rheumatoid factor, Cyclic citrul peptide antibody, IgG, ANA, Sedimentation rate  Chronic fatigue - Plan: Glucose 6 phosphate dehydrogenase,   Myofascial pain - Patient was referred to integrative therapies at the last visit.  Other insomnia - Plan: Good sleep hygiene was discussed.  History of gastroesophageal reflux (GERD)  Orders: Orders Placed This Encounter  Procedures  . Rheumatoid factor  . Cyclic citrul peptide antibody, IgG  . ANA  . Sedimentation rate  . Glucose 6 phosphate dehydrogenase   No orders of the defined types were placed in this encounter.     Follow-Up Instructions: Return in about 6 months (around 08/20/2019) for Osteoarthritis.   Bo Merino, MD  Note - This record has been created using Editor, commissioning.  Chart creation errors have been sought, but may not always  have been located. Such creation errors do not reflect on  the standard of medical care.

## 2019-02-17 ENCOUNTER — Ambulatory Visit: Payer: 59 | Admitting: Rheumatology

## 2019-02-17 ENCOUNTER — Encounter: Payer: Self-pay | Admitting: Rheumatology

## 2019-02-17 ENCOUNTER — Other Ambulatory Visit: Payer: Self-pay

## 2019-02-17 VITALS — BP 120/77 | HR 79 | Resp 13 | Ht 63.0 in | Wt 180.0 lb

## 2019-02-17 DIAGNOSIS — Z8719 Personal history of other diseases of the digestive system: Secondary | ICD-10-CM | POA: Diagnosis not present

## 2019-02-17 DIAGNOSIS — M7918 Myalgia, other site: Secondary | ICD-10-CM

## 2019-02-17 DIAGNOSIS — R5382 Chronic fatigue, unspecified: Secondary | ICD-10-CM | POA: Diagnosis not present

## 2019-02-17 DIAGNOSIS — M79642 Pain in left hand: Secondary | ICD-10-CM | POA: Diagnosis not present

## 2019-02-17 DIAGNOSIS — M19041 Primary osteoarthritis, right hand: Secondary | ICD-10-CM

## 2019-02-17 DIAGNOSIS — M19042 Primary osteoarthritis, left hand: Secondary | ICD-10-CM | POA: Diagnosis not present

## 2019-02-17 DIAGNOSIS — G4709 Other insomnia: Secondary | ICD-10-CM | POA: Diagnosis not present

## 2019-02-17 DIAGNOSIS — M79641 Pain in right hand: Secondary | ICD-10-CM | POA: Diagnosis not present

## 2019-02-17 NOTE — Progress Notes (Signed)
Pharmacy Note  Subjective:  Patient presents today to the [facility] to see Dr. Estanislado Pandy.   Patient seen by the pharmacist for counseling on natural anti-inflammatories and Voltaren gel.  Objective: Current Outpatient Medications on File Prior to Visit  Medication Sig Dispense Refill  . valACYclovir (VALTREX) 500 MG tablet Take 500 mg by mouth daily as needed (breakout).     Marland Kitchen zolpidem (AMBIEN) 10 MG tablet at bedtime as needed.      Current Facility-Administered Medications on File Prior to Visit  Medication Dose Route Frequency Provider Last Rate Last Dose  . 0.9 %  sodium chloride infusion  500 mL Intravenous Once Milus Banister, MD         Assessment/Plan:  Counseled on the purpose, proper use, and adverse effects of natural anti-inflammatories including upset stomach and increased bleeding risk.  Encouraged patient to add one medication at a time and to include on medication list to monitor for adverse effects and drug interactions.  Given educational handout with recommended doses.   Patient on the purpose, proper use, and adverse effects of Voltaren gel including headache, increased blood pressure, and risk of GI bleed.  Instructed patient to avoid applying to open skin wound, or on areas of infection, rash, burn, or peeling skin.  Advised  patient wait at least 10 minutes before dressing or wearing gloves and wait at least 1 hour before you bathe or shower.  Counseled patient to wash hands after application and avoid contact with face/eyes.  Advised patient to apply with q-tip if applying to hands to minimize absorption on palms.  Patient given GoodRx coupon to help with cost as it is not routinely covered by insurance.  It is now available over the counter.  All questions encouraged and answered.  Instructed patient to call with any further questions or concerns.  Mariella Saa, PharmD, [facility] Rheumatology Clinical Pharmacist  02/17/2019 2:51 PM

## 2019-02-18 LAB — RHEUMATOID FACTOR: Rhuematoid fact SerPl-aCnc: <14 IU/mL (ref <14)

## 2019-02-18 LAB — ANA: Anti Nuclear Antibody (ANA): NEGATIVE

## 2019-02-18 LAB — CYCLIC CITRUL PEPTIDE ANTIBODY, IGG: Cyclic Citrullin Peptide Ab: <16 UNITS

## 2019-02-18 LAB — SEDIMENTATION RATE: Sed Rate: 9 mm/h (ref 0–30)

## 2019-02-18 LAB — GLUCOSE 6 PHOSPHATE DEHYDROGENASE: G-6PDH: 14.5 U/g Hgb (ref 7.0–20.5)

## 2019-02-21 NOTE — Progress Notes (Signed)
Please notify patient that all the labs are normal.

## 2019-03-11 DIAGNOSIS — H52223 Regular astigmatism, bilateral: Secondary | ICD-10-CM | POA: Diagnosis not present

## 2019-03-11 DIAGNOSIS — H5213 Myopia, bilateral: Secondary | ICD-10-CM | POA: Diagnosis not present

## 2019-03-11 DIAGNOSIS — H524 Presbyopia: Secondary | ICD-10-CM | POA: Diagnosis not present

## 2019-03-14 ENCOUNTER — Other Ambulatory Visit: Payer: 59

## 2019-03-21 ENCOUNTER — Ambulatory Visit: Payer: 59

## 2019-03-21 ENCOUNTER — Other Ambulatory Visit: Payer: Self-pay | Admitting: Obstetrics and Gynecology

## 2019-03-21 ENCOUNTER — Ambulatory Visit
Admission: RE | Admit: 2019-03-21 | Discharge: 2019-03-21 | Disposition: A | Discharge status: Home / Self Care | Payer: 59 | Source: Ambulatory Visit | Attending: Obstetrics and Gynecology | Admitting: Obstetrics and Gynecology

## 2019-03-21 ENCOUNTER — Other Ambulatory Visit: Payer: Self-pay

## 2019-03-21 DIAGNOSIS — N6489 Other specified disorders of breast: Secondary | ICD-10-CM

## 2019-03-21 DIAGNOSIS — R928 Other abnormal and inconclusive findings on diagnostic imaging of breast: Secondary | ICD-10-CM | POA: Diagnosis not present

## 2019-04-30 ENCOUNTER — Other Ambulatory Visit: Payer: Self-pay | Admitting: Orthopedic Surgery

## 2019-04-30 ENCOUNTER — Other Ambulatory Visit: Payer: Self-pay

## 2019-04-30 ENCOUNTER — Ambulatory Visit (HOSPITAL_COMMUNITY)
Admission: RE | Admit: 2019-04-30 | Discharge: 2019-04-30 | Disposition: A | Discharge status: Home / Self Care | Payer: 59 | Source: Ambulatory Visit | Attending: Orthopedic Surgery | Admitting: Orthopedic Surgery

## 2019-04-30 DIAGNOSIS — S42001A Fracture of unspecified part of right clavicle, initial encounter for closed fracture: Secondary | ICD-10-CM | POA: Insufficient documentation

## 2019-04-30 DIAGNOSIS — S42017A Nondisplaced fracture of sternal end of right clavicle, initial encounter for closed fracture: Secondary | ICD-10-CM

## 2019-05-10 DIAGNOSIS — M25511 Pain in right shoulder: Secondary | ICD-10-CM | POA: Diagnosis not present

## 2019-05-10 DIAGNOSIS — R293 Abnormal posture: Secondary | ICD-10-CM | POA: Diagnosis not present

## 2019-05-10 DIAGNOSIS — M25512 Pain in left shoulder: Secondary | ICD-10-CM | POA: Diagnosis not present

## 2019-05-12 DIAGNOSIS — M25511 Pain in right shoulder: Secondary | ICD-10-CM | POA: Diagnosis not present

## 2019-06-10 MED FILL — OMRON 3 SERIES BP MONITOR D: 90 days supply | Qty: 1 | Fill #0

## 2019-06-16 ENCOUNTER — Encounter: Payer: Self-pay | Admitting: Rheumatology

## 2019-06-16 NOTE — Progress Notes (Signed)
Office Visit Note  Patient: Robin Maynard             Date of Birth: 1966-06-16           MRN: [id number]             PCP: Caren Macadam, MD Referring: Caren Macadam, MD Visit Date: 06/21/2019 Occupation: @GUAROCC @  Subjective:  Pain in both shoulder joints   History of Present Illness: Robin Maynard is a 54 y.o. female with history of osteoarthritis and myofascial pain syndrome. She continues to have pain in both shoulder joints and C-spine.  She states physical therapy has helped significantly. 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  She states recently has was injured by one of the horses and fractured her right clavicle.  She was evaluated by Dr. Veverly Fells.  She continues to have chronic fatigue related to insomnia.  She states she experiences extreme exhaustion at least once a month.  She takes Ambien 10 mg po at bedtime for insomnia. She would like to proceed with applying for FMLA.    Activities of Daily Living:  Patient reports morning stiffness for 10-15   minutes.   Patient Reports nocturnal pain.  Difficulty dressing/grooming: Denies Difficulty climbing stairs: Denies Difficulty getting out of chair: Denies Difficulty using hands for taps, buttons, cutlery, and/or writing: Denies  Review of Systems  Constitutional: Positive for fatigue.  HENT: Negative for mouth sores, mouth dryness and nose dryness.   Eyes: Positive for dryness. Negative for pain and visual disturbance.  Respiratory: Negative for cough, hemoptysis, shortness of breath and difficulty breathing.   Cardiovascular: Negative for chest pain, palpitations, hypertension and swelling in legs/feet.  Gastrointestinal: Negative for blood in stool, constipation and diarrhea.  Endocrine: Negative for increased urination.  Genitourinary: Negative for painful urination.  Musculoskeletal: Positive for arthralgias, joint pain and muscle tenderness. Negative for joint  swelling, myalgias, muscle weakness, morning stiffness and myalgias.  Skin: Positive for rash. Negative for color change, pallor, hair loss, nodules/bumps, skin tightness, ulcers and sensitivity to sunlight.  Allergic/Immunologic: Negative for susceptible to infections.  Neurological: Negative for dizziness, numbness, headaches and weakness.  Hematological: Negative for swollen glands.  Psychiatric/Behavioral: Positive for sleep disturbance. Negative for depressed mood. The patient is not nervous/anxious.     PMFS History:  Patient Active Problem List   Diagnosis Date Noted  . Closed fracture of right clavicle 04/30/2019  . Abdominal pain, epigastric 09/30/2016  . Acute pancreatitis 09/27/2016  . Constipation 09/27/2016  . Overweight (BMI 25.0-29.9) 09/27/2016  . Mass of right lower leg 08/11/2013  . Mass on back 08/11/2013    Past Medical History:  Diagnosis Date  . Acute pancreatitis   . Constipation   . Gastritis   . GERD (gastroesophageal reflux disease)    GI work up in progress  . Mass on back   . Obesity   . PONV (postoperative nausea and vomiting)     Family History  Problem Relation Age of Onset  . Gastric cancer Father   . Diabetes Father   . Heart disease Father   . Hypertension Father   . Stomach cancer Father   . Deep vein thrombosis Mother   . Colon polyps Mother   . Heart disease Mother   . Diabetes Paternal Grandmother   . Heart disease Paternal Grandmother   . Breast cancer Maternal Aunt   . Aortic stenosis Son   . Colon cancer Neg Hx   .  Allergic rhinitis Neg Hx   . Asthma Neg Hx   . Angioedema Neg Hx   . Eczema Neg Hx   . Urticaria Neg Hx   . Rectal cancer Neg Hx   . Esophageal cancer Neg Hx    Past Surgical History:  Procedure Laterality Date  . DILITATION & CURRETTAGE/HYSTROSCOPY WITH HYDROTHERMAL ABLATION N/A 12/26/2016   Procedure: DILATATION & CURETTAGE/HYSTEROSCOPY WITH HYDROTHERMAL ABLATION;  Surgeon: Dian Queen, MD;  Location:  [facility];  Service: Gynecology;  Laterality: N/A;  . MASS EXCISION Right 02/23/2018   Procedure: EXCISION BACK MASS ERAS PATHWAY;  Surgeon: Rolm Bookbinder, MD;  Location: [facility];  Service: General;  Laterality: Right;  . TONSILLECTOMY AND ADENOIDECTOMY    . TYMPANOSTOMY TUBE PLACEMENT     Social History   Social History Narrative  . Not on file    There is no immunization history on file for this patient.   Objective: Vital Signs: BP 109/74 (BP Location: Left Arm, Patient Position: Sitting, Cuff Size: Normal)   Pulse 75   Resp 12   Ht 5\' 4"  (1.626 m)   Wt 183 lb (83 kg)   BMI 31.41 kg/m    Physical Exam Vitals signs and nursing note reviewed.  Constitutional:      Appearance: She is well-developed.  HENT:     Head: Normocephalic and atraumatic.  Eyes:     Conjunctiva/sclera: Conjunctivae normal.  Neck:     Musculoskeletal: Normal range of motion.  Cardiovascular:     Rate and Rhythm: Normal rate and regular rhythm.     Heart sounds: Normal heart sounds.  Pulmonary:     Effort: Pulmonary effort is normal.     Breath sounds: Normal breath sounds.  Abdominal:     General: Bowel sounds are normal.     Palpations: Abdomen is soft.  Lymphadenopathy:     Cervical: No cervical adenopathy.  Skin:    General: Skin is warm and dry.     Capillary Refill: Capillary refill takes less than 2 seconds.  Neurological:     Mental Status: She is alert and oriented to person, place, and time.  Psychiatric:        Behavior: Behavior normal.      Musculoskeletal Exam: C-spine good ROM.  Full of ROM of both shoulder joints with discomfort bilaterally.  Elbow joints, wrist joints, MCPs, PIPs, and DIPs good ROM with no synovitis.  PIP and DIP synovial thickening consistent with osteoarthritis of both hands.  Hip joints, knee joints, ankle joints, MTPs, PIPs, and DIPs good ROM with no synovitis.  No warmth or effusion of knee joints noted.  No  tenderness or swelling of ankle joints.   CDAI Exam: CDAI Score: - Patient Global: -; Provider Global: - Swollen: -; Tender: - Joint Exam   No joint exam has been documented for this visit   There is currently no information documented on the homunculus. Go to the Rheumatology activity and complete the homunculus joint exam.  Investigation: No additional findings.  Imaging: No results found.  Recent Labs: Lab Results  Component Value Date   WBC 6.9 01/26/2019   HGB 15.0 01/26/2019   PLT 264 01/26/2019   NA 139 01/26/2019   K 4.3 01/26/2019   CL 108 01/26/2019   CO2 26 01/26/2019   GLUCOSE 108 (H) 01/26/2019   BUN 24 01/26/2019   CREATININE 0.81 01/26/2019   BILITOT 0.6 01/26/2019   ALKPHOS 58 09/29/2016   AST 16  01/26/2019   ALT 24 01/26/2019   PROT 7.0 01/26/2019   PROT 6.7 01/26/2019   ALBUMIN 3.3 (L) 09/29/2016   CALCIUM 9.8 01/26/2019   GFRAA 96 01/26/2019    Speciality Comments: No specialty comments available.  Procedures:  No procedures performed Allergies: Doxycycline   Assessment / Plan:     Visit Diagnoses: Primary osteoarthritis of both hands: She has PIP and DIP synovial thickening consistent with osteoarthritis of both hands. No tenderness or synovitis was noted. She has complete fist formation bilaterally. She has no difficulties with ADLs. Joint protection and muscle strengthening were discussed.  Chronic fatigue: She has chronic fatigue related to insomnia. She experiences extreme fatigue at least 1 day/month which makes it difficult to perform her ADLs. She continues to have insomnia and takes Ambien 10 mg by mouth at bedtime. She was encouraged to stay active and exercise on a regular basis.  Chronic left shoulder pain -She presents today with ongoing left shoulder joint pain. She has good range of motion on exam with discomfort. X-rays of the left shoulder were obtained today. She was given a handout of shoulder exercises to perform. She  recently went to physical therapy which provided significant relief. Plan: XR  Myofascial pain: She has generalized muscle aches muscle tenderness due to myofascial pain syndrome. She has generalized hyperalgesia on exam. She has been having increased pain in bilateral shoulders and her C-spine. She has good range of motion bilateral shoulder joints on exam. X-rays of left shoulder were obtained today. She was previously going to physical therapy which provided significant pain relief. 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 She reports that recently she was injured by one of her horses and fractured her right clavicle. She was evaluated by Dr. Alma Friendly at that time. We discussed the importance of regular exercise and good sleep hygiene. She continues to have chronic fatigue related to insomnia. She takes Ambien 10 mg by mouth at bedtime. We discussed adding on Cymbalta 30 mg 1 capsule by mouth daily to her current regimen. Potential side effects were discussed. A prescription for Cymbalta will be sent to the pharmacy today. She will also be applying for FMLA.  Other insomnia - She takes ambien 10 mg by mouth at bedtime.  History of gastroesophageal reflux (GERD)   Shoulder Left  Orders: Orders Placed This Encounter  Procedures  . XR Shoulder Left   Meds ordered this encounter  Medications  . DULoxetine (CYMBALTA) 30 MG capsule    Sig: Take 1 capsule (30 mg total) by mouth daily.    Dispense:  30 capsule    Refill:  1    Face-to-face time spent with patient was 30 minutes. Greater than 50% of time was spent in counseling and coordination of care.  Follow-Up Instructions: Return in about 6 months (around 12/19/2019) for Osteoarthritis.   Ofilia Neas, PA-C   I examined and evaluated the patient with Hazel Sams PA.  We had detailed discussion with the patient.  She states she has episodes of extreme fatigue due to myofascial pain that she cannot get to work.  She  is also confused when she is very tired.  She is requesting 1 day of month for flares.  We also discussed adding Cymbalta to see if it will help her with her generalized pain and fatigue.  She was in agreement.  Side effects were discussed at length.  She was placed on Cymbalta 30 mg p.o. daily.  She was experiencing discomfort in her left shoulder joint.  The x-ray was unremarkable.  Handout on shoulder joint exercises was given.  I advised her to follow-up with Dr. Alma Friendly if her symptoms persist.  The plan of care was discussed as noted above.  Bo Merino, MD  Note - This record has been created using Editor, commissioning.  Chart creation errors have been sought, but may not always  have been located. Such creation errors do not reflect on  the standard of medical care.

## 2019-06-20 DIAGNOSIS — Z6831 Body mass index (BMI) 31.0-31.9, adult: Secondary | ICD-10-CM | POA: Diagnosis not present

## 2019-06-20 DIAGNOSIS — F909 Attention-deficit hyperactivity disorder, unspecified type: Secondary | ICD-10-CM | POA: Diagnosis not present

## 2019-06-20 DIAGNOSIS — Z01419 Encounter for gynecological examination (general) (routine) without abnormal findings: Secondary | ICD-10-CM | POA: Diagnosis not present

## 2019-06-20 MED FILL — ZOLPIDEM TARTRATE 10 MG TAB: 10 | 30 days supply | Qty: 30 | Fill #0

## 2019-06-20 MED FILL — AMPHETAMINE-DEXTROAMPHETAMI: 10 | 30 days supply | Qty: 30 | Fill #0

## 2019-06-21 ENCOUNTER — Other Ambulatory Visit: Payer: Self-pay

## 2019-06-21 ENCOUNTER — Ambulatory Visit (INDEPENDENT_AMBULATORY_CARE_PROVIDER_SITE_OTHER): Payer: 59

## 2019-06-21 ENCOUNTER — Encounter: Payer: Self-pay | Admitting: Physician Assistant

## 2019-06-21 ENCOUNTER — Ambulatory Visit (INDEPENDENT_AMBULATORY_CARE_PROVIDER_SITE_OTHER): Payer: 59 | Admitting: Rheumatology

## 2019-06-21 VITALS — BP 109/74 | HR 75 | Resp 12 | Ht 64.0 in | Wt 183.0 lb

## 2019-06-21 DIAGNOSIS — G8929 Other chronic pain: Secondary | ICD-10-CM

## 2019-06-21 DIAGNOSIS — M19041 Primary osteoarthritis, right hand: Secondary | ICD-10-CM | POA: Diagnosis not present

## 2019-06-21 DIAGNOSIS — M25512 Pain in left shoulder: Secondary | ICD-10-CM

## 2019-06-21 DIAGNOSIS — Z8719 Personal history of other diseases of the digestive system: Secondary | ICD-10-CM

## 2019-06-21 DIAGNOSIS — M19042 Primary osteoarthritis, left hand: Secondary | ICD-10-CM

## 2019-06-21 DIAGNOSIS — G4709 Other insomnia: Secondary | ICD-10-CM | POA: Diagnosis not present

## 2019-06-21 DIAGNOSIS — R5382 Chronic fatigue, unspecified: Secondary | ICD-10-CM | POA: Diagnosis not present

## 2019-06-21 DIAGNOSIS — M25511 Pain in right shoulder: Secondary | ICD-10-CM

## 2019-06-21 DIAGNOSIS — M7918 Myalgia, other site: Secondary | ICD-10-CM

## 2019-06-21 MED ORDER — DULOXETINE HCL 30 MG PO CPEP: 30.0000 mg | ORAL_CAPSULE | Freq: Every day | ORAL | 1 refills | Status: DC

## 2019-06-21 MED FILL — DULoxetine HCL 30 MG CPEP: 30 | 30 days supply | Qty: 30 | Fill #0

## 2019-06-21 NOTE — Patient Instructions (Signed)
Shoulder Exercises Ask your health care provider which exercises are safe for you. Do exercises exactly as told by your health care provider and adjust them as directed. It is normal to feel mild stretching, pulling, tightness, or discomfort as you do these exercises. Stop right away if you feel sudden pain or your pain gets worse. Do not begin these exercises until told by your health care provider. Stretching exercises External rotation and abduction This exercise is sometimes called corner stretch. This exercise rotates your arm outward (external rotation) and moves your arm out from your body (abduction). 1. Stand in a doorway with one of your feet slightly in front of the other. This is called a staggered stance. If you cannot reach your forearms to the door frame, stand facing a corner of a room. 2. Choose one of the following positions as told by your health care provider: ? Place your hands and forearms on the door frame above your head. ? Place your hands and forearms on the door frame at the height of your head. ? Place your hands on the door frame at the height of your elbows. 3. Slowly move your weight onto your front foot until you feel a stretch across your chest and in the front of your shoulders. Keep your head and chest upright and keep your abdominal muscles tight. 4. Hold for __________ seconds. 5. To release the stretch, shift your weight to your back foot. Repeat __________ times. Complete this exercise __________ times a day. Extension, standing 1. Stand and hold a broomstick, a cane, or a similar object behind your back. ? Your hands should be a little wider than shoulder width apart. ? Your palms should face away from your back. 2. Keeping your elbows straight and your shoulder muscles relaxed, move the stick away from your body until you feel a stretch in your shoulders (extension). ? Avoid shrugging your shoulders while you move the stick. Keep your shoulder blades tucked  down toward the middle of your back. 3. Hold for __________ seconds. 4. Slowly return to the starting position. Repeat __________ times. Complete this exercise __________ times a day. Range-of-motion exercises Pendulum  1. Stand near a wall or a surface that you can hold onto for balance. 2. Bend at the waist and let your left / right arm hang straight down. Use your other arm to support you. Keep your back straight and do not lock your knees. 3. Relax your left / right arm and shoulder muscles, and move your hips and your trunk so your left / right arm swings freely. Your arm should swing because of the motion of your body, not because you are using your arm or shoulder muscles. 4. Keep moving your hips and trunk so your arm swings in the following directions, as told by your health care provider: ? Side to side. ? Forward and backward. ? In clockwise and counterclockwise circles. 5. Continue each motion for __________ seconds, or for as long as told by your health care provider. 6. Slowly return to the starting position. Repeat __________ times. Complete this exercise __________ times a day. Shoulder flexion, standing  1. Stand and hold a broomstick, a cane, or a similar object. Place your hands a little more than shoulder width apart on the object. Your left / right hand should be palm up, and your other hand should be palm down. 2. Keep your elbow straight and your shoulder muscles relaxed. Push the stick up with your healthy arm to   raise your left / right arm in front of your body, and then over your head until you feel a stretch in your shoulder (flexion). ? Avoid shrugging your shoulder while you raise your arm. Keep your shoulder blade tucked down toward the middle of your back. 3. Hold for __________ seconds. 4. Slowly return to the starting position. Repeat __________ times. Complete this exercise __________ times a day. Shoulder abduction, standing 1. Stand and hold a broomstick,  a cane, or a similar object. Place your hands a little more than shoulder width apart on the object. Your left / right hand should be palm up, and your other hand should be palm down. 2. Keep your elbow straight and your shoulder muscles relaxed. Push the object across your body toward your left / right side. Raise your left / right arm to the side of your body (abduction) until you feel a stretch in your shoulder. ? Do not raise your arm above shoulder height unless your health care provider tells you to do that. ? If directed, raise your arm over your head. ? Avoid shrugging your shoulder while you raise your arm. Keep your shoulder blade tucked down toward the middle of your back. 3. Hold for __________ seconds. 4. Slowly return to the starting position. Repeat __________ times. Complete this exercise __________ times a day. Internal rotation  1. Place your left / right hand behind your back, palm up. 2. Use your other hand to dangle an exercise band, a towel, or a similar object over your shoulder. Grasp the band with your left / right hand so you are holding on to both ends. 3. Gently pull up on the band until you feel a stretch in the front of your left / right shoulder. The movement of your arm toward the center of your body is called internal rotation. ? Avoid shrugging your shoulder while you raise your arm. Keep your shoulder blade tucked down toward the middle of your back. 4. Hold for __________ seconds. 5. Release the stretch by letting go of the band and lowering your hands. Repeat __________ times. Complete this exercise __________ times a day. Strengthening exercises External rotation  1. Sit in a stable chair without armrests. 2. Secure an exercise band to a stable object at elbow height on your left / right side. 3. Place a soft object, such as a folded towel or a small pillow, between your left / right upper arm and your body to move your elbow about 4 inches (10 cm) away  from your side. 4. Hold the end of the exercise band so it is tight and there is no slack. 5. Keeping your elbow pressed against the soft object, slowly move your forearm out, away from your abdomen (external rotation). Keep your body steady so only your forearm moves. 6. Hold for __________ seconds. 7. Slowly return to the starting position. Repeat __________ times. Complete this exercise __________ times a day. Shoulder abduction  1. Sit in a stable chair without armrests, or stand up. 2. Hold a __________ weight in your left / right hand, or hold an exercise band with both hands. 3. Start with your arms straight down and your left / right palm facing in, toward your body. 4. Slowly lift your left / right hand out to your side (abduction). Do not lift your hand above shoulder height unless your health care provider tells you that this is safe. ? Keep your arms straight. ? Avoid shrugging your shoulder while you   do this movement. Keep your shoulder blade tucked down toward the middle of your back. 5. Hold for __________ seconds. 6. Slowly lower your arm, and return to the starting position. Repeat __________ times. Complete this exercise __________ times a day. Shoulder extension 1. Sit in a stable chair without armrests, or stand up. 2. Secure an exercise band to a stable object in front of you so it is at shoulder height. 3. Hold one end of the exercise band in each hand. Your palms should face each other. 4. Straighten your elbows and lift your hands up to shoulder height. 5. Step back, away from the secured end of the exercise band, until the band is tight and there is no slack. 6. Squeeze your shoulder blades together as you pull your hands down to the sides of your thighs (extension). Stop when your hands are straight down by your sides. Do not let your hands go behind your body. 7. Hold for __________ seconds. 8. Slowly return to the starting position. Repeat __________ times.  Complete this exercise __________ times a day. Shoulder row 1. Sit in a stable chair without armrests, or stand up. 2. Secure an exercise band to a stable object in front of you so it is at waist height. 3. Hold one end of the exercise band in each hand. Position your palms so that your thumbs are facing the ceiling (neutral position). 4. Bend each of your elbows to a 90-degree angle (right angle) and keep your upper arms at your sides. 5. Step back until the band is tight and there is no slack. 6. Slowly pull your elbows back behind you. 7. Hold for __________ seconds. 8. Slowly return to the starting position. Repeat __________ times. Complete this exercise __________ times a day. Shoulder press-ups  1. Sit in a stable chair that has armrests. Sit upright, with your feet flat on the floor. 2. Put your hands on the armrests so your elbows are bent and your fingers are pointing forward. Your hands should be about even with the sides of your body. 3. Push down on the armrests and use your arms to lift yourself off the chair. Straighten your elbows and lift yourself up as much as you comfortably can. ? Move your shoulder blades down, and avoid letting your shoulders move up toward your ears. ? Keep your feet on the ground. As you get stronger, your feet should support less of your body weight as you lift yourself up. 4. Hold for __________ seconds. 5. Slowly lower yourself back into the chair. Repeat __________ times. Complete this exercise __________ times a day. Wall push-ups  1. Stand so you are facing a stable wall. Your feet should be about one arm-length away from the wall. 2. Lean forward and place your palms on the wall at shoulder height. 3. Keep your feet flat on the floor as you bend your elbows and lean forward toward the wall. 4. Hold for __________ seconds. 5. Straighten your elbows to push yourself back to the starting position. Repeat __________ times. Complete this exercise  __________ times a day. This information is not intended to replace advice given to you by your health care provider. Make sure you discuss any questions you have with your health care provider. Document Released: 06/04/2005 Document Revised: 11/12/2018 Document Reviewed: 08/20/2018 [employer] Patient Education  2020 [employer]  

## 2019-06-29 ENCOUNTER — Ambulatory Visit: Payer: 59 | Admitting: Internal Medicine

## 2019-06-29 ENCOUNTER — Other Ambulatory Visit: Payer: Self-pay

## 2019-06-29 ENCOUNTER — Encounter: Payer: Self-pay | Admitting: Internal Medicine

## 2019-06-29 VITALS — BP 120/76 | HR 70 | Ht 64.0 in | Wt 182.0 lb

## 2019-06-29 DIAGNOSIS — E063 Autoimmune thyroiditis: Secondary | ICD-10-CM

## 2019-06-29 DIAGNOSIS — R5382 Chronic fatigue, unspecified: Secondary | ICD-10-CM

## 2019-06-29 DIAGNOSIS — E2740 Unspecified adrenocortical insufficiency: Secondary | ICD-10-CM | POA: Diagnosis not present

## 2019-06-29 DIAGNOSIS — R7989 Other specified abnormal findings of blood chemistry: Secondary | ICD-10-CM

## 2019-06-29 LAB — TSH: TSH: 1.69 u[IU]/mL (ref 0.35–4.50)

## 2019-06-29 LAB — VITAMIN B12: Vitamin B-12: 812 pg/mL (ref 211–911)

## 2019-06-29 LAB — T3, FREE: T3, Free: 3.6 pg/mL (ref 2.3–4.2)

## 2019-06-29 LAB — VITAMIN D 25 HYDROXY (VIT D DEFICIENCY, FRACTURES): VITD: 33.54 ng/mL (ref 30.00–100.00)

## 2019-06-29 LAB — T4, FREE: Free T4: 1.03 ng/dL (ref 0.60–1.60)

## 2019-06-29 NOTE — Patient Instructions (Addendum)
Please stop at the lab.  Please continue selenium 200 mcg daily.  Please come back for a follow-up appointment in 1 year.

## 2019-06-29 NOTE — Progress Notes (Addendum)
This visit occurred during the SARS-CoV-2 public health emergency.  Safety protocols were in place, including screening questions prior to the visit, additional usage of staff PPE, and extensive cleaning of exam room while observing appropriate contact time as indicated for disinfecting solutions.   HPI  Robin Maynard is a 53 y.o.-year-old female, whom I previously saw for low cortisol level (we ruled out adrenal insufficiency), then returning a year ago for euthyroid Hashimoto's thyroiditis.   Reviewed previous history: Patient describes that in ~2017 she started to be very tired and this exacerbated over time.  However, since 04/2018 she feels that her fatigue improved slightly.  Her PCP investigated her for possible autoimmune diseases and also chronic viral infections and the investigation was negative except for elevated thyroglobulin antibodies.  She was referred back to endocrinology.  Her TPO antibodies were high, giving her a diagnosis of Hashimoto's thyroiditis: Component     Latest Ref Rng & Units 10/26/2018  Thyroglobulin Ab     < or = 1 IU/mL 6 (H)  06/08/2018: Thyroglobulin Ab 6.7, TPO Ab 10  However, her TFTs were normal: Lab Results  Component Value Date   TSH 1.41 10/26/2018   TSH 1.48 06/08/2018   FREET4 1.01 10/26/2018  Free T4 0.75 Free T3 3.38  At last visit, I explained that thyroid hormones are not necessary for treatment but I suggested to start selenium 200 mcg daily to improve the antibody levels.  She did start a multivitamin with selenium and she felt much better afterwards.  She continues on this.  She continues to experience: - insomnia - fatigue (but improved) - heat intolerance (also improved)  He is currently under investigation by rheumatology.  She was found to have hypogammaglobulinemia.  Pt denies: - feeling nodules in neck - hoarseness - dysphagia - choking - SOB with lying down  She has no FH of thyroid disorders. No FH of thyroid  cancer. No h/o radiation tx to head or neck. No herbal supplements. No Biotin use. No recent steroids use.   She also has a history of low cortisol, but we ruled out adrenal insufficiency last year:  03/19/2017: cortisol 4.9 (am)  Office Visit on 08/10/2017  Component Date Value Ref Range Status  . [id number] ACTH 08/10/2017 20  6 - 50 pg/mL Final   Comment: Reference range applies only to specimens collected between 7am-10am   . Cortisol, Plasma 08/10/2017 7.7  ug/dL Final   AM:  4.3 - 22.4 ug/dLPM:  3.1 - 16.7 ug/dL  . Cortisol, Plasma 08/10/2017 19.2  ug/dL Final   AM:  4.3 - 22.4 ug/dLPM:  3.1 - 16.7 ug/dL  . Cortisol, Plasma 08/10/2017 22.7  ug/dL Final   AM:  4.3 - 22.4 ug/dLPM:  3.1 - 16.7 ug/dL   All labs normal (including stimulated cortisol >18.5) >> patient seen.  ROS: Constitutional: no weight gain/no weight loss, + also see HPI Eyes: no blurry vision, no xerophthalmia ENT: no sore throat, + see HPI Cardiovascular: no CP/no SOB/no palpitations/+ leg swelling Respiratory: no cough/no SOB/no wheezing Gastrointestinal: no N/no V/no D/no C/no acid reflux Musculoskeletal: no muscle aches/no joint aches Skin: no rashes, no hair loss Neurological: no tremors/no numbness/no tingling/no dizziness  I reviewed pt's medications, allergies, PMH, social hx, family hx, and changes were documented in the history of present illness. Otherwise, unchanged from my initial visit note.  Past Medical History:  Diagnosis Date  . Acute pancreatitis   . Constipation   . Gastritis   .  GERD (gastroesophageal reflux disease)    GI work up in progress  . Mass on back   . Obesity   . PONV (postoperative nausea and vomiting)    Past Surgical History:  Procedure Laterality Date  . DILITATION & CURRETTAGE/HYSTROSCOPY WITH HYDROTHERMAL ABLATION N/A 12/26/2016   Procedure: DILATATION & CURETTAGE/HYSTEROSCOPY WITH HYDROTHERMAL ABLATION;  Surgeon: Dian Queen, MD;  Location: [place];  Service: Gynecology;  Laterality: N/A;  . MASS EXCISION Right 02/23/2018   Procedure: EXCISION BACK MASS ERAS PATHWAY;  Surgeon: Rolm Bookbinder, MD;  Location: [place];  Service: General;  Laterality: Right;  . TONSILLECTOMY AND ADENOIDECTOMY    . TYMPANOSTOMY TUBE PLACEMENT     Social History   Socioeconomic History  . Marital status: Married    Spouse name: Not on file  . Number of children: Not on file  . Years of education: Not on file  . Highest education level: Not on file  Occupational History  . Not on file  Social Needs  . Financial resource strain: Not on file  . Food insecurity    Worry: Not on file    Inability: Not on file  . Transportation needs    Medical: Not on file    Non-medical: Not on file  Tobacco Use  . Smoking status: Never Smoker  . Smokeless tobacco: Never Used  Substance and Sexual Activity  . Alcohol use: No  . Drug use: No  . Sexual activity: Not on file    Comment: ablation  Lifestyle  . Physical activity    Days per week: Not on file    Minutes per session: Not on file  . Stress: Not on file  Relationships  . Social Herbalist on phone: Not on file    Gets together: Not on file    Attends religious service: Not on file    Active member of club or organization: Not on file    Attends meetings of clubs or organizations: Not on file    Relationship status: Not on file  . Intimate partner violence    Fear of current or ex partner: Not on file    Emotionally abused: Not on file    Physically abused: Not on file    Forced sexual activity: Not on file  Other Topics Concern  . Not on file  Social History Narrative  . Not on file   Current Outpatient Medications on File Prior to Visit  Medication Sig Dispense Refill  . DULoxetine (CYMBALTA) 30 MG capsule Take 1 capsule (30 mg total) by mouth daily. 30 capsule 1  . valACYclovir (VALTREX) 500 MG tablet Take 500 mg by mouth daily as needed (breakout).      Marland Kitchen zolpidem (AMBIEN) 10 MG tablet at bedtime as needed.      Current Facility-Administered Medications on File Prior to Visit  Medication Dose Route Frequency Provider Last Rate Last Dose  . 0.9 %  sodium chloride infusion  500 mL Intravenous Once Milus Banister, MD       Allergies  Allergen Reactions  . Doxycycline    Family History  Problem Relation Age of Onset  . Gastric cancer Father   . Diabetes Father   . Heart disease Father   . Hypertension Father   . Stomach cancer Father   . Deep vein thrombosis Mother   . Colon polyps Mother   . Heart disease Mother   . Diabetes Paternal Grandmother   .  Heart disease Paternal Grandmother   . Breast cancer Maternal Aunt   . Aortic stenosis Son   . Colon cancer Neg Hx   . Allergic rhinitis Neg Hx   . Asthma Neg Hx   . Angioedema Neg Hx   . Eczema Neg Hx   . Urticaria Neg Hx   . Rectal cancer Neg Hx   . Esophageal cancer Neg Hx     PE: BP 120/76   Pulse 70   Ht 5\' 4"  (1.626 m)   Wt 182 lb (82.6 kg)   SpO2 98%   BMI 31.24 kg/m  Wt Readings from Last 3 Encounters:  06/29/19 182 lb (82.6 kg)  06/21/19 183 lb (83 kg)  02/17/19 180 lb (81.6 kg)   Constitutional: overweight, in NAD Eyes: PERRLA, EOMI, no exophthalmos ENT: moist mucous membranes, no thyromegaly, no cervical lymphadenopathy Cardiovascular: RRR, No MRG Respiratory: CTA B Gastrointestinal: abdomen soft, NT, ND, BS+ Musculoskeletal: no deformities, strength intact in all 4 Skin: moist, warm, no rashes Neurological: no tremor with outstretched hands, DTR normal in all 4  ASSESSMENT: 1. Hashimoto thyroiditis  2. H/o low cortisol  3.  Fatigue  PLAN: 1. Hashimoto thyroiditis -I reviewed the results of her most recent TFTs and thyroid antibody tests with the patient.  TFTs were normal so she does not require levothyroxine.  Her ATA antibodies were slightly elevated and at last visit I suggested selenium.  She started this and felt much better.  She  continues a multivitamin with selenium. -At this visit, we will check her TFTs and will also add ATA antibodies.  If any disease did not decrease, I would suggest to continue her selenium since she is feeling much better on it.  She agrees with the plan.  We also discussed about reducing animal protein to help with general inflammation and also her leg swelling.  As of now, she is using a mostly paleo diet. -No neck compression symptoms -I will see the patient back in a year.  2. H/o low cortisol -Reviewed previous investigation from last year: She had a low cortisol level, however, cosyntropin stimulation test was normal -She feels better after starting a multivitamin with selenium.  She continues to have fatigue, but improved. -No further investigation needed for now  3.  Fatigue -Reviewed recent labs from rheumatology -We will rule out adrenal insufficiency in 08/2017 -She had previous investigation by GI -At this visit, she tells me that her fatigue is better after she started to take a multivitamin with selenium -Per her request, we will check a B12 and a vitamin D level today.  Office Visit on 06/29/2019  Component Date Value Ref Range Status  . TSH 06/29/2019 1.69  0.35 - 4.50 uIU/mL Final  . Free T4 06/29/2019 1.03  0.60 - 1.60 ng/dL Final   Comment: Specimens from patients who are undergoing biotin therapy and /or ingesting biotin supplements may contain high levels of biotin.  The higher biotin concentration in these specimens interferes with this Free T4 assay.  Specimens that contain high levels  of biotin may cause false high results for this Free T4 assay.  Please interpret results in light of the total clinical presentation of the patient.    . T3, Free 06/29/2019 3.6  2.3 - 4.2 pg/mL Final  . Thyroglobulin Ab 06/29/2019 5* < or = 1 IU/mL Final  . VITD 06/29/2019 33.54  30.00 - 100.00 ng/mL Final  . Vitamin B-12 06/29/2019 812  211 - 911 pg/mL Final  Message sent: Dear  Ms. Belvedere, The thyroid function tests are normal, while the thyroglobulin antibodies are slightly elevated, but better than before.  The vitamin D and B12 are normal. Sincerely, Philemon Kingdom MD  Philemon Kingdom, MD PhD [facility] Endocrinology

## 2019-07-01 LAB — THYROGLOBULIN ANTIBODY: Thyroglobulin Ab: 5 IU/mL — ABNORMAL HIGH (ref <=1)

## 2019-08-01 DIAGNOSIS — Z03818 Encounter for observation for suspected exposure to other biological agents ruled out: Secondary | ICD-10-CM | POA: Diagnosis not present

## 2019-08-08 ENCOUNTER — Ambulatory Visit: Payer: 59 | Admitting: Physician Assistant

## 2019-08-09 MED FILL — VALACYCLOVIR HCL 500 MG TAB: 500 | 90 days supply | Qty: 180 | Fill #0

## 2019-08-19 ENCOUNTER — Ambulatory Visit: Payer: 59 | Admitting: Rheumatology

## 2019-09-16 NOTE — Progress Notes (Deleted)
Office Visit Note  Patient: Robin Maynard             Date of Birth: 1966-05-25           MRN: [id number]             PCP: Caren Macadam, MD Referring: Caren Macadam, MD Visit Date: 09/22/2019 Occupation: @GUAROCC @  Subjective:  No chief complaint on file.   History of Present Illness: Robin Maynard is a 54 y.o. female ***   Activities of Daily Living:  Patient reports morning stiffness for *** {minute/hour:19697}.   Patient {ACTIONS;DENIES/REPORTS:21021675::"Denies"} nocturnal pain.  Difficulty dressing/grooming: {ACTIONS;DENIES/REPORTS:21021675::"Denies"} Difficulty climbing stairs: {ACTIONS;DENIES/REPORTS:21021675::"Denies"} Difficulty getting out of chair: {ACTIONS;DENIES/REPORTS:21021675::"Denies"} Difficulty using hands for taps, buttons, cutlery, and/or writing: {ACTIONS;DENIES/REPORTS:21021675::"Denies"}  No Rheumatology ROS completed.   PMFS History:  Patient Active Problem List   Diagnosis Date Noted  . Closed fracture of right clavicle 04/30/2019  . Abdominal pain, epigastric 09/30/2016  . Acute pancreatitis 09/27/2016  . Constipation 09/27/2016  . Overweight (BMI 25.0-29.9) 09/27/2016  . Mass of right lower leg 08/11/2013  . Mass on back 08/11/2013    Past Medical History:  Diagnosis Date  . Acute pancreatitis   . Constipation   . Gastritis   . GERD (gastroesophageal reflux disease)    GI work up in progress  . Mass on back   . Obesity   . PONV (postoperative nausea and vomiting)     Family History  Problem Relation Age of Onset  . Gastric cancer Father   . Diabetes Father   . Heart disease Father   . Hypertension Father   . Stomach cancer Father   . Deep vein thrombosis Mother   . Colon polyps Mother   . Heart disease Mother   . Diabetes Paternal Grandmother   . Heart disease Paternal Grandmother   . Breast cancer Maternal Aunt   . Aortic stenosis Son   . Colon cancer Neg Hx   . Allergic rhinitis Neg Hx   . Asthma Neg Hx   .  Angioedema Neg Hx   . Eczema Neg Hx   . Urticaria Neg Hx   . Rectal cancer Neg Hx   . Esophageal cancer Neg Hx    Past Surgical History:  Procedure Laterality Date  . DILITATION & CURRETTAGE/HYSTROSCOPY WITH HYDROTHERMAL ABLATION N/A 12/26/2016   Procedure: DILATATION & CURETTAGE/HYSTEROSCOPY WITH HYDROTHERMAL ABLATION;  Surgeon: Dian Queen, MD;  Location: [place];  Service: Gynecology;  Laterality: N/A;  . MASS EXCISION Right 02/23/2018   Procedure: EXCISION BACK MASS ERAS PATHWAY;  Surgeon: Rolm Bookbinder, MD;  Location: [place];  Service: General;  Laterality: Right;  . TONSILLECTOMY AND ADENOIDECTOMY    . TYMPANOSTOMY TUBE PLACEMENT     Social History   Social History Narrative  . Not on file    There is no immunization history on file for this patient.   Objective: Vital Signs: There were no vitals taken for this visit.   Physical Exam   Musculoskeletal Exam: ***  CDAI Exam: CDAI Score: -- Patient Global: --; Provider Global: -- Swollen: --; Tender: -- Joint Exam 09/22/2019   No joint exam has been documented for this visit   There is currently no information documented on the homunculus. Go to the Rheumatology activity and complete the homunculus joint exam.  Investigation: No additional findings.  Imaging: No results found.  Recent Labs: Lab Results  Component Value Date   WBC 6.9 01/26/2019   HGB  15.0 01/26/2019   PLT 264 01/26/2019   NA 139 01/26/2019   K 4.3 01/26/2019   CL 108 01/26/2019   CO2 26 01/26/2019   GLUCOSE 108 (H) 01/26/2019   BUN 24 01/26/2019   CREATININE 0.81 01/26/2019   BILITOT 0.6 01/26/2019   ALKPHOS 58 09/29/2016   AST 16 01/26/2019   ALT 24 01/26/2019   PROT 7.0 01/26/2019   PROT 6.7 01/26/2019   ALBUMIN 3.3 (L) 09/29/2016   CALCIUM 9.8 01/26/2019   GFRAA 96 01/26/2019    Speciality Comments: No specialty comments available.  Procedures:  No procedures  performed Allergies: Doxycycline   Assessment / Plan:     Visit Diagnoses: No diagnosis found.  Orders: No orders of the defined types were placed in this encounter.  No orders of the defined types were placed in this encounter.   Face-to-face time spent with patient was *** minutes. Greater than 50% of time was spent in counseling and coordination of care.  Follow-Up Instructions: No follow-ups on file.   Ofilia Neas, PA-C  Note - This record has been created using Dragon software.  Chart creation errors have been sought, but may not always  have been located. Such creation errors do not reflect on  the standard of medical care.

## 2019-09-22 ENCOUNTER — Ambulatory Visit: Payer: 59 | Admitting: Rheumatology

## 2019-09-29 DIAGNOSIS — R768 Other specified abnormal immunological findings in serum: Secondary | ICD-10-CM | POA: Diagnosis not present

## 2019-09-29 DIAGNOSIS — M2550 Pain in unspecified joint: Secondary | ICD-10-CM | POA: Diagnosis not present

## 2019-09-29 DIAGNOSIS — Z9229 Personal history of other drug therapy: Secondary | ICD-10-CM | POA: Diagnosis not present

## 2019-10-14 MED FILL — BUTALBITAL-APAP-CAFFEINE 50: 50-300-40 | 3 days supply | Qty: 20 | Fill #0

## 2019-10-24 MED FILL — BUTALBITAL-APAP-CAFFEINE 50: 50-300-40 | 3 days supply | Qty: 20 | Fill #0

## 2019-10-27 DIAGNOSIS — M2550 Pain in unspecified joint: Secondary | ICD-10-CM | POA: Diagnosis not present

## 2019-10-27 DIAGNOSIS — M7918 Myalgia, other site: Secondary | ICD-10-CM | POA: Diagnosis not present

## 2019-10-31 DIAGNOSIS — M791 Myalgia, unspecified site: Secondary | ICD-10-CM | POA: Diagnosis not present

## 2019-10-31 DIAGNOSIS — M180 Bilateral primary osteoarthritis of first carpometacarpal joints: Secondary | ICD-10-CM | POA: Diagnosis not present

## 2019-10-31 DIAGNOSIS — K589 Irritable bowel syndrome without diarrhea: Secondary | ICD-10-CM | POA: Diagnosis not present

## 2019-10-31 DIAGNOSIS — M546 Pain in thoracic spine: Secondary | ICD-10-CM | POA: Diagnosis not present

## 2019-10-31 DIAGNOSIS — M19072 Primary osteoarthritis, left ankle and foot: Secondary | ICD-10-CM | POA: Diagnosis not present

## 2019-10-31 DIAGNOSIS — M50322 Other cervical disc degeneration at C5-C6 level: Secondary | ICD-10-CM | POA: Diagnosis not present

## 2019-10-31 DIAGNOSIS — M7061 Trochanteric bursitis, right hip: Secondary | ICD-10-CM | POA: Diagnosis not present

## 2019-10-31 DIAGNOSIS — M19042 Primary osteoarthritis, left hand: Secondary | ICD-10-CM | POA: Diagnosis not present

## 2019-10-31 DIAGNOSIS — M19041 Primary osteoarthritis, right hand: Secondary | ICD-10-CM | POA: Diagnosis not present

## 2019-10-31 DIAGNOSIS — G4700 Insomnia, unspecified: Secondary | ICD-10-CM | POA: Diagnosis not present

## 2019-10-31 DIAGNOSIS — M19071 Primary osteoarthritis, right ankle and foot: Secondary | ICD-10-CM | POA: Diagnosis not present

## 2019-10-31 DIAGNOSIS — H04123 Dry eye syndrome of bilateral lacrimal glands: Secondary | ICD-10-CM | POA: Diagnosis not present

## 2019-10-31 DIAGNOSIS — M722 Plantar fascial fibromatosis: Secondary | ICD-10-CM | POA: Diagnosis not present

## 2019-10-31 DIAGNOSIS — M2550 Pain in unspecified joint: Secondary | ICD-10-CM | POA: Diagnosis not present

## 2019-10-31 DIAGNOSIS — K859 Acute pancreatitis without necrosis or infection, unspecified: Secondary | ICD-10-CM | POA: Diagnosis not present

## 2019-11-07 DIAGNOSIS — M722 Plantar fascial fibromatosis: Secondary | ICD-10-CM | POA: Diagnosis not present

## 2019-11-09 MED FILL — BUTALBITAL-APAP-CAFFEINE 50: 50-300-40 | 3 days supply | Qty: 20 | Fill #0

## 2019-11-09 MED FILL — CELECOXIB 100 MG CAPS: 100 | 30 days supply | Qty: 60 | Fill #0

## 2019-11-16 ENCOUNTER — Other Ambulatory Visit: Payer: Self-pay | Admitting: Obstetrics and Gynecology

## 2019-11-16 DIAGNOSIS — N6489 Other specified disorders of breast: Secondary | ICD-10-CM

## 2019-11-18 ENCOUNTER — Other Ambulatory Visit: Payer: Self-pay

## 2019-11-18 ENCOUNTER — Ambulatory Visit
Admission: RE | Admit: 2019-11-18 | Discharge: 2019-11-18 | Disposition: A | Discharge status: Home / Self Care | Payer: 59 | Source: Ambulatory Visit | Attending: Obstetrics and Gynecology | Admitting: Obstetrics and Gynecology

## 2019-11-18 ENCOUNTER — Ambulatory Visit: Payer: 59

## 2019-11-18 DIAGNOSIS — R928 Other abnormal and inconclusive findings on diagnostic imaging of breast: Secondary | ICD-10-CM | POA: Diagnosis not present

## 2019-11-18 DIAGNOSIS — N6489 Other specified disorders of breast: Secondary | ICD-10-CM

## 2020-02-09 DIAGNOSIS — Z1159 Encounter for screening for other viral diseases: Secondary | ICD-10-CM | POA: Diagnosis not present

## 2020-02-09 DIAGNOSIS — Z0000 Encounter for general adult medical examination without abnormal findings: Secondary | ICD-10-CM | POA: Diagnosis not present

## 2020-02-09 DIAGNOSIS — E669 Obesity, unspecified: Secondary | ICD-10-CM | POA: Diagnosis not present

## 2020-02-09 DIAGNOSIS — D229 Melanocytic nevi, unspecified: Secondary | ICD-10-CM | POA: Diagnosis not present

## 2020-02-09 DIAGNOSIS — Z13220 Encounter for screening for lipoid disorders: Secondary | ICD-10-CM | POA: Diagnosis not present

## 2020-02-15 DIAGNOSIS — B999 Unspecified infectious disease: Secondary | ICD-10-CM | POA: Diagnosis not present

## 2020-02-16 ENCOUNTER — Other Ambulatory Visit (HOSPITAL_COMMUNITY): Payer: Self-pay | Admitting: Obstetrics and Gynecology

## 2020-02-16 MED FILL — ZOLPIDEM TARTRATE 10 MG TAB: 10 | 30 days supply | Qty: 30 | Fill #0

## 2020-02-17 MED FILL — VALACYCLOVIR HCL 500 MG TAB: 500 | 90 days supply | Qty: 180 | Fill #0

## 2020-04-26 ENCOUNTER — Other Ambulatory Visit (HOSPITAL_COMMUNITY): Payer: Self-pay | Admitting: Rheumatology

## 2020-04-26 DIAGNOSIS — M545 Low back pain: Secondary | ICD-10-CM | POA: Diagnosis not present

## 2020-04-26 DIAGNOSIS — M47817 Spondylosis without myelopathy or radiculopathy, lumbosacral region: Secondary | ICD-10-CM | POA: Diagnosis not present

## 2020-04-26 DIAGNOSIS — M2550 Pain in unspecified joint: Secondary | ICD-10-CM | POA: Diagnosis not present

## 2020-04-26 DIAGNOSIS — M549 Dorsalgia, unspecified: Secondary | ICD-10-CM | POA: Diagnosis not present

## 2020-04-26 DIAGNOSIS — G8929 Other chronic pain: Secondary | ICD-10-CM | POA: Diagnosis not present

## 2020-04-26 DIAGNOSIS — M7061 Trochanteric bursitis, right hip: Secondary | ICD-10-CM | POA: Diagnosis not present

## 2020-04-26 DIAGNOSIS — M722 Plantar fascial fibromatosis: Secondary | ICD-10-CM | POA: Diagnosis not present

## 2020-04-26 MED FILL — MELOXICAM 15 MG TABLET: 15 | 30 days supply | Qty: 30 | Fill #0

## 2020-05-09 MED FILL — MELOXICAM 15 MG TABLET: 15 | 30 days supply | Qty: 30 | Fill #0

## 2020-05-15 DIAGNOSIS — H524 Presbyopia: Secondary | ICD-10-CM | POA: Diagnosis not present

## 2020-05-15 DIAGNOSIS — H52223 Regular astigmatism, bilateral: Secondary | ICD-10-CM | POA: Diagnosis not present

## 2020-05-15 DIAGNOSIS — H5213 Myopia, bilateral: Secondary | ICD-10-CM | POA: Diagnosis not present

## 2020-05-22 DIAGNOSIS — L905 Scar conditions and fibrosis of skin: Secondary | ICD-10-CM | POA: Diagnosis not present

## 2020-05-22 DIAGNOSIS — D225 Melanocytic nevi of trunk: Secondary | ICD-10-CM | POA: Diagnosis not present

## 2020-05-22 DIAGNOSIS — L814 Other melanin hyperpigmentation: Secondary | ICD-10-CM | POA: Diagnosis not present

## 2020-05-22 DIAGNOSIS — L821 Other seborrheic keratosis: Secondary | ICD-10-CM | POA: Diagnosis not present

## 2020-05-22 DIAGNOSIS — D2262 Melanocytic nevi of left upper limb, including shoulder: Secondary | ICD-10-CM | POA: Diagnosis not present

## 2020-05-23 DIAGNOSIS — M549 Dorsalgia, unspecified: Secondary | ICD-10-CM | POA: Diagnosis not present

## 2020-05-23 DIAGNOSIS — M791 Myalgia, unspecified site: Secondary | ICD-10-CM | POA: Diagnosis not present

## 2020-05-23 DIAGNOSIS — M79604 Pain in right leg: Secondary | ICD-10-CM | POA: Diagnosis not present

## 2020-05-23 DIAGNOSIS — R269 Unspecified abnormalities of gait and mobility: Secondary | ICD-10-CM | POA: Diagnosis not present

## 2020-05-28 ENCOUNTER — Other Ambulatory Visit (HOSPITAL_COMMUNITY): Payer: Self-pay | Admitting: Rheumatology

## 2020-05-28 MED FILL — HYDROXYCHLOROQUINE 200 MG T: 200 | 30 days supply | Qty: 30 | Fill #0

## 2020-06-01 DIAGNOSIS — M79604 Pain in right leg: Secondary | ICD-10-CM | POA: Diagnosis not present

## 2020-06-01 DIAGNOSIS — M549 Dorsalgia, unspecified: Secondary | ICD-10-CM | POA: Diagnosis not present

## 2020-06-01 DIAGNOSIS — M791 Myalgia, unspecified site: Secondary | ICD-10-CM | POA: Diagnosis not present

## 2020-06-01 DIAGNOSIS — R269 Unspecified abnormalities of gait and mobility: Secondary | ICD-10-CM | POA: Diagnosis not present

## 2020-06-07 MED FILL — HYDROXYCHLOROQUINE 200 MG T: 200 | 30 days supply | Qty: 30 | Fill #0

## 2020-06-13 DIAGNOSIS — R269 Unspecified abnormalities of gait and mobility: Secondary | ICD-10-CM | POA: Diagnosis not present

## 2020-06-13 DIAGNOSIS — M549 Dorsalgia, unspecified: Secondary | ICD-10-CM | POA: Diagnosis not present

## 2020-06-13 DIAGNOSIS — M791 Myalgia, unspecified site: Secondary | ICD-10-CM | POA: Diagnosis not present

## 2020-06-13 DIAGNOSIS — M79604 Pain in right leg: Secondary | ICD-10-CM | POA: Diagnosis not present

## 2020-06-18 ENCOUNTER — Other Ambulatory Visit (HOSPITAL_COMMUNITY): Payer: Self-pay | Admitting: Rheumatology

## 2020-06-18 DIAGNOSIS — Z791 Long term (current) use of non-steroidal anti-inflammatories (NSAID): Secondary | ICD-10-CM | POA: Diagnosis not present

## 2020-06-18 DIAGNOSIS — M533 Sacrococcygeal disorders, not elsewhere classified: Secondary | ICD-10-CM | POA: Diagnosis not present

## 2020-06-18 DIAGNOSIS — H04123 Dry eye syndrome of bilateral lacrimal glands: Secondary | ICD-10-CM | POA: Diagnosis not present

## 2020-06-18 MED FILL — ESOMEPRAZOLE MAG DR 40 MG C: 40 | 30 days supply | Qty: 30 | Fill #0

## 2020-06-18 MED FILL — DICLOFENAC SODIUM 1 % GEL: 1 | 12 days supply | Qty: 100 | Fill #0

## 2020-06-19 DIAGNOSIS — R269 Unspecified abnormalities of gait and mobility: Secondary | ICD-10-CM | POA: Diagnosis not present

## 2020-06-19 DIAGNOSIS — M79604 Pain in right leg: Secondary | ICD-10-CM | POA: Diagnosis not present

## 2020-06-19 DIAGNOSIS — M549 Dorsalgia, unspecified: Secondary | ICD-10-CM | POA: Diagnosis not present

## 2020-06-19 DIAGNOSIS — M791 Myalgia, unspecified site: Secondary | ICD-10-CM | POA: Diagnosis not present

## 2020-06-25 DIAGNOSIS — M549 Dorsalgia, unspecified: Secondary | ICD-10-CM | POA: Diagnosis not present

## 2020-06-25 DIAGNOSIS — R269 Unspecified abnormalities of gait and mobility: Secondary | ICD-10-CM | POA: Diagnosis not present

## 2020-06-25 DIAGNOSIS — M791 Myalgia, unspecified site: Secondary | ICD-10-CM | POA: Diagnosis not present

## 2020-06-25 DIAGNOSIS — M79604 Pain in right leg: Secondary | ICD-10-CM | POA: Diagnosis not present

## 2020-07-03 ENCOUNTER — Encounter: Payer: Self-pay | Admitting: Internal Medicine

## 2020-07-03 ENCOUNTER — Other Ambulatory Visit: Payer: Self-pay

## 2020-07-03 ENCOUNTER — Ambulatory Visit (INDEPENDENT_AMBULATORY_CARE_PROVIDER_SITE_OTHER): Payer: 59 | Admitting: Internal Medicine

## 2020-07-03 VITALS — BP 118/77 | HR 67 | Ht 64.0 in | Wt 184.2 lb

## 2020-07-03 DIAGNOSIS — E2740 Unspecified adrenocortical insufficiency: Secondary | ICD-10-CM | POA: Diagnosis not present

## 2020-07-03 DIAGNOSIS — E063 Autoimmune thyroiditis: Secondary | ICD-10-CM

## 2020-07-03 DIAGNOSIS — R7989 Other specified abnormal findings of blood chemistry: Secondary | ICD-10-CM

## 2020-07-03 LAB — TSH: TSH: 1.58 u[IU]/mL (ref 0.35–4.50)

## 2020-07-03 LAB — T3, FREE: T3, Free: 3.3 pg/mL (ref 2.3–4.2)

## 2020-07-03 NOTE — Progress Notes (Signed)
Patient ID: Robin Maynard, female   DOB: 1966-05-10, 54 y.o.   MRN: [id number]   This visit occurred during the SARS-CoV-2 public health emergency.  Safety protocols were in place, including screening questions prior to the visit, additional usage of staff PPE, and extensive cleaning of exam room while observing appropriate contact time as indicated for disinfecting solutions.   HPI  Robin Maynard is a 54 y.o.-year-old female, initially referred by her PCP, Dr.Le, returning for follow-up for for low cortisol level -ruled out for adrenal insufficiency, also, euthyroid Hashimoto's thyroiditis.  Last visit a year ago.  Reviewed history: She had URI 09/2016 >> L upper abd. Pain >> ED >> CT scan - hazing around sSMA >> referred to Dr. Ardis Hughs >> further investig. Was negative >> sent back to PCP, who checked a cortisol level >> low cortisol level in 03/2017.  No h/o steroid use in the past (Dexamethasone 10 mg in 12/2016). No h/o Depo-provera, Megace, po ketoconazole, phenytoin, rifampin, chronic fluconazole use. No h/o autoimmune diseases in pt or family mbs. No excess use of NSAIDs. No h/o generalized infections or HIV. No IVDA. No h/o head injury or severe HA. No h/o malignancy.  Reviewed previous investigation:  Office Visit on 08/10/2017  Component Date Value Ref Range Status  . [id number] ACTH 08/10/2017 20  6 - 50 pg/mL Final   Comment: Reference range applies only to specimens collected between 7am-10am   . Cortisol, Plasma 08/10/2017 7.7  ug/dL Final   AM:  4.3 - 22.4 ug/dLPM:  3.1 - 16.7 ug/dL  . Cortisol, Plasma 08/10/2017 19.2  ug/dL Final   AM:  4.3 - 22.4 ug/dLPM:  3.1 - 16.7 ug/dL  . Cortisol, Plasma 08/10/2017 22.7  ug/dL Final   AM:  4.3 - 22.4 ug/dLPM:  3.1 - 16.7 ug/dL   03/19/2017: cortisol 4.9 (am)  No history of hyponatremia or hyperkalemia:   Chemistry      Component Value Date/Time   NA 139 01/26/2019 0909   K 4.3 01/26/2019 0909   CL 108 01/26/2019  0909   CO2 26 01/26/2019 0909   BUN 24 01/26/2019 0909   CREATININE 0.81 01/26/2019 0909      Component Value Date/Time   CALCIUM 9.8 01/26/2019 0909   ALKPHOS 58 09/29/2016 0524   AST 16 01/26/2019 0909   ALT 24 01/26/2019 0909   BILITOT 0.6 01/26/2019 0909     Pt. also has a history of DUB, s/p ablation 12/2016, GERD, elevated FSH of 53 in 03/2017.  Reviewed history: Patient describes that in ~2017 she started to be very tired and this exacerbated over time.  However, since 04/2018 she feels that her fatigue improved slightly.  Her PCP investigated her for possible autoimmune diseases and also chronic viral infections and the investigation was negative except for elevated thyroglobulin antibodies.  She was referred back to endocrinology.  Her TPO antibodies were high, giving her a diagnosis of Hashimoto's thyroiditis: Component     Latest Ref Rng & Units 10/26/2018  Thyroglobulin Ab     < or = 1 IU/mL 6 (H)  06/08/2018: Thyroglobulin Ab 6.7, TPO Ab 10  We checked several tests at last visit and her antithyroid antibodies were slightly elevated.  Her TFTs, vitamin D and B12 were normal: Component     Latest Ref Rng & Units 06/29/2019  TSH     0.35 - 4.50 uIU/mL 1.69  T4,Free(Direct)     0.60 - 1.60 ng/dL 1.03  Triiodothyronine,Free,Serum  2.3 - 4.2 pg/mL 3.6  Thyroglobulin Ab     < or = 1 IU/mL 5 (H)  VITD     30.00 - 100.00 ng/mL 33.54  Vitamin B12     211 - 911 pg/mL 812   Previous TSH levels: Lab Results  Component Value Date   TSH 1.69 06/29/2019   TSH 1.41 10/26/2018   TSH 1.48 06/08/2018   I recommended to start selenium to help lower her antithyroid antibodies.  She started a multivitamin + Selenium 150 mcg daily and she felt much better afterwards.  She is still on this. This contains Biotin 1200 mcg >> last dose 3 days ago.  At last visit she was under investigation by rheumatology.  She was found to have hypogammaglobulinemia. She also saw an immunologist  >> she was reassured about this condition.  However, she was then seen by rheumatology for a second opinion at [facility] >> possible RA. Started HO Chloroquine >> nausea, GERD, metallic taste in mouth >> stopped.  She is on Turmeric, ginger root, omega 3 FAs, vitamin D 2000 + 5000 units.  ROS: Constitutional: + Weight gain, + slight fatigue, no subjective hyperthermia, no subjective hypothermia Eyes: no blurry vision, no xerophthalmia ENT: no sore throat, no nodules palpated in neck, no dysphagia, no odynophagia, no hoarseness Cardiovascular: no CP/no SOB/no palpitations/no leg swelling Respiratory: no cough/no SOB/no wheezing Gastrointestinal: no N/no V/no D/no C/no acid reflux Musculoskeletal: no muscle aches/no joint aches Skin: no rashes, no hair loss Neurological: no tremors/no numbness/no tingling/no dizziness  I reviewed pt's medications, allergies, PMH, social hx, family hx, and changes were documented in the history of present illness. Otherwise, unchanged from my initial visit note.  Past Medical History:  Diagnosis Date  . Acute pancreatitis   . Constipation   . Gastritis   . GERD (gastroesophageal reflux disease)    GI work up in progress  . Mass on back   . Obesity   . PONV (postoperative nausea and vomiting)    Past Surgical History:  Procedure Laterality Date  . DILITATION & CURRETTAGE/HYSTROSCOPY WITH HYDROTHERMAL ABLATION N/A 12/26/2016   Procedure: DILATATION & CURETTAGE/HYSTEROSCOPY WITH HYDROTHERMAL ABLATION;  Surgeon: Dian Queen, MD;  Location: [place];  Service: Gynecology;  Laterality: N/A;  . MASS EXCISION Right 02/23/2018   Procedure: EXCISION BACK MASS ERAS PATHWAY;  Surgeon: Rolm Bookbinder, MD;  Location: [place];  Service: General;  Laterality: Right;  . TONSILLECTOMY AND ADENOIDECTOMY    . TYMPANOSTOMY TUBE PLACEMENT     Social History   Socioeconomic History  . Marital status: Married    Spouse name: Not on  file  . Number of children: 1  Social Needs  Occupational History  . CRNA - [employer]  Tobacco Use  . Smoking status: Never Smoker  . Smokeless tobacco: Never Used  Substance and Sexual Activity  . Alcohol use: No  . Drug use: No   Current Outpatient Medications on File Prior to Visit  Medication Sig Dispense Refill  . DULoxetine (CYMBALTA) 30 MG capsule Take 1 capsule (30 mg total) by mouth daily. (Patient not taking: Reported on 06/29/2019) 30 capsule 1  . valACYclovir (VALTREX) 500 MG tablet Take 500 mg by mouth daily as needed (breakout).     Marland Kitchen zolpidem (AMBIEN) 10 MG tablet at bedtime as needed.      Current Facility-Administered Medications on File Prior to Visit  Medication Dose Route Frequency Provider Last Rate Last Admin  . 0.9 %  sodium chloride infusion  500 mL Intravenous Once Milus Banister, MD       Allergies  Allergen Reactions  . Doxycycline    Family History  Problem Relation Age of Onset  . Gastric cancer Father   . Diabetes Father   . Heart disease Father   . Hypertension Father   . Stomach cancer Father   . Deep vein thrombosis Mother   . Colon polyps Mother   . Heart disease Mother   . Diabetes Paternal Grandmother   . Heart disease Paternal Grandmother   . Breast cancer Maternal Aunt   . Aortic stenosis Son   . Colon cancer Neg Hx   . Allergic rhinitis Neg Hx   . Asthma Neg Hx   . Angioedema Neg Hx   . Eczema Neg Hx   . Urticaria Neg Hx   . Rectal cancer Neg Hx   . Esophageal cancer Neg Hx     PE: BP 118/77   Pulse 67   Ht 5\' 4"  (1.626 m)   Wt 184 lb 3.2 oz (83.6 kg)   SpO2 97%   BMI 31.62 kg/m  Wt Readings from Last 3 Encounters:  07/03/20 184 lb 3.2 oz (83.6 kg)  06/29/19 182 lb (82.6 kg)  06/21/19 183 lb (83 kg)   Constitutional: overweight, in NAD Eyes: PERRLA, EOMI, no exophthalmos ENT: moist mucous membranes, no thyromegaly, no cervical lymphadenopathy Cardiovascular: RRR, No MRG Respiratory: CTA B Gastrointestinal:  abdomen soft, NT, ND, BS+ Musculoskeletal: no deformities, strength intact in all 4 Skin: moist, warm, no rashes Neurological: no tremor with outstretched hands, DTR normal in all 4  ASSESSMENT: 1. Low cortisol level  2.  Euthyroid Hashimoto's thyroiditis  PLAN:  1. Low cortisol level -Patient with history of fatigue, abdominal pain, and a slightly low am cortisol level as checked in 03/2017, -At last visit, she remembered feeling great after a 10 mg Decadron dose that she was given at the time of her endometrial ablation in 12/2016 - discussed that this is not unusual, even in pt's w/o adrenal insufficiency -Of note, her adrenals appears normal on review of the CT scan images from Dec 11, 2016 -We checked a cosyntropin stimulation test and this was normal in 2019 -At this visit, she does not complain of weight loss and she does not have significant fatigue  2. Euthyroid Hashimoto's thyroiditis -Diagnosed based on elevated TPO antibodies >> on selenium supplements -Review latest TFTs and they were normal in 06/2019 -We discussed about treatment for Hashimoto thyroiditis, which is actually limited to thyroid hormones in case her TFTs are abnormal. Supplements like selenium has been tried with various results, some showing improvement in the TPO antibodies. However, there are no randomized controlled trials of this are consistent results between trials.  After she started her multivitamin with 150 mcg of selenium, she started to feel much better.  She is on the same supplement now, and I advised her to continue. -We will recheck her TFTs now along with antibody levels.  Orders Placed This Encounter  Procedures  . TSH  . T4, free  . T3, free  . Thyroglobulin antibody  . Thyroid peroxidase antibody   Component     Latest Ref Rng & Units 07/03/2020  TSH     0.35 - 4.50 uIU/mL 1.58  Thyroglobulin Ab     < or = 1 IU/mL 8 (H)  Triiodothyronine,Free,Serum     2.3 - 4.2 pg/mL 3.3   T4,Free(Direct)  0.60 - 1.60 ng/dL 0.83  Thyroperoxidase Ab SerPl-aCnc     <9 IU/mL 2  TFTs are normal.  Thyroglobulin antibodies are slightly higher.  Philemon Kingdom, MD PhD [facility] Endocrinology

## 2020-07-03 NOTE — Patient Instructions (Addendum)
Please stop at the lab.  Please continue multivitamins with selenium.  Please come back for a follow-up appointment in 1 year.

## 2020-07-04 LAB — T4, FREE: Free T4: 0.83 ng/dL (ref 0.60–1.60)

## 2020-07-04 LAB — THYROID PEROXIDASE ANTIBODY: Thyroperoxidase Ab SerPl-aCnc: 2 IU/mL (ref <9)

## 2020-07-04 LAB — THYROGLOBULIN ANTIBODY: Thyroglobulin Ab: 8 IU/mL — ABNORMAL HIGH (ref <=1)

## 2020-07-05 DIAGNOSIS — M791 Myalgia, unspecified site: Secondary | ICD-10-CM | POA: Diagnosis not present

## 2020-07-05 DIAGNOSIS — M549 Dorsalgia, unspecified: Secondary | ICD-10-CM | POA: Diagnosis not present

## 2020-07-05 DIAGNOSIS — M79604 Pain in right leg: Secondary | ICD-10-CM | POA: Diagnosis not present

## 2020-07-05 DIAGNOSIS — R269 Unspecified abnormalities of gait and mobility: Secondary | ICD-10-CM | POA: Diagnosis not present

## 2020-07-09 DIAGNOSIS — M533 Sacrococcygeal disorders, not elsewhere classified: Secondary | ICD-10-CM | POA: Diagnosis not present

## 2020-07-12 ENCOUNTER — Other Ambulatory Visit (HOSPITAL_COMMUNITY): Payer: Self-pay | Admitting: Family Medicine

## 2020-07-13 MED FILL — VALACYCLOVIR HCL 500 MG TAB: 500 | 90 days supply | Qty: 180 | Fill #0

## 2020-08-08 MED FILL — ZOLPIDEM TARTRATE 10 MG TAB: 10 | 30 days supply | Qty: 30 | Fill #1

## 2020-08-16 ENCOUNTER — Other Ambulatory Visit (HOSPITAL_COMMUNITY): Payer: Self-pay | Admitting: Rheumatology

## 2020-08-16 DIAGNOSIS — Z8719 Personal history of other diseases of the digestive system: Secondary | ICD-10-CM | POA: Diagnosis not present

## 2020-08-16 DIAGNOSIS — M1990 Unspecified osteoarthritis, unspecified site: Secondary | ICD-10-CM | POA: Diagnosis not present

## 2020-08-16 DIAGNOSIS — M064 Inflammatory polyarthropathy: Secondary | ICD-10-CM | POA: Diagnosis not present

## 2020-08-16 DIAGNOSIS — Z840 Family history of diseases of the skin and subcutaneous tissue: Secondary | ICD-10-CM | POA: Diagnosis not present

## 2020-08-16 DIAGNOSIS — Z79899 Other long term (current) drug therapy: Secondary | ICD-10-CM | POA: Diagnosis not present

## 2020-08-16 MED FILL — tiZANidine HCL 2 MG TABS: 2 | 15 days supply | Qty: 30 | Fill #0

## 2020-08-16 MED FILL — FOLIC ACID 1 MG TABS: 1 | 90 days supply | Qty: 90 | Fill #0

## 2020-08-16 MED FILL — METHOTREXATE SODIUM 2.5 MG: 2.5 | 28 days supply | Qty: 12 | Fill #0

## 2020-10-12 DIAGNOSIS — M1990 Unspecified osteoarthritis, unspecified site: Secondary | ICD-10-CM | POA: Diagnosis not present

## 2020-10-12 DIAGNOSIS — D849 Immunodeficiency, unspecified: Secondary | ICD-10-CM | POA: Diagnosis not present

## 2020-10-15 DIAGNOSIS — M791 Myalgia, unspecified site: Secondary | ICD-10-CM | POA: Diagnosis not present

## 2020-10-15 DIAGNOSIS — M79641 Pain in right hand: Secondary | ICD-10-CM | POA: Diagnosis not present

## 2020-10-15 DIAGNOSIS — M545 Low back pain, unspecified: Secondary | ICD-10-CM | POA: Diagnosis not present

## 2020-10-15 DIAGNOSIS — M25511 Pain in right shoulder: Secondary | ICD-10-CM | POA: Diagnosis not present

## 2020-10-15 DIAGNOSIS — M79672 Pain in left foot: Secondary | ICD-10-CM | POA: Diagnosis not present

## 2020-10-15 DIAGNOSIS — R269 Unspecified abnormalities of gait and mobility: Secondary | ICD-10-CM | POA: Diagnosis not present

## 2020-10-23 ENCOUNTER — Other Ambulatory Visit: Payer: Self-pay | Admitting: Obstetrics and Gynecology

## 2020-10-23 DIAGNOSIS — N6489 Other specified disorders of breast: Secondary | ICD-10-CM

## 2020-11-07 ENCOUNTER — Other Ambulatory Visit (HOSPITAL_COMMUNITY): Payer: Self-pay

## 2020-11-07 MED FILL — Valacyclovir HCl Tab 500 MG: ORAL | 60 days supply | Qty: 180 | Fill #0 | Status: AC

## 2020-12-04 ENCOUNTER — Other Ambulatory Visit: Payer: Self-pay

## 2020-12-04 ENCOUNTER — Ambulatory Visit
Admission: RE | Admit: 2020-12-04 | Discharge: 2020-12-04 | Disposition: A | Discharge status: Home / Self Care | Payer: 59 | Source: Ambulatory Visit | Attending: Obstetrics and Gynecology | Admitting: Obstetrics and Gynecology

## 2020-12-04 ENCOUNTER — Other Ambulatory Visit (HOSPITAL_COMMUNITY): Payer: Self-pay

## 2020-12-04 DIAGNOSIS — N6489 Other specified disorders of breast: Secondary | ICD-10-CM

## 2020-12-04 DIAGNOSIS — R928 Other abnormal and inconclusive findings on diagnostic imaging of breast: Secondary | ICD-10-CM | POA: Diagnosis not present

## 2020-12-06 ENCOUNTER — Other Ambulatory Visit (HOSPITAL_COMMUNITY): Payer: Self-pay

## 2020-12-06 MED ORDER — METHOTREXATE SODIUM 2.5 MG PO TABS
ORAL_TABLET | ORAL | 0 refills | Status: DC
  Filled 2020-12-06: qty 12, 84d supply, fill #0

## 2020-12-07 IMAGING — MG DIGITAL DIAGNOSTIC BILAT W/ TOMO W/ CAD
6 of 10 series · 6 of 30 positions shown · non-contrast
Comparison: Previous exam(s).

CLINICAL DATA: 1+ year interval follow-up of a likely benign focal
asymmetry involving the UPPER LEFT breast at far POSTERIOR depth,
likely an isolated island of fibroglandular tissue. Annual
evaluation, RIGHT breast.

EXAM:
DIGITAL DIAGNOSTIC BILATERAL MAMMOGRAM WITH CAD AND TOMO

[L MLO synth-2D]
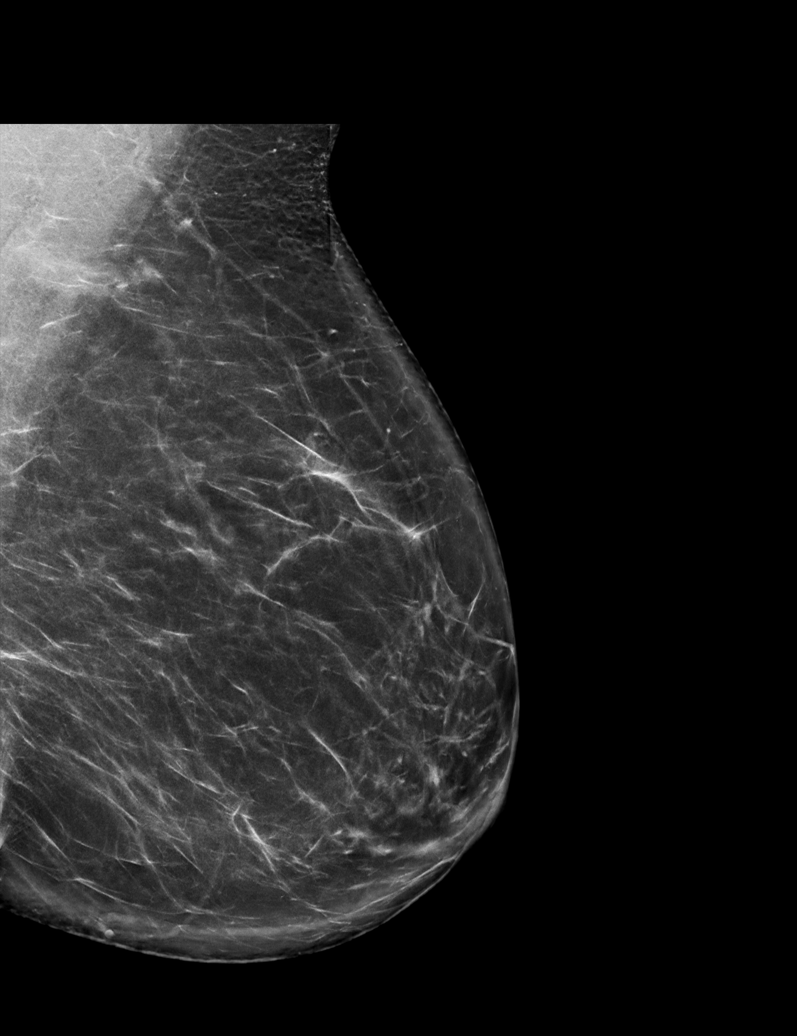

[L XCCL synth-2D]
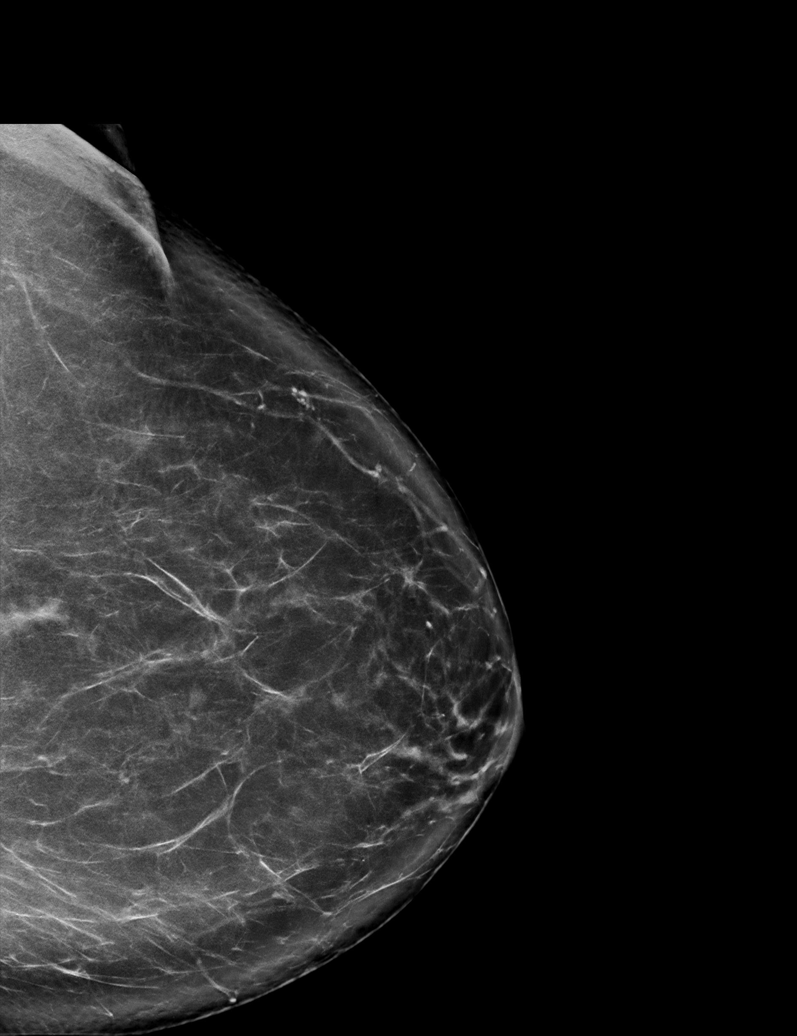

[R CC synth-2D]
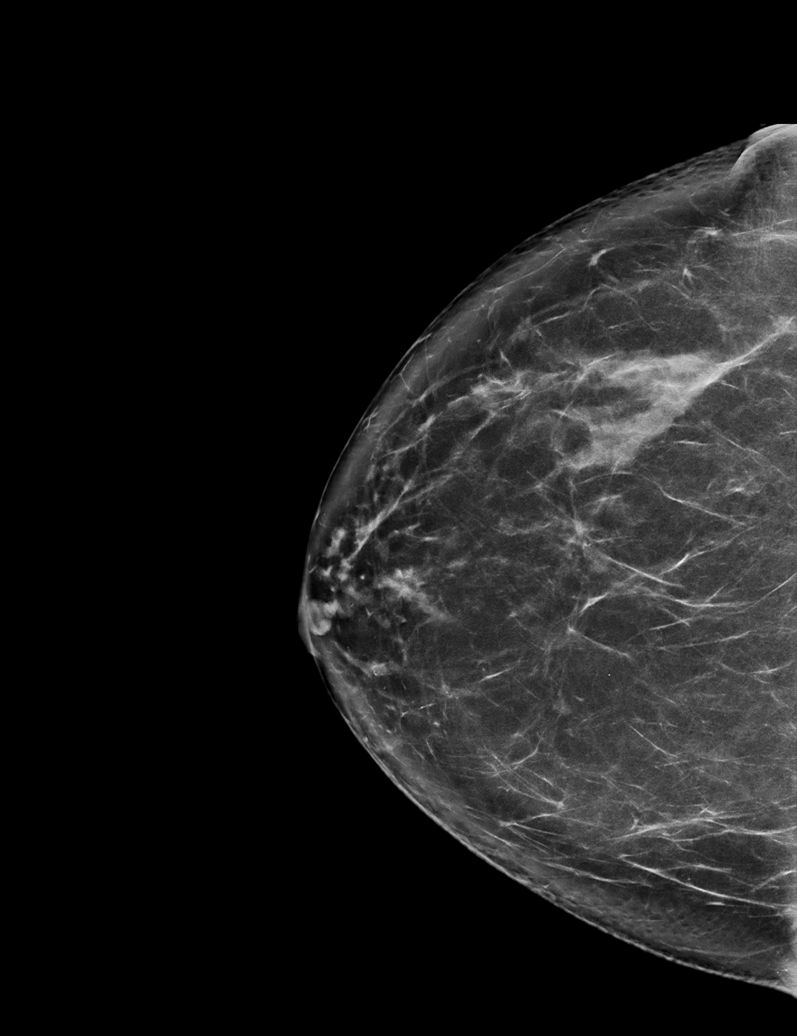

[R MLO synth-2D]
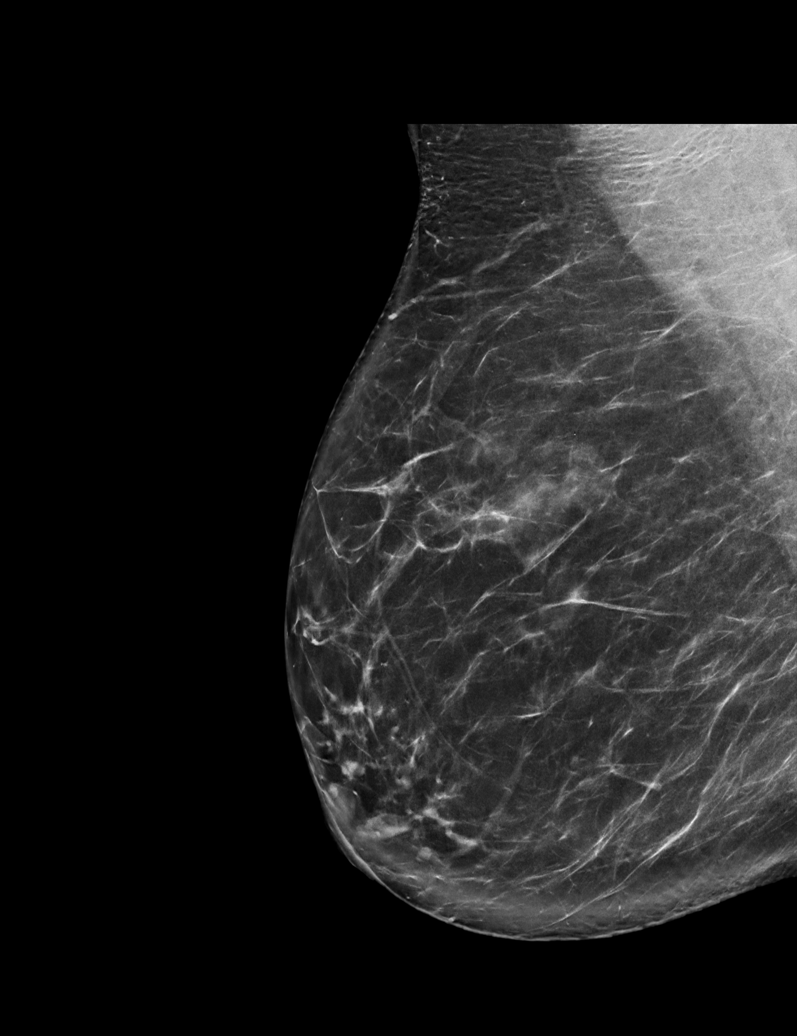

[L CC synth-2D]
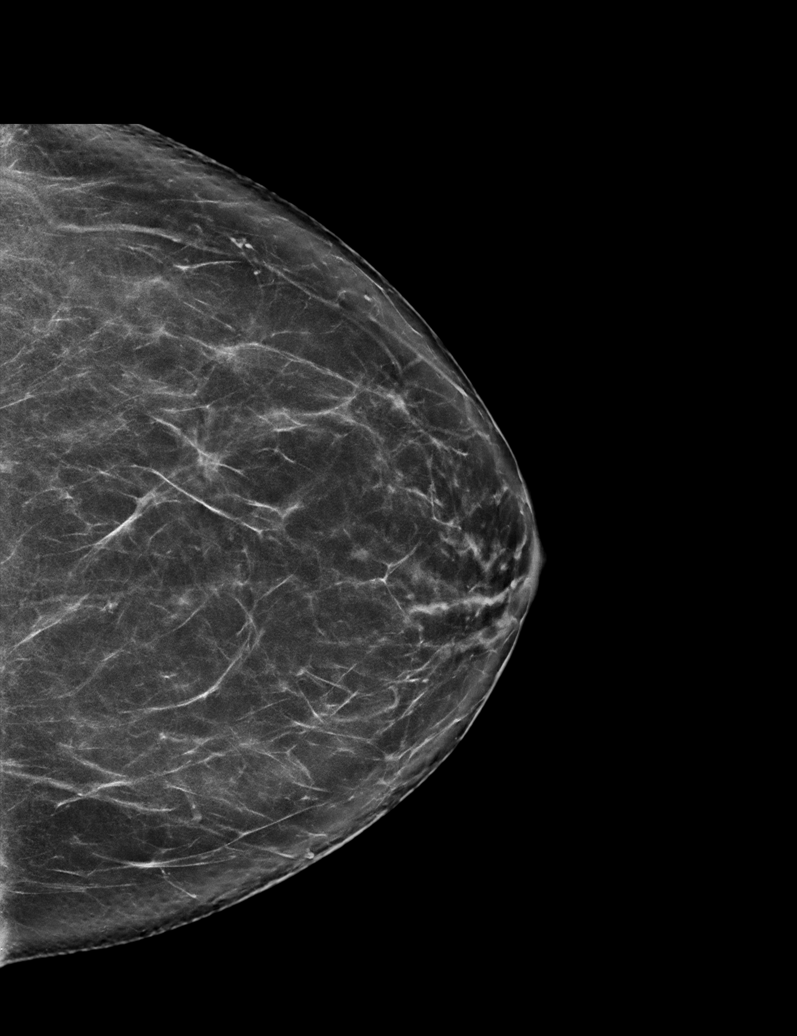

[L MLO tomo · tomo slice 41/82.0]
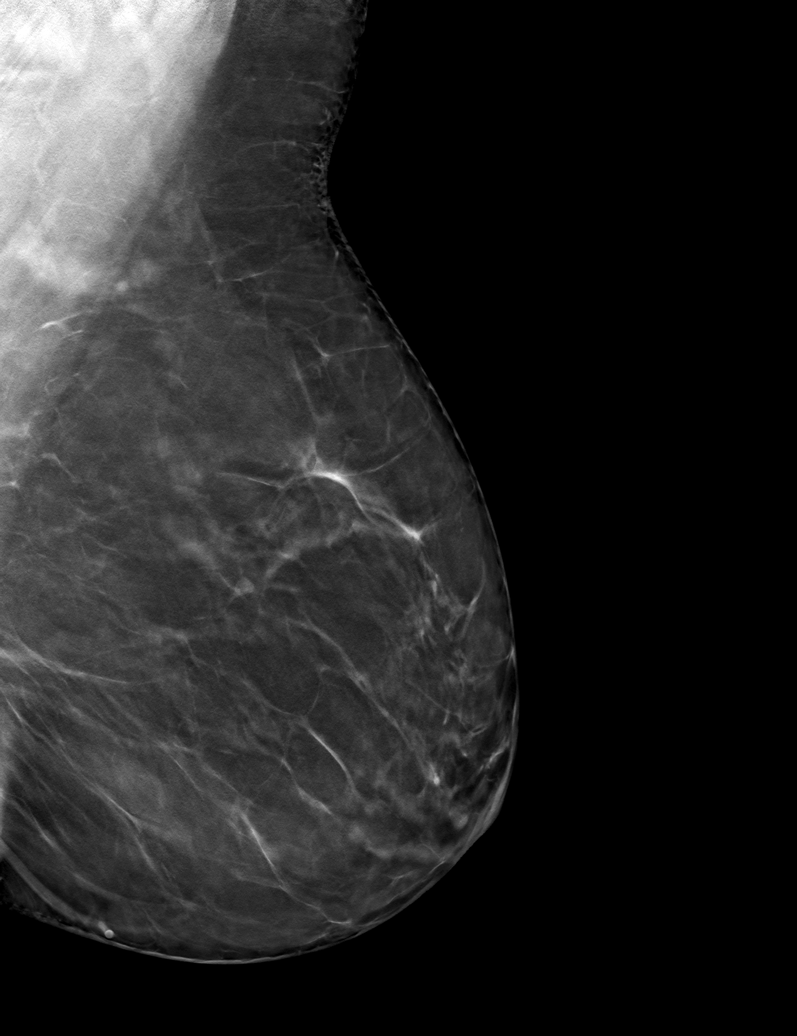

[6 of 30 positions shown; findings below may reference images not displayed]

ACR Breast Density Category b: There are scattered areas of
fibroglandular density.
FINDINGS: Tomosynthesis and synthesized full field CC and MLO views of both
breasts and a tomosynthesis and synthesized full field laterally
exaggerated CC view of the LEFT breast were obtained.

The focal asymmetry in the UPPER LEFT breast at POSTERIOR depth,
interspersed with fat, is unchanged since the September 2018
diagnostic mammogram and was vaguely visible on mammograms dating
back to 8980. No new or suspicious findings elsewhere in the LEFT
breast.

No findings suspicious for malignancy in the RIGHT breast.

Mammographic images were processed with CAD.
IMPRESSION: 1. Stable likely benign focal asymmetry in the UPPER LEFT breast at
far POSTERIOR depth.
2. No mammographic evidence of malignancy involving the RIGHT
breast.

RECOMMENDATION:
BILATERAL diagnostic mammography in 1 year to confirm 2 years of
stability of the likely benign LEFT breast asymmetry.

I have discussed the findings and recommendations with the patient.
If applicable, a reminder letter will be sent to the patient
regarding the next appointment.

BI-RADS CATEGORY  3: Probably benign.

## 2020-12-11 ENCOUNTER — Other Ambulatory Visit (HOSPITAL_COMMUNITY): Payer: Self-pay

## 2020-12-25 ENCOUNTER — Other Ambulatory Visit (HOSPITAL_COMMUNITY): Payer: Self-pay

## 2020-12-25 DIAGNOSIS — Z6832 Body mass index (BMI) 32.0-32.9, adult: Secondary | ICD-10-CM | POA: Diagnosis not present

## 2020-12-25 DIAGNOSIS — Z01419 Encounter for gynecological examination (general) (routine) without abnormal findings: Secondary | ICD-10-CM | POA: Diagnosis not present

## 2020-12-25 DIAGNOSIS — M1990 Unspecified osteoarthritis, unspecified site: Secondary | ICD-10-CM | POA: Diagnosis not present

## 2020-12-25 DIAGNOSIS — Z760 Encounter for issue of repeat prescription: Secondary | ICD-10-CM | POA: Diagnosis not present

## 2020-12-25 DIAGNOSIS — Z79899 Other long term (current) drug therapy: Secondary | ICD-10-CM | POA: Diagnosis not present

## 2020-12-25 MED ORDER — ZOLPIDEM TARTRATE 10 MG PO TABS
10.0000 mg | ORAL_TABLET | Freq: Every day | ORAL | 3 refills | Status: DC
  Filled 2020-12-25 – 2021-01-24 (×2): qty 30, 30d supply, fill #0
  Filled 2021-04-11: qty 30, 30d supply, fill #1
  Filled 2021-06-20: qty 30, 30d supply, fill #2

## 2020-12-28 DIAGNOSIS — M791 Myalgia, unspecified site: Secondary | ICD-10-CM | POA: Diagnosis not present

## 2020-12-28 DIAGNOSIS — M79641 Pain in right hand: Secondary | ICD-10-CM | POA: Diagnosis not present

## 2020-12-28 DIAGNOSIS — M79672 Pain in left foot: Secondary | ICD-10-CM | POA: Diagnosis not present

## 2020-12-28 DIAGNOSIS — R269 Unspecified abnormalities of gait and mobility: Secondary | ICD-10-CM | POA: Diagnosis not present

## 2020-12-28 DIAGNOSIS — M25511 Pain in right shoulder: Secondary | ICD-10-CM | POA: Diagnosis not present

## 2020-12-28 DIAGNOSIS — M545 Low back pain, unspecified: Secondary | ICD-10-CM | POA: Diagnosis not present

## 2021-01-02 ENCOUNTER — Other Ambulatory Visit (HOSPITAL_COMMUNITY): Payer: Self-pay

## 2021-01-15 ENCOUNTER — Other Ambulatory Visit (HOSPITAL_COMMUNITY): Payer: Self-pay

## 2021-01-15 DIAGNOSIS — R1032 Left lower quadrant pain: Secondary | ICD-10-CM | POA: Diagnosis not present

## 2021-01-15 MED ORDER — CYCLOBENZAPRINE HCL 5 MG PO TABS
ORAL_TABLET | ORAL | 0 refills | Status: DC
  Filled 2021-01-15: qty 30, 10d supply, fill #0

## 2021-01-17 ENCOUNTER — Telehealth: Payer: Self-pay | Admitting: Gastroenterology

## 2021-01-17 DIAGNOSIS — R1032 Left lower quadrant pain: Secondary | ICD-10-CM

## 2021-01-17 NOTE — Telephone Encounter (Signed)
Patient called states she been having a lot of abdominal pain and requested to only see Dr. Ardis Hughs for an office visit. I advised her of his soonest available appointment which is in September and offered her an APP but she declined. She then asked how she can get in sooner I advised her of sending a triage message to nurse and they could possibly reschedule if it was needed and she agreed but felt I was giving her a hard time. Please call patient per her request.

## 2021-01-17 NOTE — Telephone Encounter (Signed)
I spoke with the pt and she was adamant that she see Dr Ardis Hughs and refused to see an APP.  She tells me that she wants to see someone "smarter that her"  she is a Music therapist.  She said I can't believe you can't talk to Dr Ardis Hughs and get him to see me tomorrow.  I explained that Dr Ardis Hughs is out of the office until next week.  She took an appt for Sept with Dr Ardis Hughs and said I will talk to him when I see him at the hospital. FYI Dr Ardis Hughs

## 2021-01-21 NOTE — Telephone Encounter (Signed)
Patient advised that contrast has been left at front desk for pick up.  Patient agreed to plan and verbalized understanding.  No further questions.

## 2021-01-21 NOTE — Telephone Encounter (Signed)
Left message for patient to return call.  She is scheduled for CT scan at [facility] on 6/23 @ 1. Patient should arrive at 12:45pm. No solids 4 hours prior. Encourage patient to drink lots of fluids up until the scan and to drink fluids while she drinks her oral contrast. No underwire bra unless she can take it off for the scan. She needs to drink at contrast at 11 am and 12pm.  Will continue efforts.

## 2021-01-21 NOTE — Telephone Encounter (Signed)
Patient returned the call I was able to provide her with the recommendations below however the patient asked where she would be getting the contrast from please advise.

## 2021-01-21 NOTE — Telephone Encounter (Signed)
I spoke with her this morning about her abdominal pains.  They were pretty severe last week but have waned a bit since then.  I think she needs imaging as first work-up test for LLQ pains.  She needs a CT scan abdomen pelvis with IV and oral contrast.  This is not to be done without IV contrast.  Thanks she is expecting a call sometime today about this.

## 2021-01-21 NOTE — Addendum Note (Signed)
Addended by: Stevan Born on: 01/21/2021 11:27 AM   Modules accepted: Orders

## 2021-01-21 NOTE — Addendum Note (Signed)
Addended by: Stevan Born on: 01/21/2021 10:57 AM   Modules accepted: Orders

## 2021-01-22 NOTE — Telephone Encounter (Signed)
Left message on voicemail with instructions prior to CT scan.  Advised on voicemail that the contrast that she picked up from [facility] is the correct contrast.  Advised patient to return call with any further questions or concerns that she may have.

## 2021-01-22 NOTE — Telephone Encounter (Signed)
Pt called stating that she picked up contrast at the [facility] because she is working a long shift today so she will not be able to come to the office to pick up the other one that you left for her. She wants to know if she got the correct contrast, she got two bottles of E-Z cat contrast. She also wants to make sure that she got the correct instructions. It is ok to leave a vm on her cell since she might not be available to answer her phone.

## 2021-01-23 ENCOUNTER — Ambulatory Visit (INDEPENDENT_AMBULATORY_CARE_PROVIDER_SITE_OTHER): Payer: 59 | Admitting: Internal Medicine

## 2021-01-24 ENCOUNTER — Other Ambulatory Visit: Payer: Self-pay

## 2021-01-24 ENCOUNTER — Ambulatory Visit (INDEPENDENT_AMBULATORY_CARE_PROVIDER_SITE_OTHER)
Admission: RE | Admit: 2021-01-24 | Discharge: 2021-01-24 | Disposition: A | Discharge status: Home / Self Care | Payer: 59 | Source: Ambulatory Visit | Attending: Gastroenterology | Admitting: Gastroenterology

## 2021-01-24 ENCOUNTER — Other Ambulatory Visit (HOSPITAL_COMMUNITY): Payer: Self-pay

## 2021-01-24 DIAGNOSIS — R1032 Left lower quadrant pain: Secondary | ICD-10-CM

## 2021-01-24 DIAGNOSIS — R109 Unspecified abdominal pain: Secondary | ICD-10-CM | POA: Diagnosis not present

## 2021-01-24 MED ORDER — IOHEXOL 300 MG/ML  SOLN
100.0000 mL | Freq: Once | INTRAMUSCULAR | Status: AC | PRN
  Administered 2021-01-24: 100 mL via INTRAVENOUS

## 2021-01-25 ENCOUNTER — Other Ambulatory Visit (HOSPITAL_COMMUNITY): Payer: Self-pay

## 2021-01-25 ENCOUNTER — Other Ambulatory Visit: Payer: Self-pay

## 2021-01-25 MED ORDER — CIPROFLOXACIN HCL 500 MG PO TABS
500.0000 mg | ORAL_TABLET | Freq: Two times a day (BID) | ORAL | 0 refills | Status: DC
  Filled 2021-01-25: qty 20, 10d supply, fill #0

## 2021-01-25 MED ORDER — METRONIDAZOLE 500 MG PO TABS
500.0000 mg | ORAL_TABLET | Freq: Two times a day (BID) | ORAL | 0 refills | Status: DC
  Filled 2021-01-25: qty 20, 10d supply, fill #0

## 2021-02-17 ENCOUNTER — Encounter (HOSPITAL_BASED_OUTPATIENT_CLINIC_OR_DEPARTMENT_OTHER): Payer: Self-pay | Admitting: Emergency Medicine

## 2021-02-17 ENCOUNTER — Emergency Department (HOSPITAL_COMMUNITY): Payer: 59

## 2021-02-17 ENCOUNTER — Emergency Department (HOSPITAL_BASED_OUTPATIENT_CLINIC_OR_DEPARTMENT_OTHER)
Admission: EM | Admit: 2021-02-17 | Discharge: 2021-02-17 | Disposition: A | Discharge status: Home / Self Care | Payer: 59 | Attending: Emergency Medicine | Admitting: Emergency Medicine

## 2021-02-17 ENCOUNTER — Other Ambulatory Visit: Payer: Self-pay

## 2021-02-17 ENCOUNTER — Emergency Department (HOSPITAL_BASED_OUTPATIENT_CLINIC_OR_DEPARTMENT_OTHER): Payer: 59

## 2021-02-17 DIAGNOSIS — R519 Headache, unspecified: Secondary | ICD-10-CM | POA: Insufficient documentation

## 2021-02-17 DIAGNOSIS — I639 Cerebral infarction, unspecified: Secondary | ICD-10-CM | POA: Insufficient documentation

## 2021-02-17 DIAGNOSIS — R42 Dizziness and giddiness: Secondary | ICD-10-CM | POA: Diagnosis not present

## 2021-02-17 HISTORY — DX: Herpesviral infection, unspecified: B00.9

## 2021-02-17 HISTORY — DX: Rheumatoid arthritis, unspecified: M06.9

## 2021-02-17 LAB — CBC WITH DIFFERENTIAL/PLATELET
Abs Immature Granulocytes: 0.02 10*3/uL (ref 0.00–0.07)
Basophils Absolute: 0 10*3/uL (ref 0.0–0.1)
Basophils Relative: 1 %
Eosinophils Absolute: 0.1 10*3/uL (ref 0.0–0.5)
Eosinophils Relative: 1 %
HCT: 43.5 % (ref 36.0–46.0)
Hemoglobin: 14.7 g/dL (ref 12.0–15.0)
Immature Granulocytes: 0 %
Lymphocytes Relative: 20 %
Lymphs Abs: 1.2 10*3/uL (ref 0.7–4.0)
MCH: 30.1 pg (ref 26.0–34.0)
MCHC: 33.8 g/dL (ref 30.0–36.0)
MCV: 89.1 fL (ref 80.0–100.0)
Monocytes Absolute: 0.4 10*3/uL (ref 0.1–1.0)
Monocytes Relative: 7 %
Neutro Abs: 4.3 10*3/uL (ref 1.7–7.7)
Neutrophils Relative %: 71 %
Platelets: 241 10*3/uL (ref 150–400)
RBC: 4.88 MIL/uL (ref 3.87–5.11)
RDW: 13.3 % (ref 11.5–15.5)
WBC: 6 10*3/uL (ref 4.0–10.5)
nRBC: 0 % (ref 0.0–0.2)

## 2021-02-17 LAB — BASIC METABOLIC PANEL
Anion gap: 7 (ref 5–15)
BUN: 14 mg/dL (ref 6–20)
CO2: 25 mmol/L (ref 22–32)
Calcium: 9.1 mg/dL (ref 8.9–10.3)
Chloride: 105 mmol/L (ref 98–111)
Creatinine, Ser: 0.69 mg/dL (ref 0.44–1.00)
GFR, Estimated: >60 mL/min (ref >60)
Glucose, Bld: 105 mg/dL — ABNORMAL HIGH (ref 70–99)
Potassium: 4 mmol/L (ref 3.5–5.1)
Sodium: 137 mmol/L (ref 135–145)

## 2021-02-17 LAB — PROTIME-INR
INR: 0.9 (ref 0.8–1.2)
Prothrombin Time: 12.6 seconds (ref 11.4–15.2)

## 2021-02-17 LAB — APTT: aPTT: 26 seconds (ref 24–36)

## 2021-02-17 MED ORDER — ONDANSETRON HCL 4 MG/2ML IJ SOLN: 4.0000 mg | Freq: Once | INTRAMUSCULAR | Status: DC

## 2021-02-17 MED ORDER — DIPHENHYDRAMINE HCL 50 MG/ML IJ SOLN
12.5000 mg | Freq: Once | INTRAMUSCULAR | Status: AC
  Administered 2021-02-17: 12.5 mg via INTRAVENOUS
  Filled 2021-02-17: qty 1

## 2021-02-17 MED ORDER — DEXAMETHASONE SODIUM PHOSPHATE 10 MG/ML IJ SOLN
10.0000 mg | Freq: Once | INTRAMUSCULAR | Status: AC
  Administered 2021-02-17: 10 mg via INTRAVENOUS
  Filled 2021-02-17: qty 1

## 2021-02-17 MED ORDER — MECLIZINE HCL 25 MG PO TABS
25.0000 mg | ORAL_TABLET | Freq: Once | ORAL | Status: AC
  Administered 2021-02-17: 25 mg via ORAL
  Filled 2021-02-17: qty 1

## 2021-02-17 MED ORDER — METOCLOPRAMIDE HCL 5 MG/ML IJ SOLN
5.0000 mg | Freq: Once | INTRAMUSCULAR | Status: DC
  Filled 2021-02-17: qty 2

## 2021-02-17 MED ORDER — ONDANSETRON HCL 4 MG/2ML IJ SOLN
4.0000 mg | Freq: Once | INTRAMUSCULAR | Status: AC
  Administered 2021-02-17: 4 mg via INTRAVENOUS
  Filled 2021-02-17: qty 2

## 2021-02-17 MED ORDER — MECLIZINE HCL 25 MG PO TABS
25.0000 mg | ORAL_TABLET | Freq: Three times a day (TID) | ORAL | 0 refills | Status: AC | PRN
  Filled 2021-02-18: qty 30, 10d supply, fill #0

## 2021-02-17 MED ORDER — SODIUM CHLORIDE 0.9 % IV BOLUS
500.0000 mL | Freq: Once | INTRAVENOUS | Status: AC
Start: 2021-02-17 — End: 2021-02-17
  Administered 2021-02-17: 500 mL via INTRAVENOUS

## 2021-02-17 MED ORDER — LORAZEPAM 2 MG/ML IJ SOLN
0.5000 mg | Freq: Once | INTRAMUSCULAR | Status: DC
  Filled 2021-02-17: qty 1

## 2021-02-17 MED ORDER — KETOROLAC TROMETHAMINE 30 MG/ML IJ SOLN
30.0000 mg | Freq: Once | INTRAMUSCULAR | Status: AC
Start: 2021-02-17 — End: 2021-02-17
  Administered 2021-02-17: 30 mg via INTRAVENOUS
  Filled 2021-02-17: qty 1

## 2021-02-17 NOTE — ED Triage Notes (Signed)
Pt from [facility] for MRI. Headache and balance issues since last night.

## 2021-02-17 NOTE — ED Notes (Signed)
Pt amb to BR w/ minimal assist

## 2021-02-17 NOTE — Discharge Instructions (Addendum)
You were seen and evaluated in the emergency department for dizziness.  Your work-up included a CT scan of the head and MRI of the brain, as well as blood tests.  The work-up did not show signs of stroke in the brain, nor signs infection, dehydration, or other medical emergencies.  I felt your symptoms were most likely peripheral vertigo - which has several causes - and advised that we start you on a medicine called meclizine and have you follow up with ENT.  We discussed some of your concerns at the bedside.  Specifically we discussed the possibility of viral meningitis.  I felt this was less likely the case with your presentation, but explained it was impossible to rule out fully without more testing.  This would involve a spinal tap or lumbar puncture.  You told us that you were unhappy with your care, and that you would not be staying for this testing.    If your symptoms worsen, particularly if you begin having confusion, severe nausea and vomiting, numbness or weakness of your arms or legs, or severe worsening of your headache, please return to the Emergency Department.

## 2021-02-17 NOTE — ED Notes (Signed)
Pt back from MRI 

## 2021-02-17 NOTE — ED Notes (Signed)
RN attempted to give medications at [facility] however pt became angry and demanded to speak to PA and her supervising physician.  "How dare you try to sedate me."  PA notified, bedside.  Physician now bedside.

## 2021-02-17 NOTE — ED Triage Notes (Signed)
Pt reports started with headache in middle of night into yesterday morning that lasted all day. Today patient reports headache worse, having dizziness, nausea and balance issues.

## 2021-02-17 NOTE — ED Notes (Signed)
Patient transported to CT 

## 2021-02-17 NOTE — ED Provider Notes (Signed)
55 y.o. F transferred from [facility] for evaluation of feeling off balance, headache that has been ongoing for last 2 days.  She states she has not been able to walk without feeling off balance which is abnormal for her.  She feels like she has also been having some word finding difficulty but no slurred speech, facial droop.  No numbness/weakness of arms or legs.  Please see note from previous provider for full history/physical exam.   Physical Exam  BP 132/88   Pulse 71   Temp 98.5 F (36.9 C) (Oral)   Resp 18   Ht 5\' 4"  (1.626 m)   Wt 81.6 kg   SpO2 97%   BMI 30.90 kg/m   Physical Exam  Cranial nerves III-XII intact Follows commands, Moves all extremities  5/5 strength to BUE and BLE  Sensation intact throughout all major nerve distributions No pronator drift. No slurred speech. Able to speak in full sentences without any difficulty. No facial droop. Face is symmetric in appearance.  Gait ataxia noted.   ED Course/Procedures     Procedures  Results for orders placed or performed during the hospital encounter of 02/17/21 (from the past 24 hour(s))  CBC with Differential     Status: None   Collection Time: 02/17/21  1:35 PM  Result Value Ref Range   WBC 6.0 4.0 - 10.5 K/uL   RBC 4.88 3.87 - 5.11 MIL/uL   Hemoglobin 14.7 12.0 - 15.0 g/dL   HCT 43.5 36.0 - 46.0 %   MCV 89.1 80.0 - 100.0 fL   MCH 30.1 26.0 - 34.0 pg   MCHC 33.8 30.0 - 36.0 g/dL   RDW 13.3 11.5 - 15.5 %   Platelets 241 150 - 400 K/uL   nRBC 0.0 0.0 - 0.2 %   Neutrophils Relative % 71 %   Neutro Abs 4.3 1.7 - 7.7 K/uL   Lymphocytes Relative 20 %   Lymphs Abs 1.2 0.7 - 4.0 K/uL   Monocytes Relative 7 %   Monocytes Absolute 0.4 0.1 - 1.0 K/uL   Eosinophils Relative 1 %   Eosinophils Absolute 0.1 0.0 - 0.5 K/uL   Basophils Relative 1 %   Basophils Absolute 0.0 0.0 - 0.1 K/uL   Immature Granulocytes 0 %   Abs Immature Granulocytes 0.02 0.00 - 0.07 K/uL  Basic metabolic panel     Status:  Abnormal   Collection Time: 02/17/21  1:35 PM  Result Value Ref Range   Sodium 137 135 - 145 mmol/L   Potassium 4.0 3.5 - 5.1 mmol/L   Chloride 105 98 - 111 mmol/L   CO2 25 22 - 32 mmol/L   Glucose, Bld 105 (H) 70 - 99 mg/dL   BUN 14 6 - 20 mg/dL   Creatinine, Ser 0.69 0.44 - 1.00 mg/dL   Calcium 9.1 8.9 - 10.3 mg/dL   GFR, Estimated >60 >60 mL/min   Anion gap 7 5 - 15  Protime-INR     Status: None   Collection Time: 02/17/21  1:35 PM  Result Value Ref Range   Prothrombin Time 12.6 11.4 - 15.2 seconds   INR 0.9 0.8 - 1.2  APTT     Status: None   Collection Time: 02/17/21  1:35 PM  Result Value Ref Range   aPTT 26 24 - 36 seconds    MDM   CT head unremarkable.  CBC shows no leukocytosis or anemia. BMP shows normal BUN/Cr. Patient transferred to the ER for further  evaluation.  Will obtain MRI brain to rule out stroke. Updated patient on plan.   Patient discussed with me that she has a history of autoimmune hashimoto's and has recent been diagnosed with RA. She reports she gets herpes out break on her face routinely and she usually takes a dose of Valtrex and it gets better.  She has noticed over the last year or so, that her outbreaks require more Valtrex.  When I did evaluate her cranial nerves, I did not see any obvious outbreaks to her face.  Face was symmetric in appearance without any overlying warmth, erythema, rash noted.  MRI shows no evidence acute stroke. No acute abnormalities.   I personally ambulated patient in the ED. She did exhibit some gait ataxia and seemed to sway to the right. She states this is not normal for her. She states she mostly feels the symptoms when she walks but does feel like they sometimes occur when she turns her head or when she turns on her side in the bed. Will consult neuro for any other further recommendations.   Discussed patient with Dr. Cheral Marker (neuro). He reviewed her MRI and does not see a specific central cause to her symptoms.  He feels  this may more likely represent a peripheral vertigo. No further imaging required here in the ED. Recommends outpatient ENT.   Discussed patient with Dr. Langston Masker. We will try to treat with meclizine and ativan to see if that helps her symptoms.  If she gets better with those medications, this may be more indication that this is peripheral vertigo.  RN informed me that patient did not want to take the meds. I discussed with patient and she did not wish to take the ativan as she was worried it was going to sedate her.  I discussed with patient that she did not have to take the Ativan if she is not comfortable with it and that its purpose was not to sedate her but to see if it helps with symptom control.  Patient did not wish to take it and the order was canceled.  I discussed with her regarding my discussion with neurology and their recommendations. At this time, patient requested to discuss with the supervising attending. At this point in time, care of patient was transferred to Dr. Langston Masker.   Portions of this note were generated with Lobbyist. Dictation errors may occur despite best attempts at proofreading.  1. Dizziness   2. Vertigo          Desma Mcgregor 02/17/21 2329    Wyvonnia Dusky, MD 02/18/21 [phone]

## 2021-02-17 NOTE — ED Provider Notes (Signed)
[facility] EMERGENCY DEPARTMENT Provider Note   CSN: [id number] Arrival date & time: 02/17/21  1138     History Chief Complaint  Patient presents with   Dizziness    Robin Maynard is a 55 y.o. female.  HPI  55 year old female with past medical history of rheumatoid arthritis presents emergency department concern for dizziness and headache.  Patient states yesterday morning she woke up feeling off balance.  Later in the day she developed a headache that extends from her eyes to the posterior part of her head.  She states that this headache has been persistent.  She was able to go about her day yesterday however had a difficult time sleeping overnight because of the headache and today feels completely off balance, unable to ambulate without assistance.  She describes the dizziness as an off-balance feeling, consistent, waxing and waning in severity, not worse with movement.  Patient has remote history of migraines but never any presentation like this.  The headache was gradual and not thunderclap.  No associated neck pain at this time.  She does feel like she is having some difficulty with word finding but denies any dysarthria.  Denies any vision change, vision loss, facial droop, focal numbness or weakness.  Past Medical History:  Diagnosis Date   Acute pancreatitis    Constipation    Gastritis    GERD (gastroesophageal reflux disease)    GI work up in progress   Herpes    Mass on back    Obesity    PONV (postoperative nausea and vomiting)    Rheumatoid arthritis [facility])     Patient Active Problem List   Diagnosis Date Noted   Closed fracture of right clavicle 04/30/2019   Abdominal pain, epigastric 09/30/2016   Acute pancreatitis 09/27/2016   Constipation 09/27/2016   Overweight (BMI 25.0-29.9) 09/27/2016   Mass of right lower leg 08/11/2013   Mass on back 08/11/2013    Past Surgical History:  Procedure Laterality Date   DILITATION & CURRETTAGE/HYSTROSCOPY  WITH HYDROTHERMAL ABLATION N/A 12/26/2016   Procedure: DILATATION & CURETTAGE/HYSTEROSCOPY WITH HYDROTHERMAL ABLATION;  Surgeon: Dian Queen, MD;  Location: [facility];  Service: Gynecology;  Laterality: N/A;   MASS EXCISION Right 02/23/2018   Procedure: EXCISION BACK MASS ERAS PATHWAY;  Surgeon: Rolm Bookbinder, MD;  Location: [facility];  Service: General;  Laterality: Right;   TONSILLECTOMY AND ADENOIDECTOMY     TYMPANOSTOMY TUBE PLACEMENT       OB History   No obstetric history on file.     Family History  Problem Relation Age of Onset   Gastric cancer Father    Diabetes Father    Heart disease Father    Hypertension Father    Stomach cancer Father    Deep vein thrombosis Mother    Colon polyps Mother    Heart disease Mother    Diabetes Paternal Grandmother    Heart disease Paternal Grandmother    Breast cancer Maternal Aunt    Aortic stenosis Son    Colon cancer Neg Hx    Allergic rhinitis Neg Hx    Asthma Neg Hx    Angioedema Neg Hx    Eczema Neg Hx    Urticaria Neg Hx    Rectal cancer Neg Hx    Esophageal cancer Neg Hx     Social History   Tobacco Use   Smoking status: Never   Smokeless tobacco: Never  Vaping Use   Vaping Use: Never  used  Substance Use Topics   Alcohol use: No   Drug use: No    Home Medications Prior to Admission medications   Medication Sig Start Date End Date Taking? Authorizing Provider  ciprofloxacin (CIPRO) 500 MG tablet Take 1 tablet (500 mg total) by mouth 2 (two) times daily. 01/25/21   Milus Banister, MD  cyclobenzaprine (FLEXERIL) 5 MG tablet Take 1 tablet 3 times a day by oral route. 01/15/21     diclofenac Sodium (VOLTAREN) 1 % GEL APPLY TO LEFT BIG TOE - 4 GMS TWICE DAILY 06/18/20 06/18/21  Mercie Eon, MD  esomeprazole (NEXIUM) 40 MG capsule TAKE 1 CAPSULE (40 MG TOTAL) BY MOUTH DAILY. 06/18/20 06/18/21  Mercie Eon, MD  folic acid (FOLVITE) 1 MG tablet TAKE 1 TABLET BY MOUTH DAILY  08/16/20 08/16/21  Mercie Eon, MD  hydroxychloroquine (PLAQUENIL) 200 MG tablet TAKE 1 TABLET BY MOUTH DAILY 05/28/20 05/28/21  Mercie Eon, MD  meloxicam (MOBIC) 15 MG tablet TAKE 1 TABLET BY MOUTH DAILY AS NEEDED FOR PAIN. 04/26/20 04/26/21  Mercie Eon, MD  methotrexate 2.5 MG tablet Take 3 tablets (7.5mg ) by mouth once a week 12/06/20     metroNIDAZOLE (FLAGYL) 500 MG tablet Take 1 tablet (500 mg total) by mouth 2 (two) times daily. 01/25/21   Milus Banister, MD  tiZANidine (ZANAFLEX) 2 MG tablet TAKE 1 TABLET BY MOUTH EVERY 12 HOURS AS NEEDED 08/16/20 08/16/21  Mercie Eon, MD  valACYclovir (VALTREX) 500 MG tablet Take 500 mg by mouth daily as needed (breakout).     [provider]  valACYclovir (VALTREX) 500 MG tablet TAKE 1 TABLET BY MOUTH DAILY FOR PROPHYLAXIS, MAY TAKE 2 TABS 3 TIMES A DAY AS NEEDED FOR BREAKTHROUGH OCCURRENCE 07/12/20 07/12/21  Caren Macadam, MD  zolpidem (AMBIEN) 10 MG tablet at bedtime as needed.  04/07/18   [provider]  zolpidem (AMBIEN) 10 MG tablet TAKE 1 TABLET BY MOUTH EVERY NIGHT AT BEDTIME 02/16/20 08/14/20  Dian Queen, MD  zolpidem (AMBIEN) 10 MG tablet TAKE 1 TABLET BY MOUTH EVERY NIGHT AT BEDTIME 12/25/20       Allergies    Doxycycline  Review of Systems   Review of Systems  Constitutional:  Negative for chills and fever.  HENT:  Negative for congestion.   Eyes:  Negative for visual disturbance.  Respiratory:  Negative for shortness of breath.   Cardiovascular:  Negative for chest pain.  Gastrointestinal:  Positive for nausea. Negative for abdominal pain, diarrhea and vomiting.  Genitourinary:  Negative for dysuria.  Musculoskeletal:  Negative for back pain and neck pain.  Skin:  Negative for rash.  Neurological:  Positive for dizziness, speech difficulty, light-headedness and headaches. Negative for facial asymmetry, weakness and numbness.   Physical Exam Updated Vital Signs BP (!) 149/89 (BP Location: Right Arm)   Pulse  70   Temp 97.8 F (36.6 C) (Oral)   Resp 18   Ht 5\' 4"  (1.626 m)   Wt 81.6 kg   SpO2 98%   BMI 30.90 kg/m   Physical Exam Vitals and nursing note reviewed.  Constitutional:      Appearance: Normal appearance.  HENT:     Head: Normocephalic.     Mouth/Throat:     Mouth: Mucous membranes are moist.  Eyes:     Extraocular Movements: Extraocular movements intact.     Pupils: Pupils are equal, round, and reactive to light.     Comments: Bidirectional nystagmus with  a component of rotatory nystagmus when looking to the right  Cardiovascular:     Rate and Rhythm: Normal rate.  Pulmonary:     Effort: Pulmonary effort is normal. No respiratory distress.  Abdominal:     Palpations: Abdomen is soft.     Tenderness: There is no abdominal tenderness.  Musculoskeletal:     Cervical back: No tenderness.  Skin:    General: Skin is warm.  Neurological:     Mental Status: She is alert and oriented to person, place, and time.     Cranial Nerves: No cranial nerve deficit.     Motor: No weakness.     Gait: Gait abnormal.  Psychiatric:        Mood and Affect: Mood normal.    ED Results / Procedures / Treatments   Labs (all labs ordered are listed, but only abnormal results are displayed) Labs Reviewed  CBC WITH DIFFERENTIAL/PLATELET  BASIC METABOLIC PANEL  PROTIME-INR  APTT    EKG EKG Interpretation  Date/Time:  Sunday February 17 2021 12:07:45 EDT Ventricular Rate:  81 PR Interval:  156 QRS Duration: 78 QT Interval:  352 QTC Calculation: 408 R Axis:   27 Text Interpretation: Normal sinus rhythm Low voltage QRS Cannot rule out Anterior infarct , age undetermined Abnormal ECG NSR, no previous Confirmed by Adalynd Donahoe ([id number]) on 02/17/2021 1:28:34 PM  Radiology No results found.  Procedures Procedures   Medications Ordered in ED Medications - No data to display  ED Course  I have reviewed the triage vital signs and the nursing notes.  Pertinent labs & imaging results  that were available during my care of the patient were reviewed by me and considered in my medical decision making (see chart for details).    MDM Rules/Calculators/A&P                          54  year old female presents emergency department dizziness and headache.  Since the onset of symptoms they have been persistent, becoming more severe.  She has horizontal nystagmus with a rotational aspect on the right, no ataxia and she is otherwise neurologically intact but she does have an unsteady gait and requires assistance due to feeling off balance.  Head CT shows no acute finding, blood work is reassuring.  Given her presentation I have a strong concern for posterior circulation stroke.  This seems atypical for vertigo.  I will treat her headache and symptoms otherwise and she will be transferred to [facility] for MRI of the brain.  Dr. Vanita Panda is accepting.  Final Clinical Impression(s) / ED Diagnoses Final diagnoses:  None    Rx / DC Orders ED Discharge Orders     None        Lorelle Gibbs, DO 02/17/21 1523

## 2021-02-18 ENCOUNTER — Other Ambulatory Visit (HOSPITAL_COMMUNITY): Payer: Self-pay

## 2021-02-18 NOTE — ED Provider Notes (Signed)
[facility]ERGENCY DEPARTMENT Provider Note   CSN: [id number] Arrival date & time: 02/17/21  1138     History Chief Complaint  Patient presents with   Dizziness    Robin Maynard is a 55 y.o. female w/ hx of HSV, rheumatoid arthirits, presenting as a transfer from [facility] center for MRI imaging in setting of headache and dizziness.  The patient reports onset of frontal headache and balance difficulty, with vertigo symptoms for the past 2 days.  She reports intermittent nausea  as well.  She has not had these symptoms before.  She has a history of migraines.  She denied neck pain.  Denied focal weakness or numbness or vision loss.  The patient was seen initially a [facility].  There she was afebrile on presentation, with normal HR.  She received IV medications for her headache including decadron, toradol, benadryl and zofran.  Her CT head was unremarkable.  She was transferred to [facility] for MRI of the brain to evaluate for possible posterior CVA.  Her labs included CBC, BMP, INR, which were all within normal limits.  She reports no family history of MS.  Her rheumatoid disease has been managed with methotrexate in the past.  HPI     Past Medical History:  Diagnosis Date   Acute pancreatitis    Constipation    Gastritis    GERD (gastroesophageal reflux disease)    GI work up in progress   Herpes    Mass on back    Obesity    PONV (postoperative nausea and vomiting)    Rheumatoid arthritis [facility])     Patient Active Problem List   Diagnosis Date Noted   Closed fracture of right clavicle 04/30/2019   Abdominal pain, epigastric 09/30/2016   Acute pancreatitis 09/27/2016   Constipation 09/27/2016   Overweight (BMI 25.0-29.9) 09/27/2016   Mass of right lower leg 08/11/2013   Mass on back 08/11/2013    Past Surgical History:  Procedure Laterality Date   DILITATION & CURRETTAGE/HYSTROSCOPY WITH HYDROTHERMAL ABLATION N/A  12/26/2016   Procedure: DILATATION & CURETTAGE/HYSTEROSCOPY WITH HYDROTHERMAL ABLATION;  Surgeon: Dian Queen, MD;  Location: [place];  Service: Gynecology;  Laterality: N/A;   MASS EXCISION Right 02/23/2018   Procedure: EXCISION BACK MASS ERAS PATHWAY;  Surgeon: Rolm Bookbinder, MD;  Location: [place];  Service: General;  Laterality: Right;   TONSILLECTOMY AND ADENOIDECTOMY     TYMPANOSTOMY TUBE PLACEMENT       OB History   No obstetric history on file.     Family History  Problem Relation Age of Onset   Gastric cancer Father    Diabetes Father    Heart disease Father    Hypertension Father    Stomach cancer Father    Deep vein thrombosis Mother    Colon polyps Mother    Heart disease Mother    Diabetes Paternal Grandmother    Heart disease Paternal Grandmother    Breast cancer Maternal Aunt    Aortic stenosis Son    Colon cancer Neg Hx    Allergic rhinitis Neg Hx    Asthma Neg Hx    Angioedema Neg Hx    Eczema Neg Hx    Urticaria Neg Hx    Rectal cancer Neg Hx    Esophageal cancer Neg Hx     Social History   Tobacco Use   Smoking status: Never   Smokeless tobacco: Never  Vaping Use   Vaping Use: Never used  Substance Use Topics   Alcohol use: No   Drug use: No    Home Medications Prior to Admission medications   Medication Sig Start Date End Date Taking? Authorizing Provider  meclizine (ANTIVERT) 25 MG tablet Take 1 tablet (25 mg total) by mouth 3 (three) times daily as needed for dizziness. 02/17/21 03/19/21 Yes Imogine Carvell, Carola Rhine, MD  ciprofloxacin (CIPRO) 500 MG tablet Take 1 tablet (500 mg total) by mouth 2 (two) times daily. 01/25/21   Milus Banister, MD  cyclobenzaprine (FLEXERIL) 5 MG tablet Take 1 tablet 3 times a day by oral route. 01/15/21     diclofenac Sodium (VOLTAREN) 1 % GEL APPLY TO LEFT BIG TOE - 4 GMS TWICE DAILY 06/18/20 06/18/21  Mercie Eon, MD  esomeprazole (NEXIUM) 40 MG capsule TAKE 1 CAPSULE  (40 MG TOTAL) BY MOUTH DAILY. 06/18/20 06/18/21  Mercie Eon, MD  folic acid (FOLVITE) 1 MG tablet TAKE 1 TABLET BY MOUTH DAILY 08/16/20 08/16/21  Mercie Eon, MD  hydroxychloroquine (PLAQUENIL) 200 MG tablet TAKE 1 TABLET BY MOUTH DAILY 05/28/20 05/28/21  Mercie Eon, MD  meloxicam (MOBIC) 15 MG tablet TAKE 1 TABLET BY MOUTH DAILY AS NEEDED FOR PAIN. 04/26/20 04/26/21  Mercie Eon, MD  methotrexate 2.5 MG tablet Take 3 tablets (7.5mg ) by mouth once a week 12/06/20     metroNIDAZOLE (FLAGYL) 500 MG tablet Take 1 tablet (500 mg total) by mouth 2 (two) times daily. 01/25/21   Milus Banister, MD  tiZANidine (ZANAFLEX) 2 MG tablet TAKE 1 TABLET BY MOUTH EVERY 12 HOURS AS NEEDED 08/16/20 08/16/21  Mercie Eon, MD  valACYclovir (VALTREX) 500 MG tablet Take 500 mg by mouth daily as needed (breakout).     [provider]  valACYclovir (VALTREX) 500 MG tablet TAKE 1 TABLET BY MOUTH DAILY FOR PROPHYLAXIS, MAY TAKE 2 TABS 3 TIMES A DAY AS NEEDED FOR BREAKTHROUGH OCCURRENCE 07/12/20 07/12/21  Caren Macadam, MD  zolpidem (AMBIEN) 10 MG tablet at bedtime as needed.  04/07/18   [provider]  zolpidem (AMBIEN) 10 MG tablet TAKE 1 TABLET BY MOUTH EVERY NIGHT AT BEDTIME 02/16/20 08/14/20  Dian Queen, MD  zolpidem (AMBIEN) 10 MG tablet TAKE 1 TABLET BY MOUTH EVERY NIGHT AT BEDTIME 12/25/20       Allergies    Doxycycline  Review of Systems   Review of Systems  Constitutional:  Negative for chills and fever.  Eyes:  Negative for pain and visual disturbance.  Respiratory:  Negative for cough and shortness of breath.   Cardiovascular:  Negative for chest pain and palpitations.  Gastrointestinal:  Positive for nausea. Negative for abdominal pain and vomiting.  Musculoskeletal:  Negative for arthralgias and neck pain.  Neurological:  Positive for dizziness and headaches. Negative for seizures, syncope, facial asymmetry, weakness and numbness.  Psychiatric/Behavioral:  Negative for agitation  and confusion.   All other systems reviewed and are negative.  Physical Exam Updated Vital Signs BP (!) 151/84 (BP Location: Left Arm)   Pulse 93   Temp 98.4 F (36.9 C) (Oral)   Resp 18   Ht 5\' 4"  (1.626 m)   Wt 81.6 kg   SpO2 95%   BMI 30.90 kg/m   Physical Exam Constitutional:      General: She is not in acute distress. HENT:     Head: Normocephalic and atraumatic.  Eyes:     Conjunctiva/sclera: Conjunctivae normal.  Pupils: Pupils are equal, round, and reactive to light.  Cardiovascular:     Rate and Rhythm: Normal rate and regular rhythm.     Pulses: Normal pulses.  Pulmonary:     Effort: Pulmonary effort is normal. No respiratory distress.  Musculoskeletal:     Cervical back: Normal range of motion and neck supple.  Skin:    General: Skin is warm and dry.     Findings: No lesion or rash.  Neurological:     Mental Status: She is alert. Mental status is at baseline.     GCS: GCS eye subscore is 4. GCS verbal subscore is 5. GCS motor subscore is 6.     Cranial Nerves: Cranial nerves are intact.     Sensory: Sensation is intact.     Motor: Motor function is intact.     Comments: Patient demonstrating active vertigo symptoms and nystagmus Head impulse shows corrective saccades Unilateral horizontal nystagmus present with rightward gaze Test of skew shows no skew  Patient ambulated with narrow gait, no wide-gait ataxia, reporting dizziness with ambulation Negative (normal) Romberg test  Psychiatric:        Mood and Affect: Mood normal.        Behavior: Behavior normal.    ED Results / Procedures / Treatments   Labs (all labs ordered are listed, but only abnormal results are displayed) Labs Reviewed  BASIC METABOLIC PANEL - Abnormal; Notable for the following components:      Result Value   Glucose, Bld 105 (*)    All other components within normal limits  CBC WITH DIFFERENTIAL/PLATELET  PROTIME-INR  APTT    EKG EKG  Interpretation  Date/Time:  Sunday February 17 2021 12:07:45 EDT Ventricular Rate:  81 PR Interval:  156 QRS Duration: 78 QT Interval:  352 QTC Calculation: 408 R Axis:   27 Text Interpretation: Normal sinus rhythm Low voltage QRS Cannot rule out Anterior infarct , age undetermined Abnormal ECG NSR, no previous Confirmed by Lavenia Atlas [phone]) on 02/17/2021 1:28:34 PM  Radiology CT Head Wo Contrast  Result Date: 02/17/2021 CLINICAL DATA:  Neuro deficit, acute, stroke suspected Headache and dizziness. EXAM: CT HEAD WITHOUT CONTRAST TECHNIQUE: Contiguous axial images were obtained from the base of the skull through the vertex without intravenous contrast. COMPARISON:  None. FINDINGS: Brain: No intracranial hemorrhage, mass effect, or midline shift. No hydrocephalus. The basilar cisterns are patent. No evidence of territorial infarct or acute ischemia. No extra-axial or intracranial fluid collection. Vascular: No hyperdense vessel or unexpected calcification. Skull: No fracture or focal lesion. Sinuses/Orbits: Paranasal sinuses and mastoid air cells are clear. The visualized orbits are unremarkable. Other: None. IMPRESSION: Negative noncontrast head CT. Electronically Signed   By: Keith Rake M.D.   On: 02/17/2021 13:44   MR BRAIN WO CONTRAST  Result Date: 02/17/2021 CLINICAL DATA:  Dizziness EXAM: MRI HEAD WITHOUT CONTRAST TECHNIQUE: Multiplanar, multiecho pulse sequences of the brain and surrounding structures were obtained without intravenous contrast. COMPARISON:  CT head 02/17/2021 FINDINGS: Brain: No acute infarction, hemorrhage, hydrocephalus, extra-axial collection or mass lesion. Normal cerebral white matter. Vascular: Normal arterial flow voids Skull and upper cervical spine: Negative Sinuses/Orbits: Paranasal sinuses clear.  Negative orbit Other: None IMPRESSION: Negative MRI head without contrast. Electronically Signed   By: Franchot Gallo M.D.   On: 02/17/2021 18:50     Procedures Procedures   Medications Ordered in ED Medications  LORazepam (ATIVAN) injection 0.5 mg (0.5 mg Intravenous Patient Refused/Not Given 02/17/21 1944)  sodium chloride  0.9 % bolus 500 mL (0 mLs Intravenous Stopped 02/17/21 1444)  diphenhydrAMINE (BENADRYL) injection 12.5 mg (12.5 mg Intravenous Given 02/17/21 1350)  ondansetron (ZOFRAN) injection 4 mg (4 mg Intravenous Given 02/17/21 1355)  ketorolac (TORADOL) 30 MG/ML injection 30 mg (30 mg Intravenous Given 02/17/21 1552)  dexamethasone (DECADRON) injection 10 mg (10 mg Intravenous Given 02/17/21 1552)  meclizine (ANTIVERT) tablet 25 mg (25 mg Oral Given 02/17/21 2230)    ED Course  I have reviewed the triage vital signs and the nursing notes.  Pertinent labs & imaging results that were available during my care of the patient were reviewed by me and considered in my medical decision making (see chart for details).  Patient presenting with headache, nausea, vertiginous symptoms, and balance difficulty for 2 days, transferred here for MRI of the brain to evaluate for posterior/cerebellar CVA.  Differential includes CVA vs peripheral vertigo vs infection vs viral illness vs other  MRI imaging of the brain is largely unremarkable, and does not show posterior lesion or evidence of CVA.  She does not demonstrate signs of optic neuritis or neuro deficits to suggest new onset of MS.  I reviewed her labs including BMP, CBC, which showed normal WBC, no sign of anemia. No significant electrolyte derangement.  ECG reviewed showing a NSR without evidence of acute arrhythmia.  She reported no cardiac symptoms to suggest ACS.  Her neurological exam and HINTS exam was most suggestive of peripheral vertigo. I did not appreciate bi-direction or rotary nystagmus on my exam.  I had a lower suspicion for bacterial meningitis.  She has no fever, nuccal rigidity, photophobia, encephalopathy, or leukocytosis after over 48 hours of symptoms to suggest  this disease.  She does not appear diaphoretic, confused, or physically distressed.  We discussed the possibility of viral meningitis, including HSV, for which the patient had concerns.  We discussed the complications of this illness which includes seizures and other neurological sequelae. I explained that, while I had a lower suspicion for meningitis at this time, the only way to definitively determine this diagnosis is with a lumbar puncture.  After our discussion, I offered to perform this procedure, along with hospital observation.  However the patient declined. She expressed frustration about the competency of her care, and told me she would be seeking care at another hospital.    Return precautions and follow up instructions were provided at her discharge.  The patient was offered meclizine and received a dose immediately prior to leaving the ED.     Final Clinical Impression(s) / ED Diagnoses Final diagnoses:  Dizziness  Vertigo    Rx / DC Orders ED Discharge Orders          Ordered    meclizine (ANTIVERT) 25 MG tablet  3 times daily PRN        02/17/21 2144             Wyvonnia Dusky, MD 02/18/21 1125

## 2021-02-19 ENCOUNTER — Other Ambulatory Visit: Payer: Self-pay

## 2021-02-19 ENCOUNTER — Ambulatory Visit (INDEPENDENT_AMBULATORY_CARE_PROVIDER_SITE_OTHER): Payer: 59 | Admitting: Internal Medicine

## 2021-02-19 ENCOUNTER — Encounter (INDEPENDENT_AMBULATORY_CARE_PROVIDER_SITE_OTHER): Payer: Self-pay | Admitting: Internal Medicine

## 2021-02-19 ENCOUNTER — Other Ambulatory Visit (HOSPITAL_COMMUNITY): Payer: Self-pay

## 2021-02-19 VITALS — BP 104/80 | HR 82 | Temp 97.7°F | Ht 63.5 in | Wt 186.2 lb

## 2021-02-19 DIAGNOSIS — E559 Vitamin D deficiency, unspecified: Secondary | ICD-10-CM

## 2021-02-19 DIAGNOSIS — R2689 Other abnormalities of gait and mobility: Secondary | ICD-10-CM | POA: Diagnosis not present

## 2021-02-19 DIAGNOSIS — G4700 Insomnia, unspecified: Secondary | ICD-10-CM

## 2021-02-19 DIAGNOSIS — E669 Obesity, unspecified: Secondary | ICD-10-CM | POA: Diagnosis not present

## 2021-02-19 DIAGNOSIS — Z13220 Encounter for screening for lipoid disorders: Secondary | ICD-10-CM

## 2021-02-19 DIAGNOSIS — N951 Menopausal and female climacteric states: Secondary | ICD-10-CM | POA: Diagnosis not present

## 2021-02-19 DIAGNOSIS — R4789 Other speech disturbances: Secondary | ICD-10-CM | POA: Diagnosis not present

## 2021-02-19 DIAGNOSIS — R5381 Other malaise: Secondary | ICD-10-CM

## 2021-02-19 DIAGNOSIS — F520 Hypoactive sexual desire disorder: Secondary | ICD-10-CM | POA: Diagnosis not present

## 2021-02-19 DIAGNOSIS — Z131 Encounter for screening for diabetes mellitus: Secondary | ICD-10-CM

## 2021-02-19 DIAGNOSIS — B009 Herpesviral infection, unspecified: Secondary | ICD-10-CM | POA: Diagnosis not present

## 2021-02-19 DIAGNOSIS — R5383 Other fatigue: Secondary | ICD-10-CM | POA: Diagnosis not present

## 2021-02-19 MED ORDER — VALACYCLOVIR HCL 1 G PO TABS
ORAL_TABLET | ORAL | 1 refills | Status: DC
  Filled 2021-02-19: qty 20, 10d supply, fill #0
  Filled 2021-06-20: qty 20, 10d supply, fill #1

## 2021-02-19 NOTE — Progress Notes (Signed)
Metrics: Intervention Frequency ACO  Documented Smoking Status Yearly  Screened one or more times in 24 months  Cessation Counseling or  Active cessation medication Past 24 months  Past 24 months   Guideline developer: UpToDate (See UpToDate for funding source) Date Released: 2014       Wellness Office Visit  Subjective:  Patient ID: Robin Maynard, female    DOB: 1965-10-12  Age: 55 y.o. MRN: [id number]  CC: This pleasant 55 year old lady comes to our practice as a new patient and she was referred by her OB/GYN, Dr. Runell Gess. HPI  She describes poor focus and concentration, fatigue, hot flashes, insomnia, decreased libido and inability to lose weight. She also describes a tendency towards cold intolerance. Past Medical History:  Diagnosis Date   Acute pancreatitis    Constipation    Gastritis    GERD (gastroesophageal reflux disease)    GI work up in progress   Herpes    Mass on back    Obesity    PONV (postoperative nausea and vomiting)    Rheumatoid arthritis ([place])    Past Surgical History:  Procedure Laterality Date   DILITATION & CURRETTAGE/HYSTROSCOPY WITH HYDROTHERMAL ABLATION N/A 12/26/2016   Procedure: DILATATION & CURETTAGE/HYSTEROSCOPY WITH HYDROTHERMAL ABLATION;  Surgeon: Dian Queen, MD;  Location: [place];  Service: Gynecology;  Laterality: N/A;   MASS EXCISION Right 02/23/2018   Procedure: EXCISION BACK MASS ERAS PATHWAY;  Surgeon: Rolm Bookbinder, MD;  Location: [place];  Service: General;  Laterality: Right;   TONSILLECTOMY AND ADENOIDECTOMY     TYMPANOSTOMY TUBE PLACEMENT       Family History  Problem Relation Age of Onset   Deep vein thrombosis Mother    Colon polyps Mother    Heart disease Mother    Gastric cancer Father    Diabetes Father    Heart disease Father    Hypertension Father    Stomach cancer Father    Aortic stenosis Son    Breast cancer Maternal Aunt    Diabetes Paternal Grandmother     Heart disease Paternal Grandmother    Colon cancer Neg Hx    Allergic rhinitis Neg Hx    Asthma Neg Hx    Angioedema Neg Hx    Eczema Neg Hx    Urticaria Neg Hx    Rectal cancer Neg Hx    Esophageal cancer Neg Hx     Social History   Social History Narrative   Married since 1985.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   Social History   Tobacco Use   Smoking status: Never   Smokeless tobacco: Never  Substance Use Topics   Alcohol use: No    Current Meds  Medication Sig   celecoxib (CELEBREX) 100 MG capsule celecoxib 100 mg capsule  TAKE 1 CAPSULE (100 MG TOTAL) BY MOUTH 2 (TWO) TIMES DAILY AS NEEDED FOR UP TO 30 DAYS.   diclofenac Sodium (VOLTAREN) 1 % GEL APPLY TO LEFT BIG TOE - 4 GMS TWICE DAILY   esomeprazole (NEXIUM) 40 MG capsule TAKE 1 CAPSULE (40 MG TOTAL) BY MOUTH DAILY.   Magnesium Gluconate 550 MG TABS Take by mouth.   meclizine (ANTIVERT) 25 MG tablet Take 1 tablet (25 mg total) by mouth 3 (three) times daily as needed for dizziness.   meloxicam (MOBIC) 15 MG tablet TAKE 1 TABLET BY MOUTH DAILY AS NEEDED FOR PAIN.   Multiple Vitamin (MULTI-VITAMIN) tablet Take 1 tablet by mouth daily.   Omega-3 1000 MG CAPS  Take by mouth.   Turmeric 400 MG CAPS See admin instructions.   valACYclovir (VALTREX) 1000 MG tablet Take 1 tablet by mouth twice daily for 10 days   valACYclovir (VALTREX) 500 MG tablet TAKE 1 TABLET BY MOUTH DAILY FOR PROPHYLAXIS, MAY TAKE 2 TABS 3 TIMES A DAY AS NEEDED FOR BREAKTHROUGH OCCURRENCE   Vitamin D, Ergocalciferol, 50 MCG (2000 UT) CAPS 1 capsule   zolpidem (AMBIEN) 10 MG tablet TAKE 1 TABLET BY MOUTH EVERY NIGHT AT BEDTIME   [DISCONTINUED] zolpidem (AMBIEN) 10 MG tablet at bedtime as needed.    [DISCONTINUED] zolpidem (AMBIEN) 10 MG tablet TAKE 1 TABLET BY MOUTH EVERY NIGHT AT BEDTIME       Objective:   Today's Vitals: BP 104/80   Pulse 82   Temp 97.7 F (36.5 C) (Temporal)   Ht 5' 3.5" (1.613 m)   Wt 186 lb 3.2 oz (84.5 kg)    SpO2 96%   BMI 32.47 kg/m  Vitals with BMI 02/19/2021 02/17/2021 02/17/2021  Height 5' 3.5" - -  Weight 186 lbs 3 oz - -  BMI 16.60 - -  Systolic 630 160 109  Diastolic 80 84 79  Pulse 82 93 69  Some encounter information is confidential and restricted. Go to Review Flowsheets activity to see all data.     Physical Exam  She looks systemically well.  Obese.  Blood pressure in good range.     Assessment   1. Hot flashes due to menopause   2. Obesity (BMI 30-39.9)   3. Malaise and fatigue   4. Vitamin D deficiency disease   5. Insomnia, unspecified type   6. Hypoactive sexual desire disorder   7. Screening for diabetes mellitus   8. Screening for lipoid disorders       Tests ordered Orders Placed This Encounter  Procedures   DHEA-sulfate   Estradiol   Progesterone   T3, free   T4, free   TSH   Testos,Total,Free and SHBG (Female)   Lipid panel   Hemoglobin A1c      Plan: 1.  Today we spoke about nutrition with the concept of intermittent fasting and a plant-based diet.  I have given her diet sheet. 2.  Lab work has been ordered. 3.  I will see in the next several weeks to discuss all these results and further recommendations.    No orders of the defined types were placed in this encounter.   Doree Albee, MD

## 2021-02-19 NOTE — Patient Instructions (Signed)
Robin Maynard Optimal Health Dietary Recommendations for Weight Loss What to Avoid Avoid added sugars Often added sugar can be found in processed foods such as many condiments, dry cereals, cakes, cookies, chips, crisps, crackers, candies, sweetened drinks, etc.  Read labels and AVOID/DECREASE use of foods with the following in their ingredient list: Sugar, fructose, high fructose corn syrup, sucrose, glucose, maltose, dextrose, molasses, cane sugar, brown sugar, any type of syrup, agave nectar, etc.   Avoid snacking in between meals Avoid foods made with flour If you are going to eat food made with flour, choose those made with whole-grains; and, minimize your consumption as much as is tolerable Avoid processed foods These foods are generally stocked in the middle of the grocery store. Focus on shopping on the perimeter of the grocery.  Avoid Meat  We recommend following a plant-based diet at [facility]. Thus, we recommend avoiding meat as a general rule. Consider eating beans, legumes, eggs, and/or dairy products for regular protein sources If you plan on eating meat limit to 4 ounces of meat at a time and choose lean options such as Fish, chicken, [place]. Avoid red meat intake such as pork and/or steak What to Include Vegetables GREEN LEAFY VEGETABLES: Kale, spinach, mustard greens, collard greens, cabbage, broccoli, etc. OTHER: Asparagus, cauliflower, eggplant, carrots, peas, Brussel sprouts, tomatoes, bell peppers, zucchini, beets, cucumbers, etc. Grains, seeds, and legumes Beans: kidney beans, black eyed peas, garbanzo beans, black beans, pinto beans, etc. Whole, unrefined grains: brown rice, barley, bulgur, oatmeal, etc. Healthy fats  Avoid highly processed fats such as vegetable oil Examples of healthy fats: avocado, olives, virgin olive oil, dark chocolate (?72% Cocoa), nuts (peanuts, almonds, walnuts, cashews, pecans, etc.) None to Low Intake of Animal Sources of Protein Meat  sources: chicken, [place], salmon, tuna. Limit to 4 ounces of meat at one time. Consider limiting dairy sources, but when choosing dairy focus on: PLAIN [place] yogurt, cottage cheese, high-protein milk Fruit Choose berries  When to Eat Intermittent Fasting: Choosing not to eat for a specific time period, but DO FOCUS ON HYDRATION when fasting Multiple Techniques: Time Restricted Eating: eat 3 meals in a day, each meal lasting no more than 60 minutes, no snacks between meals 16-18 hour fast: fast for 16 to 18 hours up to 7 days a week. Often suggested to start with 2-3 nonconsecutive days per week.  Remember the time you sleep is counted as fasting.  Examples of eating schedule: Fast from 7:00pm-11:00am. Eat between 11:00am-7:00pm.  24-hour fast: fast for 24 hours up to every other day. Often suggested to start with 1 day per week Remember the time you sleep is counted as fasting Examples of eating schedule:  Eating day: eat 2-3 meals on your eating day. If doing 2 meals, each meal should last no more than 90 minutes. If doing 3 meals, each meal should last no more than 60 minutes. Finish last meal by 7:00pm. Fasting day: Fast until 7:00pm.  IF YOU FEEL UNWELL FOR ANY REASON/IN ANY WAY WHEN FASTING, STOP FASTING BY EATING A NUTRITIOUS SNACK OR LIGHT MEAL ALWAYS FOCUS ON HYDRATION DURING FASTS Acceptable Hydration sources: water, broths, tea/coffee (black tea/coffee is best but using a small amount of whole-fat dairy products in coffee/tea is acceptable).  Poor Hydration Sources: anything with sugar or artificial sweeteners added to it  These recommendations have been developed for patients that are actively receiving medical care from either Dr. Anastasio Champion or Jeralyn Ruths, DNP, NP-C at [facility]. These recommendations  are developed for patients with specific medical conditions and are not meant to be distributed or used by others that are not actively receiving care from either provider  listed above at [facility]. It is not appropriate to participate in the above eating plans without proper medical supervision.   Reference: Rexanne Mano. The obesity code. [place]; 2016.

## 2021-02-20 DIAGNOSIS — G8929 Other chronic pain: Secondary | ICD-10-CM | POA: Diagnosis not present

## 2021-02-20 DIAGNOSIS — R519 Headache, unspecified: Secondary | ICD-10-CM | POA: Diagnosis not present

## 2021-02-21 DIAGNOSIS — M791 Myalgia, unspecified site: Secondary | ICD-10-CM | POA: Diagnosis not present

## 2021-02-21 DIAGNOSIS — R270 Ataxia, unspecified: Secondary | ICD-10-CM | POA: Diagnosis not present

## 2021-02-21 DIAGNOSIS — R519 Headache, unspecified: Secondary | ICD-10-CM | POA: Diagnosis not present

## 2021-02-21 DIAGNOSIS — H04123 Dry eye syndrome of bilateral lacrimal glands: Secondary | ICD-10-CM | POA: Diagnosis not present

## 2021-02-22 LAB — T3, FREE: T3, Free: 3.6 pg/mL (ref 2.3–4.2)

## 2021-02-22 LAB — LIPID PANEL
Cholesterol: 238 mg/dL — ABNORMAL HIGH (ref <200)
HDL: 69 mg/dL (ref >=50)
LDL Cholesterol (Calc): 130 mg/dL (calc) — ABNORMAL HIGH
Non-HDL Cholesterol (Calc): 169 mg/dL (calc) — ABNORMAL HIGH (ref <130)
Total CHOL/HDL Ratio: 3.4 (calc) (ref <5.0)
Triglycerides: 240 mg/dL — ABNORMAL HIGH (ref <150)

## 2021-02-22 LAB — TESTOS,TOTAL,FREE AND SHBG (FEMALE)
Free Testosterone: 0.5 pg/mL (ref 0.1–6.4)
Sex Hormone Binding: 45 nmol/L (ref 17–124)
Testosterone, Total, LC-MS-MS: 4 ng/dL (ref 2–45)

## 2021-02-22 LAB — PROGESTERONE: Progesterone: <0.5 ng/mL

## 2021-02-22 LAB — TSH: TSH: 3.71 mIU/L

## 2021-02-22 LAB — ESTRADIOL: Estradiol: 17 pg/mL

## 2021-02-22 LAB — HEMOGLOBIN A1C
Hgb A1c MFr Bld: 6 % of total Hgb — ABNORMAL HIGH (ref <5.7)
Mean Plasma Glucose: 126 mg/dL
eAG (mmol/L): 7 mmol/L

## 2021-02-22 LAB — DHEA-SULFATE: DHEA-SO4: 56 ug/dL (ref 5–167)

## 2021-02-22 LAB — T4, FREE: Free T4: 1.3 ng/dL (ref 0.8–1.8)

## 2021-03-06 ENCOUNTER — Other Ambulatory Visit (HOSPITAL_COMMUNITY): Payer: Self-pay

## 2021-03-06 DIAGNOSIS — G8929 Other chronic pain: Secondary | ICD-10-CM | POA: Diagnosis not present

## 2021-03-06 DIAGNOSIS — R519 Headache, unspecified: Secondary | ICD-10-CM | POA: Diagnosis not present

## 2021-03-06 MED ORDER — SUMATRIPTAN SUCCINATE 50 MG PO TABS
ORAL_TABLET | ORAL | 0 refills | Status: DC
  Filled 2021-03-06: qty 2, 2d supply, fill #0

## 2021-03-07 ENCOUNTER — Other Ambulatory Visit (HOSPITAL_COMMUNITY): Payer: Self-pay

## 2021-03-08 ENCOUNTER — Other Ambulatory Visit (HOSPITAL_COMMUNITY): Payer: Self-pay

## 2021-03-11 ENCOUNTER — Other Ambulatory Visit (HOSPITAL_COMMUNITY): Payer: Self-pay

## 2021-03-12 ENCOUNTER — Other Ambulatory Visit (HOSPITAL_COMMUNITY): Payer: Self-pay

## 2021-03-13 ENCOUNTER — Other Ambulatory Visit (HOSPITAL_COMMUNITY): Payer: Self-pay

## 2021-03-15 ENCOUNTER — Other Ambulatory Visit (HOSPITAL_COMMUNITY): Payer: Self-pay

## 2021-03-16 ENCOUNTER — Other Ambulatory Visit (HOSPITAL_COMMUNITY): Payer: Self-pay

## 2021-03-18 ENCOUNTER — Ambulatory Visit (INDEPENDENT_AMBULATORY_CARE_PROVIDER_SITE_OTHER): Payer: 59 | Admitting: Internal Medicine

## 2021-03-18 ENCOUNTER — Other Ambulatory Visit (HOSPITAL_COMMUNITY): Payer: Self-pay

## 2021-03-20 ENCOUNTER — Other Ambulatory Visit (HOSPITAL_COMMUNITY): Payer: Self-pay

## 2021-03-20 DIAGNOSIS — R42 Dizziness and giddiness: Secondary | ICD-10-CM | POA: Diagnosis not present

## 2021-03-20 DIAGNOSIS — R519 Headache, unspecified: Secondary | ICD-10-CM | POA: Diagnosis not present

## 2021-03-20 MED ORDER — VALACYCLOVIR HCL 1 G PO TABS
ORAL_TABLET | ORAL | 1 refills | Status: DC
  Filled 2021-03-20: qty 20, 10d supply, fill #0
  Filled 2021-05-27: qty 20, 10d supply, fill #1

## 2021-03-22 ENCOUNTER — Other Ambulatory Visit (HOSPITAL_COMMUNITY): Payer: Self-pay

## 2021-03-25 ENCOUNTER — Other Ambulatory Visit (HOSPITAL_COMMUNITY): Payer: Self-pay

## 2021-03-25 DIAGNOSIS — H04123 Dry eye syndrome of bilateral lacrimal glands: Secondary | ICD-10-CM | POA: Diagnosis not present

## 2021-03-26 ENCOUNTER — Other Ambulatory Visit (HOSPITAL_COMMUNITY): Payer: Self-pay

## 2021-03-29 ENCOUNTER — Other Ambulatory Visit (HOSPITAL_COMMUNITY): Payer: Self-pay

## 2021-04-01 ENCOUNTER — Other Ambulatory Visit (HOSPITAL_COMMUNITY): Payer: Self-pay

## 2021-04-03 ENCOUNTER — Ambulatory Visit (INDEPENDENT_AMBULATORY_CARE_PROVIDER_SITE_OTHER): Payer: 59 | Admitting: Internal Medicine

## 2021-04-05 ENCOUNTER — Telehealth: Payer: Self-pay

## 2021-04-05 NOTE — Telephone Encounter (Signed)
-----   Message from Milus Banister, MD sent at 04/05/2021 10:38 AM EDT ----- See Deloris Ping message I just sent to her. She needs colonsocopy for abnormal colon on recent CT. 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  Thanks

## 2021-04-05 NOTE — Telephone Encounter (Signed)
Message sent to the pt to contact me with her preference of location for her procedure.

## 2021-04-09 ENCOUNTER — Telehealth: Payer: Self-pay

## 2021-04-09 NOTE — Telephone Encounter (Signed)
Dr Ardis Hughs does she still need the office visit for 9/13?

## 2021-04-09 NOTE — Telephone Encounter (Signed)
-----   Message from Timothy Lasso, RN sent at 04/05/2021 10:46 AM EDT ----- Milus Banister, MD  Timothy Lasso, RN See mychart message I just sent to her. She needs colonsocopy for abnormal colon on recent CT. 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 Thanks

## 2021-04-10 NOTE — Telephone Encounter (Signed)
The appt has been cancelled and the pt has been advised

## 2021-04-11 ENCOUNTER — Other Ambulatory Visit (HOSPITAL_COMMUNITY): Payer: Self-pay

## 2021-04-11 MED ORDER — VALACYCLOVIR HCL 500 MG PO TABS
ORAL_TABLET | ORAL | 1 refills | Status: DC
  Filled 2021-04-11: qty 90, 90d supply, fill #0
  Filled 2022-03-10: qty 90, 90d supply, fill #1

## 2021-04-12 ENCOUNTER — Other Ambulatory Visit (HOSPITAL_COMMUNITY): Payer: Self-pay

## 2021-04-16 ENCOUNTER — Ambulatory Visit: Payer: 59 | Admitting: Gastroenterology

## 2021-04-25 ENCOUNTER — Other Ambulatory Visit: Payer: Self-pay

## 2021-04-25 DIAGNOSIS — R1032 Left lower quadrant pain: Secondary | ICD-10-CM

## 2021-05-08 DIAGNOSIS — G8929 Other chronic pain: Secondary | ICD-10-CM | POA: Diagnosis not present

## 2021-05-08 DIAGNOSIS — R519 Headache, unspecified: Secondary | ICD-10-CM | POA: Diagnosis not present

## 2021-05-15 DIAGNOSIS — G4700 Insomnia, unspecified: Secondary | ICD-10-CM | POA: Diagnosis not present

## 2021-05-15 DIAGNOSIS — M069 Rheumatoid arthritis, unspecified: Secondary | ICD-10-CM | POA: Diagnosis not present

## 2021-05-15 DIAGNOSIS — Z1389 Encounter for screening for other disorder: Secondary | ICD-10-CM | POA: Diagnosis not present

## 2021-05-15 DIAGNOSIS — B009 Herpesviral infection, unspecified: Secondary | ICD-10-CM | POA: Diagnosis not present

## 2021-05-15 DIAGNOSIS — Z1331 Encounter for screening for depression: Secondary | ICD-10-CM | POA: Diagnosis not present

## 2021-05-15 DIAGNOSIS — R109 Unspecified abdominal pain: Secondary | ICD-10-CM | POA: Diagnosis not present

## 2021-05-15 DIAGNOSIS — E063 Autoimmune thyroiditis: Secondary | ICD-10-CM | POA: Diagnosis not present

## 2021-05-15 DIAGNOSIS — G43909 Migraine, unspecified, not intractable, without status migrainosus: Secondary | ICD-10-CM | POA: Diagnosis not present

## 2021-05-15 DIAGNOSIS — R2681 Unsteadiness on feet: Secondary | ICD-10-CM | POA: Diagnosis not present

## 2021-05-22 ENCOUNTER — Encounter (HOSPITAL_COMMUNITY): Payer: Self-pay

## 2021-05-22 ENCOUNTER — Ambulatory Visit (HOSPITAL_COMMUNITY)
Admission: RE | Admit: 2021-05-22 | Discharge: 2021-05-22 | Disposition: A | Discharge status: Home / Self Care | Payer: 59 | Source: Ambulatory Visit | Attending: Gastroenterology | Admitting: Gastroenterology

## 2021-05-22 DIAGNOSIS — R109 Unspecified abdominal pain: Secondary | ICD-10-CM | POA: Diagnosis not present

## 2021-05-22 DIAGNOSIS — R1032 Left lower quadrant pain: Secondary | ICD-10-CM | POA: Diagnosis not present

## 2021-05-22 MED ORDER — IOHEXOL 350 MG/ML SOLN
80.0000 mL | Freq: Once | INTRAVENOUS | Status: AC | PRN
  Administered 2021-05-22: 80 mL via INTRAVENOUS

## 2021-05-27 ENCOUNTER — Other Ambulatory Visit (HOSPITAL_COMMUNITY): Payer: Self-pay

## 2021-05-29 ENCOUNTER — Encounter: Payer: Self-pay | Admitting: Gastroenterology

## 2021-05-29 ENCOUNTER — Other Ambulatory Visit: Payer: Self-pay

## 2021-05-29 ENCOUNTER — Ambulatory Visit (AMBULATORY_SURGERY_CENTER): Payer: 59 | Admitting: *Deleted

## 2021-05-29 ENCOUNTER — Other Ambulatory Visit (HOSPITAL_COMMUNITY): Payer: Self-pay

## 2021-05-29 VITALS — Ht 64.0 in | Wt 175.0 lb

## 2021-05-29 DIAGNOSIS — R935 Abnormal findings on diagnostic imaging of other abdominal regions, including retroperitoneum: Secondary | ICD-10-CM

## 2021-05-29 MED ORDER — NA SULFATE-K SULFATE-MG SULF 17.5-3.13-1.6 GM/177ML PO SOLN
1.0000 | Freq: Once | ORAL | 0 refills | Status: AC
  Filled 2021-05-29: qty 354, 1d supply, fill #0

## 2021-05-29 NOTE — Progress Notes (Signed)
No egg or soy allergy known to patient  issues known to pt with past sedation with any surgeries or procedures of PONV  Patient denies ever being told they had issues or difficulty with intubation  No FH of Malignant Hyperthermia Pt is not on diet pills Pt is not on  home 02  Pt is not on blood thinners  Pt denies issues with constipation - pt states not a current issues  No A fib or A flutter  Pt is fully vaccinated  for Covid   NO PA's for preps discussed with pt In PV today  Discussed with pt there will be an out-of-pocket cost for prep and that varies from $0 to 70 +  dollars - pt verbalized understanding   Due to the COVID-19 pandemic we are asking patients to follow certain guidelines in PV and the [place]   Pt aware of COVID protocols and [facility] guidelines   Pt verified name, DOB, address and insurance during PV today.  PV completed over the phone.  Pt encouraged to call with questions or issues.  My Chart instructions to pt as procedure is next wek and no way packet will get to her-

## 2021-05-31 ENCOUNTER — Other Ambulatory Visit (HOSPITAL_COMMUNITY): Payer: Self-pay

## 2021-06-04 ENCOUNTER — Encounter: Payer: Self-pay | Admitting: Gastroenterology

## 2021-06-04 ENCOUNTER — Ambulatory Visit (AMBULATORY_SURGERY_CENTER): Payer: 59 | Admitting: Gastroenterology

## 2021-06-04 VITALS — BP 118/76 | HR 65 | Temp 98.4°F | Resp 17 | Ht 63.0 in | Wt 175.0 lb

## 2021-06-04 DIAGNOSIS — K5730 Diverticulosis of large intestine without perforation or abscess without bleeding: Secondary | ICD-10-CM

## 2021-06-04 DIAGNOSIS — R1084 Generalized abdominal pain: Secondary | ICD-10-CM

## 2021-06-04 DIAGNOSIS — R935 Abnormal findings on diagnostic imaging of other abdominal regions, including retroperitoneum: Secondary | ICD-10-CM | POA: Diagnosis not present

## 2021-06-04 DIAGNOSIS — R933 Abnormal findings on diagnostic imaging of other parts of digestive tract: Secondary | ICD-10-CM | POA: Diagnosis not present

## 2021-06-04 MED ORDER — SODIUM CHLORIDE 0.9 % IV SOLN: 500.0000 mL | Freq: Once | INTRAVENOUS | Status: DC

## 2021-06-04 NOTE — Op Note (Signed)
[facility] Patient Name: Robin Maynard Procedure Date: 06/04/2021 1:26 PM MRN: [id number] Endoscopist: Milus Banister , MD Age: 55 Referring MD:  Date of Birth: 07-Jul-1966 Gender: Female Account #: [id number] Procedure:                Colonoscopy Indications:              Intermittent lower abd pains, often left sided;                            colonsocoy 2019 single subCM adenoma removed Medicines:                Monitored Anesthesia Care Procedure:                Pre-Anesthesia Assessment:                           - Prior to the procedure, a History and Physical                            was performed, and patient medications and                            allergies were reviewed. The patient's tolerance of                            previous anesthesia was also reviewed. The risks                            and benefits of the procedure and the sedation                            options and risks were discussed with the patient.                            All questions were answered, and informed consent                            was obtained. Prior Anticoagulants: The patient has                            taken no previous anticoagulant or antiplatelet                            agents. ASA Grade Assessment: II - A patient with                            mild systemic disease. After reviewing the risks                            and benefits, the patient was deemed in                            satisfactory condition to undergo the procedure.  After obtaining informed consent, the colonoscope                            was passed under direct vision. Throughout the                            procedure, the patient's blood pressure, pulse, and                            oxygen saturations were monitored continuously. The                            Olympus CF-HQ190L [id number]) Colonoscope was                            introduced through  the anus and advanced to the the                            terminal ileum. The colonoscopy was performed                            without difficulty. The patient tolerated the                            procedure well. The quality of the bowel                            preparation was good. The terminal ileum, ileocecal                            valve, appendiceal orifice, and rectum were                            photographed. Scope In: 1:48:30 PM Scope Out: 1:58:20 PM Scope Withdrawal Time: 0 hours 6 minutes 39 seconds  Total Procedure Duration: 0 hours 9 minutes 50 seconds  Findings:                 Multiple small and large-mouthed diverticula were                            found in the left colon. The mucosa in this region                            was slightly edematous and a bit tortuous.                           The exam was otherwise without abnormality on                            direct and retroflexion views.                           The colon was randomly biopsied in left (jar 1) and  right (jar 2) colon to check for microscopic                            inflammation. Complications:            No immediate complications. Estimated blood loss:                            None. Estimated Blood Loss:     Estimated blood loss: none. Impression:               - Multiple small and large-mouthed diverticula were                            found in the left colon. The mucosa in this region                            was slightly edematous and a bit tortuous.                           - The examination was otherwise normal on direct                            and retroflexion views.                           - The colon was randomly biopsied in left (jar 1)                            and right (jar 2) colon to check for microscopic                            inflammation. Recommendation:           - Patient has a contact number available for                             emergencies. The signs and symptoms of potential                            delayed complications were discussed with the                            patient. Return to normal activities tomorrow.                            Written discharge instructions were provided to the                            patient.                           - Resume previous diet.                           - Continue present medications.                           -  Await pathology results. Will consider trial or                            oral mesalamine (diverticular associated                            inflammation, edema?) after path results are back. Milus Banister, MD 06/04/2021 2:04:13 PM This report has been signed electronically.

## 2021-06-04 NOTE — Patient Instructions (Signed)
Resume previous diet and continue current medications. Awaiting pathology results. Will consider trial or oral mesalamine after path results are back.  YOU HAD AN ENDOSCOPIC PROCEDURE TODAY AT [facility]:   Refer to the procedure report that was given to you for any specific questions about what was found during the examination.  If the procedure report does not answer your questions, please call your gastroenterologist to clarify.  If you requested that your care partner not be given the details of your procedure findings, then the procedure report has been included in a sealed envelope for you to review at your convenience later.  YOU SHOULD EXPECT: Some feelings of bloating in the abdomen. Passage of more gas than usual.  Walking can help get rid of the air that was put into your GI tract during the procedure and reduce the bloating. If you had a lower endoscopy (such as a colonoscopy or flexible sigmoidoscopy) you may notice spotting of blood in your stool or on the toilet paper. If you underwent a bowel prep for your procedure, you may not have a normal bowel movement for a few days.  Please Note:  You might notice some irritation and congestion in your nose or some drainage.  This is from the oxygen used during your procedure.  There is no need for concern and it should clear up in a day or so.  SYMPTOMS TO REPORT IMMEDIATELY:  Following lower endoscopy (colonoscopy or flexible sigmoidoscopy):  Excessive amounts of blood in the stool  Significant tenderness or worsening of abdominal pains  Swelling of the abdomen that is new, acute  Fever of 100F or higher  For urgent or emergent issues, a gastroenterologist can be reached at any hour by calling [phone]. Do not use MyChart messaging for urgent concerns.    DIET:  We do recommend a small meal at first, but then you may proceed to your regular diet.  Drink plenty of fluids but you should avoid alcoholic beverages for  24 hours.  ACTIVITY:  You should plan to take it easy for the rest of today and you should NOT DRIVE or use heavy machinery until tomorrow (because of the sedation medicines used during the test).    FOLLOW UP: Our staff will call the number listed on your records 48-72 hours following your procedure to check on you and address any questions or concerns that you may have regarding the information given to you following your procedure. If we do not reach you, we will leave a message.  We will attempt to reach you two times.  During this call, we will ask if you have developed any symptoms of COVID 19. If you develop any symptoms (ie: fever, flu-like symptoms, shortness of breath, cough etc.) before then, please call [phone].  If you test positive for Covid 19 in the 2 weeks post procedure, please call and report this information to [place].    If any biopsies were taken you will be contacted by phone or by letter within the next 1-3 weeks.  Please call us at [phone] if you have not heard about the biopsies in 3 weeks.    SIGNATURES/CONFIDENTIALITY: You and/or your care partner have signed paperwork which will be entered into your electronic medical record.  These signatures attest to the fact that that the information above on your After Visit Summary has been reviewed and is understood.  Full responsibility of the confidentiality of this discharge information lies with you and/or  your care-partner.

## 2021-06-04 NOTE — Progress Notes (Signed)
HPI: This is a woman with abd pains, lower usually   ROS: complete GI ROS as described in HPI, all other review negative.  Constitutional:  No unintentional weight loss   Past Medical History:  Diagnosis Date   Acute pancreatitis    Constipation    Gastritis    GERD (gastroesophageal reflux disease)    GI work up in progress   Hashimoto's thyroiditis    Herpes    Mass on back    Obesity    PONV (postoperative nausea and vomiting)    Rheumatoid arthritis ([place])     Past Surgical History:  Procedure Laterality Date   COLONOSCOPY     DILITATION & CURRETTAGE/HYSTROSCOPY WITH HYDROTHERMAL ABLATION N/A 12/26/2016   Procedure: DILATATION & CURETTAGE/HYSTEROSCOPY WITH HYDROTHERMAL ABLATION;  Surgeon: Dian Queen, MD;  Location: [place];  Service: Gynecology;  Laterality: N/A;   MASS EXCISION Right 02/23/2018   Procedure: EXCISION BACK MASS ERAS PATHWAY;  Surgeon: Rolm Bookbinder, MD;  Location: [facility];  Service: General;  Laterality: Right;   POLYPECTOMY     TONSILLECTOMY AND ADENOIDECTOMY     TYMPANOSTOMY TUBE PLACEMENT      Current Outpatient Medications  Medication Sig Dispense Refill   Multiple Vitamin (MULTI-VITAMIN) tablet Take 1 tablet by mouth daily.     Omega-3 1000 MG CAPS Take by mouth.     Turmeric 400 MG CAPS See admin instructions.     valACYclovir (VALTREX) 500 MG tablet Take 1 tablet by mouth once a day 90 tablet 1   diclofenac Sodium (VOLTAREN) 1 % GEL APPLY TO LEFT BIG TOE - 4 GMS TWICE DAILY 100 g 2   esomeprazole (NEXIUM) 40 MG capsule TAKE 1 CAPSULE (40 MG TOTAL) BY MOUTH DAILY. (Patient taking differently: Take by mouth as needed.) 30 capsule 1   Magnesium Gluconate 550 MG TABS Take by mouth.     valACYclovir (VALTREX) 1000 MG tablet Take 1 tablet by mouth twice daily for 10 days 20 tablet 1   Vitamin D, Ergocalciferol, 50 MCG (2000 UT) CAPS 1 capsule     zolpidem (AMBIEN) 10 MG tablet TAKE 1 TABLET BY MOUTH  EVERY NIGHT AT BEDTIME 30 tablet 3   Current Facility-Administered Medications  Medication Dose Route Frequency Provider Last Rate Last Admin   0.9 %  sodium chloride infusion  500 mL Intravenous Once Milus Banister, MD        Allergies as of 06/04/2021 - Review Complete 06/04/2021  Allergen Reaction Noted   Doxycycline Nausea And Vomiting 06/21/2019   Flulaval quadrivalent [influenza vac split quad] Other (See Comments) 10/31/2019   Hydroxychloroquine Other (See Comments) 08/16/2020    Family History  Problem Relation Age of Onset   Deep vein thrombosis Mother    Colon polyps Mother    Heart disease Mother    Gastric cancer Father    Diabetes Father    Heart disease Father    Hypertension Father    Stomach cancer Father    Aortic stenosis Son    Breast cancer Maternal Aunt    Diabetes Paternal Grandmother    Heart disease Paternal Grandmother    Colon cancer Neg Hx    Allergic rhinitis Neg Hx    Asthma Neg Hx    Angioedema Neg Hx    Eczema Neg Hx    Urticaria Neg Hx    Rectal cancer Neg Hx    Esophageal cancer Neg Hx     Social History   Socioeconomic  History   Marital status: Married    Spouse name: Not on file   Number of children: Not on file   Years of education: Not on file   Highest education level: Not on file  Occupational History   Not on file  Tobacco Use   Smoking status: Never   Smokeless tobacco: Never  Vaping Use   Vaping Use: Never used  Substance and Sexual Activity   Alcohol use: No   Drug use: No   Sexual activity: Not on file    Comment: ablation  Other Topics Concern   Not on file  Social History Narrative   Married since 1985.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   Social Determinants of Health   Financial Resource Strain: Not on file  Food Insecurity: Not on file  Transportation Needs: Not on file  Physical Activity: Not on file  Stress: Not on file  Social Connections: Not on file  Intimate Partner Violence: Not on file      Physical Exam: BP 135/85   Pulse 72   Temp 98.4 F (36.9 C)   Resp 10   Ht 5\' 3"  (1.6 m)   Wt 175 lb (79.4 kg)   SpO2 99%   BMI 31.00 kg/m  Constitutional: generally well-appearing Psychiatric: alert and oriented x3 Lungs: CTA bilaterally Heart: no MCR  Assessment and plan: 55 y.o. female withabd pains  Colonsocopy today  Care is appropriate for the ambulatory setting.  Owens Loffler, MD [facility] 06/04/2021, 1:45 PM

## 2021-06-04 NOTE — Progress Notes (Signed)
VS by CW. ?

## 2021-06-04 NOTE — Progress Notes (Signed)
To pacu, VSS. Report to [email or web address] 

## 2021-06-06 ENCOUNTER — Telehealth: Payer: Self-pay | Admitting: *Deleted

## 2021-06-06 NOTE — Telephone Encounter (Signed)
  Follow up Call-  Call back number 06/04/2021  Post procedure Call Back phone  # [phone]  Permission to leave phone message Yes  Some recent data might be hidden     Patient questions:  Do you have a fever, pain , or abdominal swelling? No. Pain Score  0 *  Have you tolerated food without any problems? Yes.    Have you been able to return to your normal activities? Yes.    Do you have any questions about your discharge instructions: Diet   No. Medications  No. Follow up visit  No.  Do you have questions or concerns about your Care? No.  Actions: * If pain score is 4 or above: No action needed, pain <4.  Have you developed a fever since your procedure? no  2.   Have you had an respiratory symptoms (SOB or cough) since your procedure? no  3.   Have you tested positive for COVID 19 since your procedure no  4.   Have you had any family members/close contacts diagnosed with the COVID 19 since your procedure?  no   If yes to any of these questions please route to Joylene John, RN and Joella Prince, RN

## 2021-06-07 ENCOUNTER — Other Ambulatory Visit (HOSPITAL_COMMUNITY): Payer: Self-pay

## 2021-06-07 ENCOUNTER — Other Ambulatory Visit: Payer: Self-pay

## 2021-06-07 MED ORDER — MESALAMINE 1.2 G PO TBEC
4.8000 g | DELAYED_RELEASE_TABLET | Freq: Every day | ORAL | 3 refills | Status: DC
  Filled 2021-06-07: qty 120, 30d supply, fill #0

## 2021-06-20 ENCOUNTER — Other Ambulatory Visit (HOSPITAL_COMMUNITY): Payer: Self-pay

## 2021-06-20 DIAGNOSIS — B002 Herpesviral gingivostomatitis and pharyngotonsillitis: Secondary | ICD-10-CM | POA: Diagnosis not present

## 2021-06-20 DIAGNOSIS — Z0000 Encounter for general adult medical examination without abnormal findings: Secondary | ICD-10-CM | POA: Diagnosis not present

## 2021-06-20 MED ORDER — VALACYCLOVIR HCL 1 G PO TABS
ORAL_TABLET | ORAL | 1 refills | Status: DC
  Filled 2021-06-20: qty 90, 90d supply, fill #0

## 2021-06-21 ENCOUNTER — Other Ambulatory Visit (HOSPITAL_COMMUNITY): Payer: Self-pay

## 2021-06-21 MED ORDER — VALACYCLOVIR HCL 500 MG PO TABS
ORAL_TABLET | ORAL | 0 refills | Status: DC
  Filled 2021-06-21: qty 180, 90d supply, fill #0

## 2021-06-22 ENCOUNTER — Other Ambulatory Visit (HOSPITAL_COMMUNITY): Payer: Self-pay

## 2021-07-04 ENCOUNTER — Other Ambulatory Visit: Payer: Self-pay

## 2021-07-04 ENCOUNTER — Ambulatory Visit (INDEPENDENT_AMBULATORY_CARE_PROVIDER_SITE_OTHER): Payer: 59 | Admitting: Internal Medicine

## 2021-07-04 ENCOUNTER — Encounter: Payer: Self-pay | Admitting: Internal Medicine

## 2021-07-04 VITALS — BP 118/72 | HR 74 | Ht 63.0 in | Wt 179.4 lb

## 2021-07-04 DIAGNOSIS — E559 Vitamin D deficiency, unspecified: Secondary | ICD-10-CM | POA: Diagnosis not present

## 2021-07-04 DIAGNOSIS — E063 Autoimmune thyroiditis: Secondary | ICD-10-CM | POA: Diagnosis not present

## 2021-07-04 LAB — T3, FREE: T3, Free: 3.1 pg/mL (ref 2.3–4.2)

## 2021-07-04 LAB — VITAMIN D 25 HYDROXY (VIT D DEFICIENCY, FRACTURES): VITD: 28.55 ng/mL — ABNORMAL LOW (ref 30.00–100.00)

## 2021-07-04 LAB — T4, FREE: Free T4: 0.94 ng/dL (ref 0.60–1.60)

## 2021-07-04 LAB — TSH: TSH: 1.76 u[IU]/mL (ref 0.35–5.50)

## 2021-07-04 NOTE — Patient Instructions (Signed)
Please stop at the lab.  Please continue multivitamins with selenium.  Please come back for a follow-up appointment in 1 year.

## 2021-07-04 NOTE — Progress Notes (Signed)
Patient ID: Robin Maynard, female   DOB: 1966/01/21, 55 y.o.   MRN: [id number]   This visit occurred during the SARS-CoV-2 public health emergency.  Safety protocols were in place, including screening questions prior to the visit, additional usage of staff PPE, and extensive cleaning of exam room while observing appropriate contact time as indicated for disinfecting solutions.   HPI  Robin Maynard is a 55 y.o.-year-old female, initially referred by her PCP, Dr.Le, returning for follow-up for for low cortisol level -ruled out for adrenal insufficiency, also, euthyroid Hashimoto's thyroiditis.  Last visit 1 year ago.  Interim history: She had ataxia this summer >> resolved. She also had diverticulitis >> resolved. She is doing intermittent fasting daily - eats once a day. Her joint pains and fatigue >> resolved after starting this.  History of low cortisol level: She had URI 09/2016 >> L upper abd. Pain >> ED >> CT scan - hazing around sSMA >> referred to Dr. Ardis Hughs >> further investig. Was negative >> sent back to PCP, who checked a cortisol level >> low cortisol level in 03/2017.  No h/o steroid use in the past (Dexamethasone 10 mg in 12/2016). No h/o Depo-provera, Megace, po ketoconazole, phenytoin, rifampin, chronic fluconazole use. No h/o autoimmune diseases in pt or family mbs. No excess use of NSAIDs. No h/o generalized infections or HIV. No IVDA. No h/o head injury or severe HA. No h/o malignancy.  Reviewed previous investigation:         Office Visit on 08/10/2017  Component Date Value Ref Range Status   C206 ACTH 08/10/2017 20  6 - 50 pg/mL Final    Comment: Reference range applies only to specimens collected between 7am-10am     Cortisol, Plasma 08/10/2017 7.7  ug/dL Final    AM:  4.3 - 22.4 ug/dLPM:  3.1 - 16.7 ug/dL   Cortisol, Plasma 08/10/2017 19.2  ug/dL Final    AM:  4.3 - 22.4 ug/dLPM:  3.1 - 16.7 ug/dL   Cortisol, Plasma 08/10/2017 22.7  ug/dL Final     AM:  4.3 - 22.4 ug/dLPM:  3.1 - 16.7 ug/dL   03/19/2017: cortisol 4.9 (am)  No history of hyponatremia or hyperkalemia:   Chemistry      Component Value Date/Time   NA 137 02/17/2021 1335   K 4.0 02/17/2021 1335   CL 105 02/17/2021 1335   CO2 25 02/17/2021 1335   BUN 14 02/17/2021 1335   CREATININE 0.69 02/17/2021 1335   CREATININE 0.81 01/26/2019 0909      Component Value Date/Time   CALCIUM 9.1 02/17/2021 1335   ALKPHOS 58 09/29/2016 0524   AST 16 01/26/2019 0909   ALT 24 01/26/2019 0909   BILITOT 0.6 01/26/2019 0909     Pt. also has a history of DUB, s/p ablation 12/2016, GERD, elevated FSH of 53 in 03/2017.  Reviewed history: Patient describes that in ~2017 she started to be very tired and this exacerbated over time.  However, since 04/2018 she feels that her fatigue improved slightly.  Her PCP investigated her for possible autoimmune diseases and also chronic viral infections and the investigation was negative except for elevated thyroglobulin antibodies.  She was referred back to endocrinology.  Hashimoto's thyroiditis:  Reviewed previous investigation: Component     Latest Ref Rng & Units 10/26/2018 06/29/2019 07/03/2020  Thyroglobulin Ab     < or = 1 IU/mL 6 (H) 5 (H) 8 (H)  Thyroperoxidase Ab SerPl-aCnc     <9 IU/mL  2  06/08/2018: Thyroglobulin Ab 6.7, TPO Ab 10  Component     Latest Ref Rng & Units 06/29/2019  Thyroglobulin Ab     < or = 1 IU/mL 5 (H)  VITD     30.00 - 100.00 ng/mL 33.54  Vitamin B12     211 - 911 pg/mL 812   Previous TSH levels: Lab Results  Component Value Date   TSH 3.71 02/19/2021   TSH 1.58 07/03/2020   TSH 1.69 06/29/2019   TSH 1.41 10/26/2018   TSH 1.48 06/08/2018   I recommended to start selenium to help lower her antithyroid antibodies.  She started a multivitamin + Selenium 150 mcg daily and she felt much better afterwards. This contains Biotin 1200 mcg - did not take this today.  She previously saw rheumatology.  She  was found to have hypogammaglobulinemia. She also saw an immunologist >> she was reassured about this condition.  However, she was then seen by rheumatology for a second opinion at [facility] >> possible RA. Started HO Chloroquine >> nausea, GERD, metallic taste in mouth >> stopped.  As of now, her joint pains resolved.  She is on Turmeric, ginger root, omega 3 FAs.   At last visit, she was on vitamin D 2000 + 5000 units - stopped during the summer- now restarted 2000 IU 1 week abnormal.  Reviewed previous vitamin D levels: Lab Results  Component Value Date   VD25OH 33.54 06/29/2019   ROS: + see HPI  I reviewed pt's medications, allergies, PMH, social hx, family hx, and changes were documented in the history of present illness. Otherwise, unchanged from my initial visit note.  Past Medical History:  Diagnosis Date   Acute pancreatitis    Constipation    Gastritis    GERD (gastroesophageal reflux disease)    GI work up in progress   Hashimoto's thyroiditis    Herpes    Mass on back    Obesity    PONV (postoperative nausea and vomiting)    Rheumatoid arthritis ([place])    Past Surgical History:  Procedure Laterality Date   COLONOSCOPY     DILITATION & CURRETTAGE/HYSTROSCOPY WITH HYDROTHERMAL ABLATION N/A 12/26/2016   Procedure: DILATATION & CURETTAGE/HYSTEROSCOPY WITH HYDROTHERMAL ABLATION;  Surgeon: Dian Queen, MD;  Location: [place];  Service: Gynecology;  Laterality: N/A;   MASS EXCISION Right 02/23/2018   Procedure: EXCISION BACK MASS ERAS PATHWAY;  Surgeon: Rolm Bookbinder, MD;  Location: [place];  Service: General;  Laterality: Right;   POLYPECTOMY     TONSILLECTOMY AND ADENOIDECTOMY     TYMPANOSTOMY TUBE PLACEMENT     Social History   Socioeconomic History   Marital status: Married    Spouse name: Not on file   Number of children: 1  Social Needs  Occupational History   [employer] - [employer]  Tobacco Use   Smoking status: Never  Smoker   Smokeless tobacco: Never Used  Substance and Sexual Activity   Alcohol use: No   Drug use: No   Current Outpatient Medications on File Prior to Visit  Medication Sig Dispense Refill   esomeprazole (NEXIUM) 40 MG capsule TAKE 1 CAPSULE (40 MG TOTAL) BY MOUTH DAILY. (Patient taking differently: Take by mouth as needed.) 30 capsule 1   Magnesium Gluconate 550 MG TABS Take by mouth.     mesalamine (LIALDA) 1.2 g EC tablet Take 4 tablets (4.8 g total) by mouth daily with breakfast. 120 tablet 3   Multiple Vitamin (  MULTI-VITAMIN) tablet Take 1 tablet by mouth daily.     Omega-3 1000 MG CAPS Take by mouth.     Turmeric 400 MG CAPS See admin instructions.     valACYclovir (VALTREX) 1000 MG tablet Take 1 tablet by mouth twice daily for 10 days 20 tablet 1   valACYclovir (VALTREX) 1000 MG tablet Take 1 tablet by mouth once daily 90 tablet 1   valACYclovir (VALTREX) 500 MG tablet Take 1 tablet by mouth once a day 90 tablet 1   valACYclovir (VALTREX) 500 MG tablet Take 1 tablet by mouth twice a day 180 tablet 0   Vitamin D, Ergocalciferol, 50 MCG (2000 UT) CAPS 1 capsule     zolpidem (AMBIEN) 10 MG tablet TAKE 1 TABLET BY MOUTH EVERY NIGHT AT BEDTIME 30 tablet 3   No current facility-administered medications on file prior to visit.   Allergies  Allergen Reactions   Doxycycline Nausea And Vomiting   Flulaval Quadrivalent [Influenza Vac Split Quad] Other (See Comments)    Weakness and don't feel good from the vaccine   Hydroxychloroquine Other (See Comments)    Nausea/malaise/reflux    Family History  Problem Relation Age of Onset   Deep vein thrombosis Mother    Colon polyps Mother    Heart disease Mother    Gastric cancer Father    Diabetes Father    Heart disease Father    Hypertension Father    Stomach cancer Father    Aortic stenosis Son    Breast cancer Maternal Aunt    Diabetes Paternal Grandmother    Heart disease Paternal Grandmother    Colon cancer Neg Hx     Allergic rhinitis Neg Hx    Asthma Neg Hx    Angioedema Neg Hx    Eczema Neg Hx    Urticaria Neg Hx    Rectal cancer Neg Hx    Esophageal cancer Neg Hx     PE: BP 118/72 (BP Location: Right Arm, Patient Position: Sitting, Cuff Size: Large)   Pulse 74   Ht 5\' 3"  (1.6 m)   Wt 179 lb 6.4 oz (81.4 kg)   SpO2 97%   BMI 31.78 kg/m  Wt Readings from Last 3 Encounters:  07/04/21 179 lb 6.4 oz (81.4 kg)  06/04/21 175 lb (79.4 kg)  05/29/21 175 lb (79.4 kg)   Constitutional: overweight, in NAD Eyes: PERRLA, EOMI, no exophthalmos ENT: moist mucous membranes, no thyromegaly, no cervical lymphadenopathy Cardiovascular: RRR, No MRG Respiratory: CTA B Musculoskeletal: no deformities, strength intact in all 4 Skin: moist, warm, no rashes Neurological: no tremor with outstretched hands, DTR normal in all 4  ASSESSMENT: 1. Low cortisol level  2.  Euthyroid Hashimoto's thyroiditis  3.  Vitamin D deficiency  PLAN:  1. Low cortisol level -Patient with history of fatigue, abdominal pain, and a slightly low a.m. cortisol in 03/2017 -She remembers feeling great after 10 mg of dexamethasone which was given at the time of her endometrial ablation in 12/2016.  We discussed that is not unusual, even in patients without adrenal insufficiency -Her adrenals appeared normal on review of the CT scan reports from 12/2016 and 05/2021 -A cosyntropin stimulation test was normal in 2019 -At this visit, no unintentional weight loss (she lost 11 pounds in the last 4 months, intentionally), anorexia, nausea, or significant fatigue to point towards adrenal insufficiency  2. Euthyroid Hashimoto's thyroiditis -Diagnosed based on elevated TPO antibodies but at last visit her thyroglobulin antibodies were also elevated.  She  continues on selenium supplements. -Latest TFTs were normal in 06/2020 and in 02/2021 -We did discuss in the past about treatment of Hashimoto's thyroiditis which is actually limited to  thyroid hormones in case thyroid function tests are normal.  Supplements like selenium have been tried with various results, some showing improvement in the antithyroid antibodies.  Indeed, her TPO antibodies have improved at last visit and they were normal.  Thyroglobulin antibodies were slightly higher at that time. -She continues on multivitamin with 150 mcg of selenium, and she started to feel much better after starting this.  Also, she started to be much better after starting intermittent fasting this summer. She also lost weight. -At today's visit we will repeat her TFTs and antibody levels.  3.  Vitamin D deficiency -joint pains resolved after he changed her diet -The latest level reviewed and this was normal 2 years ago -She continues on 2000 units vitamin D daily but she just started it a week ago. -will recheck this today per her request  Component     Latest Ref Rng & Units 07/04/2021  TSH     0.35 - 5.50 uIU/mL 1.76  Thyroglobulin Ab     < or = 1 IU/mL 4 (H)  Triiodothyronine,Free,Serum     2.3 - 4.2 pg/mL 3.1  T4,Free(Direct)     0.60 - 1.60 ng/dL 0.94  VITD     30.00 - 100.00 ng/mL 28.55 (L)  Thyroperoxidase Ab SerPl-aCnc     <9 IU/mL 2  Vitamin D is slightly under the lower limit of normal.  Since he just started the 2000 units of vitamin D daily, I would just advise him to continue the same dose for now.  TPO antibodies are not elevated.  Thyroglobulin antibodies are improved.  TFTs are excellent.  Philemon Kingdom, MD PhD [facility]

## 2021-07-05 LAB — THYROGLOBULIN ANTIBODY: Thyroglobulin Ab: 4 IU/mL — ABNORMAL HIGH (ref <=1)

## 2021-07-05 LAB — THYROID PEROXIDASE ANTIBODY: Thyroperoxidase Ab SerPl-aCnc: 2 IU/mL (ref <9)

## 2021-07-24 ENCOUNTER — Other Ambulatory Visit: Payer: Self-pay | Admitting: Family Medicine

## 2021-07-24 DIAGNOSIS — N644 Mastodynia: Secondary | ICD-10-CM

## 2021-07-24 DIAGNOSIS — N649 Disorder of breast, unspecified: Secondary | ICD-10-CM | POA: Diagnosis not present

## 2021-08-01 ENCOUNTER — Ambulatory Visit
Admission: RE | Admit: 2021-08-01 | Discharge: 2021-08-01 | Disposition: A | Discharge status: Home / Self Care | Payer: 59 | Source: Ambulatory Visit | Attending: Family Medicine | Admitting: Family Medicine

## 2021-08-01 DIAGNOSIS — N644 Mastodynia: Secondary | ICD-10-CM | POA: Diagnosis not present

## 2021-08-01 DIAGNOSIS — R928 Other abnormal and inconclusive findings on diagnostic imaging of breast: Secondary | ICD-10-CM | POA: Diagnosis not present

## 2021-08-06 ENCOUNTER — Other Ambulatory Visit (HOSPITAL_COMMUNITY): Payer: Self-pay

## 2021-08-06 MED ORDER — VALACYCLOVIR HCL 1 G PO TABS
1000.0000 mg | ORAL_TABLET | Freq: Two times a day (BID) | ORAL | 0 refills | Status: DC
  Filled 2021-08-06: qty 20, 10d supply, fill #0

## 2021-08-12 ENCOUNTER — Ambulatory Visit: Payer: 59 | Admitting: Gastroenterology

## 2021-08-28 ENCOUNTER — Ambulatory Visit: Payer: 59 | Admitting: Gastroenterology

## 2021-09-03 ENCOUNTER — Other Ambulatory Visit (HOSPITAL_COMMUNITY): Payer: Self-pay

## 2021-09-03 DIAGNOSIS — U071 COVID-19: Secondary | ICD-10-CM | POA: Diagnosis not present

## 2021-09-03 MED ORDER — PAXLOVID (300/100) 20 X 150 MG & 10 X 100MG PO TBPK
ORAL_TABLET | ORAL | 0 refills | Status: DC
  Filled 2021-09-03: qty 30, 5d supply, fill #0

## 2021-09-03 MED ORDER — CARESTART COVID-19 HOME TEST VI KIT
PACK | 0 refills | Status: DC
  Filled 2021-09-03: qty 4, 4d supply, fill #0

## 2021-09-18 ENCOUNTER — Other Ambulatory Visit (HOSPITAL_COMMUNITY): Payer: Self-pay

## 2021-09-19 ENCOUNTER — Other Ambulatory Visit (HOSPITAL_COMMUNITY): Payer: Self-pay

## 2021-09-19 MED ORDER — VALACYCLOVIR HCL 500 MG PO TABS
ORAL_TABLET | ORAL | 1 refills | Status: DC
  Filled 2021-09-19: qty 180, 90d supply, fill #0
  Filled 2022-01-02: qty 180, 90d supply, fill #1

## 2021-09-20 ENCOUNTER — Other Ambulatory Visit (HOSPITAL_COMMUNITY): Payer: Self-pay

## 2021-09-25 ENCOUNTER — Encounter: Payer: Self-pay | Admitting: Gastroenterology

## 2021-09-25 ENCOUNTER — Ambulatory Visit: Payer: 59 | Admitting: Gastroenterology

## 2021-09-25 VITALS — BP 118/78 | HR 78 | Ht 64.0 in | Wt 179.0 lb

## 2021-09-25 DIAGNOSIS — K5790 Diverticulosis of intestine, part unspecified, without perforation or abscess without bleeding: Secondary | ICD-10-CM

## 2021-09-25 NOTE — Patient Instructions (Signed)
If you are age 57 or younger, your body mass index should be between 19-25. Your Body mass index is 30.73 kg/m. If this is out of the aformentioned range listed, please consider follow up with your Primary Care Provider.  ________________________________________________________  The [facility] providers would like to encourage you to use [facility] to communicate with providers for non-urgent requests or questions.  Due to long hold times on the telephone, sending your provider a message by [facility] may be a faster and more efficient way to get a response.  Please allow 48 business hours for a response.  Please remember that this is for non-urgent requests.  _______________________________________________________  Robin Maynard will follow up in our office on an as needed basis.  Thank you for entrusting me with your care and choosing [facility].  Dr Ardis Hughs

## 2021-09-25 NOTE — Progress Notes (Signed)
Review of pertinent gastrointestinal problems: 1.  Chronic intermittent abdominal pains.  CT scan abdomen pelvis with IV and oral contrast October 2022 was fairly normal except for high stool burden, sigmoid diverticulosis.  CT scan abdomen pelvis with IV and oral contrast June 2022 suggested "focal area of mild wall thickening with adjacent inflammatory stranding around the sigmoid colon" she was put on Cipro and Flagyl.  Colonoscopy 06/2021 multiple diverticulosis left colon, mucosa slightly edematous and a bit tortuous.  Examination was otherwise normal.  The colon was randomly biopsied.  Biopsies were all completely normal.  She was started empirically on Lialda 4 pills once daily for possible diverticulosis related edema, inflammation. 2.  Adenomatous colon polyps.  Colonoscopy September 2019 for routine risk screening found 2 subcentimeter polyps.  One of them was precancerous, adenoma.    HPI: This is a very pleasant 56 year old woman whom I last saw the time of a colonoscopy.  See the results summarized above.  She took 4 Lialda pills a day for about 6 or 7 weeks.  She stopped.  She was actually feeling better even before he started taking those pills.  She has been feeling quite a bit better since some significant diet changes that she made over the fall.  Specifically she has been fasting, she has been completely avoiding sugar in her diet, she has decreased her red meat intake.   ROS: complete GI ROS as described in HPI, all other review negative.  Constitutional:  No unintentional weight loss   Past Medical History:  Diagnosis Date   Acute pancreatitis    Constipation    Gastritis    GERD (gastroesophageal reflux disease)    GI work up in progress   Hashimoto's thyroiditis    Herpes    Mass on back    Obesity    PONV (postoperative nausea and vomiting)    Rheumatoid arthritis ([place])     Past Surgical History:  Procedure Laterality Date   COLONOSCOPY     DILITATION &  CURRETTAGE/HYSTROSCOPY WITH HYDROTHERMAL ABLATION N/A 12/26/2016   Procedure: DILATATION & CURETTAGE/HYSTEROSCOPY WITH HYDROTHERMAL ABLATION;  Surgeon: Dian Queen, MD;  Location: [place];  Service: Gynecology;  Laterality: N/A;   MASS EXCISION Right 02/23/2018   Procedure: EXCISION BACK MASS ERAS PATHWAY;  Surgeon: Rolm Bookbinder, MD;  Location: [place];  Service: General;  Laterality: Right;   POLYPECTOMY     TONSILLECTOMY AND ADENOIDECTOMY     TYMPANOSTOMY TUBE PLACEMENT      Current Outpatient Medications  Medication Sig Dispense Refill   Magnesium Gluconate 550 MG TABS Take 550 mg by mouth. 2 to 3 times a week     Multiple Vitamin (MULTI-VITAMIN) tablet Take 1 tablet by mouth daily.     Omega-3 1000 MG CAPS Take by mouth.     Turmeric 400 MG CAPS See admin instructions.     valACYclovir (VALTREX) 1000 MG tablet Take 1 tablet (1,000 mg total) by mouth 2 (two) times daily for 10 days (Patient taking differently: Take 500 mg by mouth daily. If needed, increase to 2 tablets (1000 mg)) 20 tablet 0   valACYclovir (VALTREX) 500 MG tablet Take 1 tablet by mouth once a day 90 tablet 1   valACYclovir (VALTREX) 500 MG tablet Take 1 tablet by mouth twice a day 180 tablet 1   Vitamin D, Ergocalciferol, 50 MCG (2000 UT) CAPS 1 capsule     zolpidem (AMBIEN) 10 MG tablet TAKE 1 TABLET BY MOUTH EVERY  NIGHT AT BEDTIME (Patient taking differently: Take 10 mg by mouth at bedtime as needed.) 30 tablet 3   esomeprazole (NEXIUM) 40 MG capsule TAKE 1 CAPSULE (40 MG TOTAL) BY MOUTH DAILY. (Patient taking differently: Take by mouth as needed.) 30 capsule 1   No current facility-administered medications for this visit.    Allergies as of 09/25/2021 - Review Complete 09/25/2021  Allergen Reaction Noted   Doxycycline Nausea And Vomiting 06/21/2019   Flulaval quadrivalent [influenza vac split quad] Other (See Comments) 10/31/2019   Hydroxychloroquine Other (See  Comments) 08/16/2020    Family History  Problem Relation Age of Onset   Deep vein thrombosis Mother    Colon polyps Mother    Heart disease Mother    Gastric cancer Father    Diabetes Father    Heart disease Father    Hypertension Father    Stomach cancer Father    Aortic stenosis Son    Breast cancer Maternal Aunt    Diabetes Paternal Grandmother    Heart disease Paternal Grandmother    Colon cancer Neg Hx    Allergic rhinitis Neg Hx    Asthma Neg Hx    Angioedema Neg Hx    Eczema Neg Hx    Urticaria Neg Hx    Rectal cancer Neg Hx    Esophageal cancer Neg Hx     Social History   Socioeconomic History   Marital status: Married    Spouse name: Not on file   Number of children: Not on file   Years of education: Not on file   Highest education level: Not on file  Occupational History   Not on file  Tobacco Use   Smoking status: Never   Smokeless tobacco: Never  Vaping Use   Vaping Use: Never used  Substance and Sexual Activity   Alcohol use: No   Drug use: No   Sexual activity: Not on file    Comment: ablation  Other Topics Concern   Not on file  Social History Narrative   Married since 1985.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   Social Determinants of Health   Financial Resource Strain: Not on file  Food Insecurity: Not on file  Transportation Needs: Not on file  Physical Activity: Not on file  Stress: Not on file  Social Connections: Not on file  Intimate Partner Violence: Not on file     Physical Exam: BP 118/78    Pulse 78    Ht 5\' 4"  (1.626 m)    Wt 179 lb (81.2 kg)    SpO2 97%    BMI 30.73 kg/m  Constitutional: generally well-appearing Psychiatric: alert and oriented x3 Abdomen: soft, nontender, nondistended, no obvious ascites, no peritoneal signs, normal bowel sounds No peripheral edema noted in lower extremities  Assessment and plan: 56 y.o. female with diverticulosis, history of diverticulitis, possibly diverticular associated  colitis  She is overall feeling so much better from many perspective.  GI wise, energy wise, joint wise.  I do believe that these are the result of some significant diet changes that she made over the fall.  She has been fasting intermittently, she has been almost completely avoiding sugar.  She has significantly cut back her red meat intake.  She is quite excited about how much better she has been feeling and I am excited for her as well.  She knows to call if she has any significant new GI issues for which she contracted out of the hospital, we work together and I  have always enjoyed our relationship.  Please see the "Patient Instructions" section for addition details about the plan.  Owens Loffler, MD [facility] 09/25/2021, 9:29 AM   Total time on date of encounter was 20 minutes (this included time spent preparing to see the patient reviewing records; obtaining and/or reviewing separately obtained history; performing a medically appropriate exam and/or evaluation; counseling and educating the patient and family if present; ordering medications, tests or procedures if applicable; and documenting clinical information in the health record).

## 2021-10-08 DIAGNOSIS — H9201 Otalgia, right ear: Secondary | ICD-10-CM | POA: Diagnosis not present

## 2021-10-08 DIAGNOSIS — H9313 Tinnitus, bilateral: Secondary | ICD-10-CM | POA: Diagnosis not present

## 2021-11-18 ENCOUNTER — Other Ambulatory Visit (HOSPITAL_COMMUNITY): Payer: Self-pay

## 2021-11-20 ENCOUNTER — Other Ambulatory Visit (HOSPITAL_COMMUNITY): Payer: Self-pay

## 2021-11-20 MED ORDER — VALACYCLOVIR HCL 1 G PO TABS
ORAL_TABLET | ORAL | 0 refills | Status: DC
  Filled 2021-11-20: qty 20, 10d supply, fill #0

## 2021-12-12 ENCOUNTER — Other Ambulatory Visit (HOSPITAL_COMMUNITY): Payer: Self-pay

## 2021-12-12 DIAGNOSIS — M19072 Primary osteoarthritis, left ankle and foot: Secondary | ICD-10-CM | POA: Diagnosis not present

## 2021-12-12 DIAGNOSIS — M898X7 Other specified disorders of bone, ankle and foot: Secondary | ICD-10-CM | POA: Diagnosis not present

## 2021-12-12 DIAGNOSIS — H04123 Dry eye syndrome of bilateral lacrimal glands: Secondary | ICD-10-CM | POA: Diagnosis not present

## 2021-12-12 DIAGNOSIS — Z8616 Personal history of COVID-19: Secondary | ICD-10-CM | POA: Diagnosis not present

## 2021-12-12 DIAGNOSIS — M2012 Hallux valgus (acquired), left foot: Secondary | ICD-10-CM | POA: Diagnosis not present

## 2021-12-12 DIAGNOSIS — M19071 Primary osteoarthritis, right ankle and foot: Secondary | ICD-10-CM | POA: Diagnosis not present

## 2021-12-12 DIAGNOSIS — R519 Headache, unspecified: Secondary | ICD-10-CM | POA: Diagnosis not present

## 2021-12-12 DIAGNOSIS — M130 Polyarthritis, unspecified: Secondary | ICD-10-CM | POA: Diagnosis not present

## 2021-12-12 DIAGNOSIS — E063 Autoimmune thyroiditis: Secondary | ICD-10-CM | POA: Diagnosis not present

## 2021-12-12 DIAGNOSIS — M2011 Hallux valgus (acquired), right foot: Secondary | ICD-10-CM | POA: Diagnosis not present

## 2021-12-12 MED ORDER — CELECOXIB 100 MG PO CAPS
ORAL_CAPSULE | ORAL | 0 refills | Status: DC
  Filled 2021-12-12: qty 60, 30d supply, fill #0

## 2021-12-18 ENCOUNTER — Other Ambulatory Visit (HOSPITAL_COMMUNITY): Payer: Self-pay

## 2021-12-18 MED ORDER — SUMATRIPTAN SUCCINATE 50 MG PO TABS
ORAL_TABLET | ORAL | 0 refills | Status: DC
  Filled 2021-12-18: qty 2, 4d supply, fill #0

## 2021-12-19 ENCOUNTER — Other Ambulatory Visit: Payer: Self-pay | Admitting: Family Medicine

## 2021-12-19 DIAGNOSIS — Z1231 Encounter for screening mammogram for malignant neoplasm of breast: Secondary | ICD-10-CM

## 2021-12-22 ENCOUNTER — Encounter: Payer: Self-pay | Admitting: Gastroenterology

## 2021-12-23 ENCOUNTER — Other Ambulatory Visit: Payer: Self-pay

## 2021-12-23 ENCOUNTER — Other Ambulatory Visit (HOSPITAL_COMMUNITY): Payer: Self-pay

## 2021-12-23 MED ORDER — CIPROFLOXACIN HCL 500 MG PO TABS
500.0000 mg | ORAL_TABLET | Freq: Two times a day (BID) | ORAL | 0 refills | Status: AC
  Filled 2021-12-23: qty 10, 5d supply, fill #0

## 2021-12-23 MED ORDER — METRONIDAZOLE 500 MG PO TABS
500.0000 mg | ORAL_TABLET | Freq: Two times a day (BID) | ORAL | 0 refills | Status: AC
  Filled 2021-12-23: qty 10, 5d supply, fill #0

## 2021-12-27 ENCOUNTER — Other Ambulatory Visit (HOSPITAL_COMMUNITY): Payer: Self-pay

## 2021-12-27 DIAGNOSIS — Z6831 Body mass index (BMI) 31.0-31.9, adult: Secondary | ICD-10-CM | POA: Diagnosis not present

## 2021-12-27 DIAGNOSIS — N950 Postmenopausal bleeding: Secondary | ICD-10-CM | POA: Diagnosis not present

## 2021-12-27 DIAGNOSIS — Z760 Encounter for issue of repeat prescription: Secondary | ICD-10-CM | POA: Diagnosis not present

## 2021-12-27 DIAGNOSIS — Z01419 Encounter for gynecological examination (general) (routine) without abnormal findings: Secondary | ICD-10-CM | POA: Diagnosis not present

## 2021-12-27 MED ORDER — ZOLPIDEM TARTRATE 10 MG PO TABS
10.0000 mg | ORAL_TABLET | Freq: Every day | ORAL | 3 refills | Status: DC
  Filled 2021-12-27: qty 30, 30d supply, fill #0
  Filled 2022-03-10: qty 30, 30d supply, fill #1
  Filled 2022-05-16: qty 30, 30d supply, fill #2

## 2022-01-01 ENCOUNTER — Ambulatory Visit
Admission: RE | Admit: 2022-01-01 | Discharge: 2022-01-01 | Disposition: A | Discharge status: Home / Self Care | Payer: 59 | Source: Ambulatory Visit | Attending: Family Medicine | Admitting: Family Medicine

## 2022-01-01 DIAGNOSIS — Z1231 Encounter for screening mammogram for malignant neoplasm of breast: Secondary | ICD-10-CM

## 2022-01-03 ENCOUNTER — Other Ambulatory Visit (HOSPITAL_COMMUNITY): Payer: Self-pay

## 2022-01-09 ENCOUNTER — Other Ambulatory Visit (HOSPITAL_COMMUNITY): Payer: Self-pay

## 2022-01-10 DIAGNOSIS — H524 Presbyopia: Secondary | ICD-10-CM | POA: Diagnosis not present

## 2022-01-10 DIAGNOSIS — H52223 Regular astigmatism, bilateral: Secondary | ICD-10-CM | POA: Diagnosis not present

## 2022-01-10 DIAGNOSIS — H5213 Myopia, bilateral: Secondary | ICD-10-CM | POA: Diagnosis not present

## 2022-01-24 ENCOUNTER — Other Ambulatory Visit (HOSPITAL_COMMUNITY): Payer: Self-pay

## 2022-01-24 DIAGNOSIS — M79643 Pain in unspecified hand: Secondary | ICD-10-CM | POA: Diagnosis not present

## 2022-01-24 DIAGNOSIS — R5383 Other fatigue: Secondary | ICD-10-CM | POA: Diagnosis not present

## 2022-01-24 DIAGNOSIS — R760 Raised antibody titer: Secondary | ICD-10-CM | POA: Diagnosis not present

## 2022-01-24 DIAGNOSIS — M19041 Primary osteoarthritis, right hand: Secondary | ICD-10-CM | POA: Diagnosis not present

## 2022-01-24 DIAGNOSIS — Z8249 Family history of ischemic heart disease and other diseases of the circulatory system: Secondary | ICD-10-CM | POA: Diagnosis not present

## 2022-01-24 DIAGNOSIS — M25551 Pain in right hip: Secondary | ICD-10-CM | POA: Diagnosis not present

## 2022-01-24 DIAGNOSIS — M1990 Unspecified osteoarthritis, unspecified site: Secondary | ICD-10-CM | POA: Diagnosis not present

## 2022-01-24 DIAGNOSIS — M2550 Pain in unspecified joint: Secondary | ICD-10-CM | POA: Diagnosis not present

## 2022-01-24 DIAGNOSIS — R519 Headache, unspecified: Secondary | ICD-10-CM | POA: Diagnosis not present

## 2022-01-24 DIAGNOSIS — M19042 Primary osteoarthritis, left hand: Secondary | ICD-10-CM | POA: Diagnosis not present

## 2022-01-24 DIAGNOSIS — Z840 Family history of diseases of the skin and subcutaneous tissue: Secondary | ICD-10-CM | POA: Diagnosis not present

## 2022-01-24 MED ORDER — PREDNISONE 5 MG PO TABS
ORAL_TABLET | ORAL | 0 refills | Status: DC
  Filled 2022-01-24: qty 18, 9d supply, fill #0

## 2022-01-24 MED ORDER — CELECOXIB 50 MG PO CAPS
ORAL_CAPSULE | ORAL | 1 refills | Status: AC
  Filled 2022-01-24: qty 60, 15d supply, fill #0
  Filled 2022-11-03: qty 60, 15d supply, fill #1

## 2022-01-27 ENCOUNTER — Other Ambulatory Visit (HOSPITAL_COMMUNITY): Payer: Self-pay

## 2022-01-28 ENCOUNTER — Other Ambulatory Visit (HOSPITAL_COMMUNITY): Payer: Self-pay

## 2022-01-28 MED ORDER — VALACYCLOVIR HCL 1 G PO TABS
ORAL_TABLET | ORAL | 0 refills | Status: DC
  Filled 2022-01-28: qty 20, 10d supply, fill #0

## 2022-02-27 DIAGNOSIS — M26629 Arthralgia of temporomandibular joint, unspecified side: Secondary | ICD-10-CM | POA: Diagnosis not present

## 2022-02-27 DIAGNOSIS — H93291 Other abnormal auditory perceptions, right ear: Secondary | ICD-10-CM | POA: Diagnosis not present

## 2022-03-06 ENCOUNTER — Other Ambulatory Visit (HOSPITAL_COMMUNITY): Payer: Self-pay

## 2022-03-06 MED ORDER — PREDNISONE 5 MG PO TABS
ORAL_TABLET | ORAL | 0 refills | Status: DC
  Filled 2022-03-06: qty 30, 16d supply, fill #0

## 2022-03-10 ENCOUNTER — Other Ambulatory Visit (HOSPITAL_COMMUNITY): Payer: Self-pay

## 2022-03-12 DIAGNOSIS — Z0289 Encounter for other administrative examinations: Secondary | ICD-10-CM

## 2022-03-19 ENCOUNTER — Other Ambulatory Visit (HOSPITAL_COMMUNITY): Payer: Self-pay

## 2022-04-01 ENCOUNTER — Encounter (INDEPENDENT_AMBULATORY_CARE_PROVIDER_SITE_OTHER): Payer: Self-pay | Admitting: Family Medicine

## 2022-04-01 ENCOUNTER — Ambulatory Visit (INDEPENDENT_AMBULATORY_CARE_PROVIDER_SITE_OTHER): Payer: 59 | Admitting: Family Medicine

## 2022-04-01 VITALS — BP 99/66 | HR 65 | Temp 97.7°F | Ht 64.0 in | Wt 176.0 lb

## 2022-04-01 DIAGNOSIS — R0602 Shortness of breath: Secondary | ICD-10-CM | POA: Diagnosis not present

## 2022-04-01 DIAGNOSIS — R7303 Prediabetes: Secondary | ICD-10-CM | POA: Diagnosis not present

## 2022-04-01 DIAGNOSIS — M069 Rheumatoid arthritis, unspecified: Secondary | ICD-10-CM

## 2022-04-01 DIAGNOSIS — E669 Obesity, unspecified: Secondary | ICD-10-CM

## 2022-04-01 DIAGNOSIS — Z1331 Encounter for screening for depression: Secondary | ICD-10-CM | POA: Diagnosis not present

## 2022-04-01 DIAGNOSIS — Z6830 Body mass index (BMI) 30.0-30.9, adult: Secondary | ICD-10-CM | POA: Diagnosis not present

## 2022-04-01 DIAGNOSIS — R5383 Other fatigue: Secondary | ICD-10-CM | POA: Diagnosis not present

## 2022-04-01 DIAGNOSIS — K219 Gastro-esophageal reflux disease without esophagitis: Secondary | ICD-10-CM

## 2022-04-08 ENCOUNTER — Encounter: Payer: Self-pay | Admitting: Internal Medicine

## 2022-04-08 LAB — HEMOGLOBIN A1C
Est. average glucose Bld gHb Est-mCnc: 131 mg/dL
Hgb A1c MFr Bld: 6.2 % — ABNORMAL HIGH (ref 4.8–5.6)

## 2022-04-08 LAB — LIPID PANEL
Chol/HDL Ratio: 4.3 ratio (ref 0.0–4.4)
Cholesterol, Total: 264 mg/dL — ABNORMAL HIGH (ref 100–199)
HDL: 61 mg/dL (ref >39)
LDL Chol Calc (NIH): 178 mg/dL — ABNORMAL HIGH (ref 0–99)
Triglycerides: 139 mg/dL (ref 0–149)
VLDL Cholesterol Cal: 25 mg/dL (ref 5–40)

## 2022-04-08 LAB — TSH RFX ON ABNORMAL TO FREE T4: TSH: 1.18 u[IU]/mL (ref 0.450–4.500)

## 2022-04-08 LAB — INSULIN, RANDOM: INSULIN: 6.8 u[IU]/mL (ref 2.6–24.9)

## 2022-04-08 LAB — VITAMIN D, 25-HYDROXY, TOTAL: Vitamin D, 25-Hydroxy, Serum: 44 ng/mL

## 2022-04-09 ENCOUNTER — Other Ambulatory Visit: Payer: Self-pay | Admitting: Internal Medicine

## 2022-04-09 DIAGNOSIS — E348 Other specified endocrine disorders: Secondary | ICD-10-CM

## 2022-04-15 ENCOUNTER — Ambulatory Visit (INDEPENDENT_AMBULATORY_CARE_PROVIDER_SITE_OTHER): Payer: 59 | Admitting: Family Medicine

## 2022-04-15 ENCOUNTER — Encounter (INDEPENDENT_AMBULATORY_CARE_PROVIDER_SITE_OTHER): Payer: Self-pay | Admitting: Family Medicine

## 2022-04-15 VITALS — BP 146/83 | HR 67 | Temp 97.8°F | Ht 64.0 in | Wt 173.0 lb

## 2022-04-15 DIAGNOSIS — E669 Obesity, unspecified: Secondary | ICD-10-CM | POA: Diagnosis not present

## 2022-04-15 DIAGNOSIS — E7849 Other hyperlipidemia: Secondary | ICD-10-CM | POA: Diagnosis not present

## 2022-04-15 DIAGNOSIS — R7303 Prediabetes: Secondary | ICD-10-CM | POA: Diagnosis not present

## 2022-04-15 DIAGNOSIS — E559 Vitamin D deficiency, unspecified: Secondary | ICD-10-CM

## 2022-04-15 DIAGNOSIS — Z6829 Body mass index (BMI) 29.0-29.9, adult: Secondary | ICD-10-CM | POA: Diagnosis not present

## 2022-04-15 DIAGNOSIS — M069 Rheumatoid arthritis, unspecified: Secondary | ICD-10-CM | POA: Diagnosis not present

## 2022-04-15 NOTE — Progress Notes (Signed)
Chief Complaint:   OBESITY Robin Maynard (MR# [id number]) is a 56 y.o. female who presents for evaluation and treatment of obesity and related comorbidities. Current BMI is Body mass index is 30.21 kg/m. Robin Maynard has been struggling with her weight for many years and has been unsuccessful in either losing weight, maintaining weight loss, or reaching her healthy weight goal.  Robin Maynard has been overweight on and off since childhood.  She has a family history of overweight with her mom.  Struggles with overeating and chooses to skip breakfast.  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  Eats dinner with her husband.  Has more cravings at night, salty or sweets.  Denies ETOH or sugar sweetened beverages.  Never used AOM.  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  Robin Maynard is currently in the action stage of change and ready to dedicate time achieving and maintaining a healthier weight. Robin Maynard is interested in becoming our patient and working on intensive lifestyle modifications including (but not limited to) diet and exercise for weight loss.  Robin Maynard's habits were reviewed today and are as follows: Her family eats meals together, she thinks her family will eat healthier with her, she struggles with family and or coworkers weight loss sabotage, her desired weight loss is 46 lbs, she has been heavy most of her life, she started gaining weight in her 71's, her heaviest weight ever was 186 pounds, she has significant food cravings issues, she snacks frequently in the evenings, she wakes up frequently in the middle of the night to eat, she skips meals frequently, she is frequently drinking liquids with calories, she frequently makes poor food choices, she has problems with excessive hunger, she frequently eats larger portions than normal, and she struggles with emotional eating.  Depression Screen Robin Maynard's Food and Mood (modified PHQ-9) score was 5.     04/01/2022    9:56 AM  Depression screen PHQ  2/9  Decreased Interest 1  Down, Depressed, Hopeless 1  PHQ - 2 Score 2  Altered sleeping 0  Tired, decreased energy 1  Change in appetite 2  Feeling bad or failure about yourself  0  Trouble concentrating 0  Moving slowly or fidgety/restless 0  Suicidal thoughts 0  PHQ-9 Score 5  Difficult doing work/chores Not difficult at all   Subjective:   1. Other fatigue Robin Maynard admits to daytime somnolence and admits to waking up still tired. Patient has a history of symptoms of daytime fatigue, morning fatigue, and morning headache. Robin Maynard generally gets 4 hours of sleep per night, and states that she has nightime awakenings. Snoring is present. Apneic episodes are not present. Epworth Sleepiness Score is 4.   2. SOBOE (shortness of breath on exertion) Robin Maynard notes increasing shortness of breath with exercising and seems to be worsening over time with weight gain. She notes getting out of breath sooner with activity than she used to. This has not gotten worse recently. Robin Maynard denies shortness of breath at rest or orthopnea.  3. Rheumatoid arthritis, involving unspecified site, unspecified whether rheumatoid factor present (Robin Maynard) Robin Maynard is seeing Dr. Manuella Ghazi at [facility].  She has needed a few rounds of prednisone that contributed to polyphagia.  4. Prediabetes Robin Maynard's last A1c was 6.0 on 02/19/2021.  She denies consumption of excess sugar.  Remains physically active.  5. Gastroesophageal reflux disease, unspecified whether esophagitis present Robin Maynard is on Nexium 40 mg daily.  Limits late-night eating.  Assessment/Plan:   1. Other fatigue  Robin Maynard does feel that her weight is causing her energy to be lower than it should be. Fatigue may be related to obesity, depression or many other causes. Labs will be ordered, and in the meanwhile, Robin Maynard will focus on self care including making healthy food choices, increasing physical activity and focusing on stress reduction.  - EKG 12-Lead - Lipid  panel - Vitamin D, 25-Hydroxy, Total - TSH Rfx on Abnormal to Free T4  2. SOBOE (shortness of breath on exertion) Robin Maynard does feel that she gets out of breath more easily that she used to when she exercises. Robin Maynard's shortness of breath appears to be obesity related and exercise induced. She has agreed to work on weight loss and gradually increase exercise to treat her exercise induced shortness of breath. Will continue to monitor closely.  3. Rheumatoid arthritis, involving unspecified site, unspecified whether rheumatoid factor present [facility]) Robin Maynard will continue her plan of care per Rheumatology.  4. Prediabetes We will check labs today, and we will follow-up at [place] next visit.  - Hemoglobin A1c - Insulin, random  5. Gastroesophageal reflux disease, unspecified whether esophagitis present Look for improvements in GERD with weight loss.  6. Depression screening Robin Maynard had a positive depression screening. Depression is commonly associated with obesity and often results in emotional eating behaviors. We will monitor this closely and work on CBT to help improve the non-hunger eating patterns. Referral to Psychology may be required if no improvement is seen as she continues in our clinic.  7. Obesity, current BMI 30.3 Robin Maynard is currently in the action stage of change and her goal is to continue with weight loss efforts. I recommend Robin Maynard begin the structured treatment plan as follows:  She has agreed to the Category 2 Plan.  Exercise goals: As is.    Behavioral modification strategies: increasing lean protein intake, increasing vegetables, increasing water intake, decreasing eating out, no skipping meals, better snacking choices, and decreasing junk food.  She was informed of the importance of frequent follow-up visits to maximize her success with intensive lifestyle modifications for her multiple health conditions. She was informed we would discuss her lab results at her next visit  unless there is a critical issue that needs to be addressed sooner. Robin Maynard agreed to keep her next visit at the agreed upon time to discuss these results.  Objective:   Blood pressure 99/66, pulse 65, temperature 97.7 F (36.5 C), height '5\' 4"'$  (1.626 m), weight 176 lb (79.8 kg), SpO2 95 %. Body mass index is 30.21 kg/m.  EKG: Normal sinus rhythm, rate 65 BPM.  Indirect Calorimeter completed today shows a VO2 of 424 and a REE of 1670.  Her calculated basal metabolic rate is 8828 thus her basal metabolic rate is better than expected.  General: Cooperative, alert, well developed, in no acute distress. HEENT: Conjunctivae and lids unremarkable. Cardiovascular: Regular rhythm.  Lungs: Normal work of breathing. Neurologic: No focal deficits.   Lab Results  Component Value Date   CREATININE 0.69 02/17/2021   BUN 14 02/17/2021   NA 137 02/17/2021   K 4.0 02/17/2021   CL 105 02/17/2021   CO2 25 02/17/2021   Lab Results  Component Value Date   ALT 24 01/26/2019   AST 16 01/26/2019   ALKPHOS 58 09/29/2016   BILITOT 0.6 01/26/2019   Lab Results  Component Value Date   HGBA1C 6.2 (H) 04/01/2022   HGBA1C 6.0 (H) 02/19/2021   Lab Results  Component Value Date   INSULIN 6.8 04/01/2022  Lab Results  Component Value Date   TSH 1.180 04/01/2022   Lab Results  Component Value Date   CHOL 264 (H) 04/01/2022   HDL 61 04/01/2022   LDLCALC 178 (H) 04/01/2022   TRIG 139 04/01/2022   CHOLHDL 4.3 04/01/2022   Lab Results  Component Value Date   WBC 6.0 02/17/2021   HGB 14.7 02/17/2021   HCT 43.5 02/17/2021   MCV 89.1 02/17/2021   PLT 241 02/17/2021   No results found for: "IRON", "TIBC", "FERRITIN"  Attestation Statements:   Reviewed by clinician on day of visit: allergies, medications, problem list, medical history, surgical history, family history, social history, and previous encounter notes.   Wilhemena Durie, am acting as transcriptionist for Loyal Gambler, DO.  I  have reviewed the above documentation for accuracy and completeness, and I agree with the above. Dell Ponto, DO

## 2022-04-18 ENCOUNTER — Other Ambulatory Visit (HOSPITAL_COMMUNITY): Payer: Self-pay

## 2022-04-18 DIAGNOSIS — Z79899 Other long term (current) drug therapy: Secondary | ICD-10-CM | POA: Diagnosis not present

## 2022-04-18 DIAGNOSIS — M1990 Unspecified osteoarthritis, unspecified site: Secondary | ICD-10-CM | POA: Diagnosis not present

## 2022-04-18 DIAGNOSIS — Z8249 Family history of ischemic heart disease and other diseases of the circulatory system: Secondary | ICD-10-CM | POA: Diagnosis not present

## 2022-04-18 MED ORDER — SULFASALAZINE 500 MG PO TBEC
500.0000 mg | DELAYED_RELEASE_TABLET | Freq: Two times a day (BID) | ORAL | 0 refills | Status: DC
  Filled 2022-04-18: qty 60, 30d supply, fill #0

## 2022-04-18 MED ORDER — CELECOXIB 50 MG PO CAPS
ORAL_CAPSULE | ORAL | 1 refills | Status: DC
  Filled 2022-04-18: qty 60, 15d supply, fill #0
  Filled 2022-05-16: qty 60, 15d supply, fill #1

## 2022-04-21 ENCOUNTER — Other Ambulatory Visit (HOSPITAL_COMMUNITY): Payer: Self-pay

## 2022-04-21 NOTE — Progress Notes (Signed)
Chief Complaint:   OBESITY Robin Maynard is here to discuss her progress with her obesity treatment plan along with follow-up of her obesity related diagnoses. Robin Maynard is on the Category 2 Plan and states she is following her eating plan approximately 75% of the time. 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  Today's visit was #: 2 Starting weight: 176 lbs Starting date: 04/01/2022 Today's weight: 173 lbs Today's date: 04/15/2022 Total lbs lost to date: 3 lbs Total lbs lost since last in-office visit: 3 lbs  Interim History: Did not like bread on meal plan.  Still doing intermittent fasting in the morning and drinks lots water. Fasting tends to feel better with auto immune issues.  Stays active on the farm.  Sugar cravings at night.  Denies hunger or over eating.   Subjective:   1. Other hyperlipidemia Discussed labs with patient today. Total cholesterol 264, LDL 178.  Positive family history of hyperlipidemia.   2. Vitamin D deficiency Discussed labs with patient today. Vitamin D level 44.  On a daily multivitamin with 2,000 IU of Vitamin D in it.  3. Pre-diabetes New diagnosis. Discussed labs with patient today. Has never been on metformin.  A1c 6.2, up from 6.0.  Has been working on diet and exercise.  Has a positive family history of T2DM.   4. Rheumatoid arthritis, involving unspecified site, unspecified whether rheumatoid factor present [facility]) Seeing Dr Brigitte Pulse at [facility].  Currently not on daily treatment.  Needed steroids over the summer.  Not limiting physical activity.   Assessment/Plan:   1. Other hyperlipidemia Has follow up with PCP, she is hesitant to starting statin's.   2. Vitamin D deficiency Add OTC vitamin D , 2,000 IU daily.   3. Pre-diabetes Deniya has declined treatment with metformin.  Consider [phone] once available.    4. Rheumatoid arthritis, involving unspecified site, unspecified whether rheumatoid factor  present ([place]) Follow up with rheumatology.   5. Obesity,current BMI 29.8 Change to journaling.  Ok to continue 16:8 intermittent fasting.    Robin Maynard is currently in the action stage of change. As such, her goal is to continue with weight loss efforts. She has agreed to keeping a food journal and adhering to recommended goals of 1200 calories and 70-80 protein.   Exercise goals:  As is.   Behavioral modification strategies: increasing vegetables, increasing water intake, decreasing liquid calories, keeping healthy foods in the home, ways to avoid boredom eating, better snacking choices, and planning for success.  Genisis has agreed to follow-up with our clinic in 3-4 weeks. She was informed of the importance of frequent follow-up visits to maximize her success with intensive lifestyle modifications for her multiple health conditions.   Objective:   Blood pressure (!) 146/83, pulse 67, temperature 97.8 F (36.6 C), height '5\' 4"'$  (1.626 m), weight 173 lb (78.5 kg), SpO2 97 %. Body mass index is 29.7 kg/m.  General: Cooperative, alert, well developed, in no acute distress. HEENT: Conjunctivae and lids unremarkable. Cardiovascular: Regular rhythm.  Lungs: Normal work of breathing. Neurologic: No focal deficits.   Lab Results  Component Value Date   CREATININE 0.69 02/17/2021   BUN 14 02/17/2021   NA 137 02/17/2021   K 4.0 02/17/2021   CL 105 02/17/2021   CO2 25 02/17/2021   Lab Results  Component Value Date   ALT 24 01/26/2019   AST 16 01/26/2019   ALKPHOS 58 09/29/2016   BILITOT  0.6 01/26/2019   Lab Results  Component Value Date   HGBA1C 6.2 (H) 04/01/2022   HGBA1C 6.0 (H) 02/19/2021   Lab Results  Component Value Date   INSULIN 6.8 04/01/2022   Lab Results  Component Value Date   TSH 1.180 04/01/2022   Lab Results  Component Value Date   CHOL 264 (H) 04/01/2022   HDL 61 04/01/2022   LDLCALC 178 (H) 04/01/2022   TRIG 139 04/01/2022   CHOLHDL 4.3 04/01/2022    Lab Results  Component Value Date   VD25OH 28.55 (L) 07/04/2021   VD25OH 33.54 06/29/2019   Lab Results  Component Value Date   WBC 6.0 02/17/2021   HGB 14.7 02/17/2021   HCT 43.5 02/17/2021   MCV 89.1 02/17/2021   PLT 241 02/17/2021   No results found for: "IRON", "TIBC", "FERRITIN"  Attestation Statements:   Reviewed by clinician on day of visit: allergies, medications, problem list, medical history, surgical history, family history, social history, and previous encounter notes.  I, Davy Pique, am acting as Location manager for Loyal Gambler, DO.  I have reviewed the above documentation for accuracy and completeness, and I agree with the above. Dell Ponto, DO

## 2022-04-22 ENCOUNTER — Other Ambulatory Visit (HOSPITAL_COMMUNITY): Payer: Self-pay

## 2022-04-23 ENCOUNTER — Other Ambulatory Visit (HOSPITAL_COMMUNITY): Payer: Self-pay

## 2022-05-06 DIAGNOSIS — M130 Polyarthritis, unspecified: Secondary | ICD-10-CM | POA: Diagnosis not present

## 2022-05-06 DIAGNOSIS — H04123 Dry eye syndrome of bilateral lacrimal glands: Secondary | ICD-10-CM | POA: Diagnosis not present

## 2022-05-06 DIAGNOSIS — R519 Headache, unspecified: Secondary | ICD-10-CM | POA: Diagnosis not present

## 2022-05-06 DIAGNOSIS — H1013 Acute atopic conjunctivitis, bilateral: Secondary | ICD-10-CM | POA: Diagnosis not present

## 2022-05-15 ENCOUNTER — Ambulatory Visit (INDEPENDENT_AMBULATORY_CARE_PROVIDER_SITE_OTHER): Payer: 59 | Admitting: Family Medicine

## 2022-05-15 ENCOUNTER — Encounter (INDEPENDENT_AMBULATORY_CARE_PROVIDER_SITE_OTHER): Payer: Self-pay | Admitting: Family Medicine

## 2022-05-15 VITALS — BP 109/71 | HR 63 | Temp 98.5°F | Ht 64.0 in | Wt 169.0 lb

## 2022-05-15 DIAGNOSIS — Z6829 Body mass index (BMI) 29.0-29.9, adult: Secondary | ICD-10-CM

## 2022-05-15 DIAGNOSIS — E669 Obesity, unspecified: Secondary | ICD-10-CM | POA: Diagnosis not present

## 2022-05-15 DIAGNOSIS — R7303 Prediabetes: Secondary | ICD-10-CM | POA: Diagnosis not present

## 2022-05-15 DIAGNOSIS — E7800 Pure hypercholesterolemia, unspecified: Secondary | ICD-10-CM | POA: Diagnosis not present

## 2022-05-16 ENCOUNTER — Other Ambulatory Visit (HOSPITAL_COMMUNITY): Payer: Self-pay

## 2022-05-19 ENCOUNTER — Other Ambulatory Visit (HOSPITAL_COMMUNITY): Payer: Self-pay

## 2022-05-19 MED ORDER — VALACYCLOVIR HCL 1 G PO TABS
1000.0000 mg | ORAL_TABLET | Freq: Two times a day (BID) | ORAL | 0 refills | Status: AC
  Filled 2022-05-19: qty 20, 10d supply, fill #0

## 2022-05-20 ENCOUNTER — Other Ambulatory Visit (HOSPITAL_COMMUNITY): Payer: Self-pay

## 2022-05-22 NOTE — Progress Notes (Signed)
Chief Complaint:   OBESITY Robin Maynard is here to discuss her progress with her obesity treatment plan along with follow-up of her obesity related diagnoses. [name] is on keeping a food journal and adhering to recommended goals of 1200 calories and 70-80 grams protein daily and states she is following her eating plan approximately 75% of the time. 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  Today's visit was #: 3 Starting weight: 176 lbs Starting date: 04/01/2022 Today's weight: 169 lbs Today's date: 05/15/22 Total lbs lost to date: 7 Total lbs lost since last in-office visit: -4  Interim History: Struggling to journal and meal plan but makes good choices in the cafeteria.  Sticking to 16: 8 intermittent fasting.  Always hungry at night at home.  Allows dark chocolate, [place] yogurt bar, or fruit.  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  Subjective:   1. Pre-diabetes A1c 6.2 on 04/01/2022 though she consumes very little added sugar.  Lack of sleep with alternating shifts at work may be contributing.  2. Hypercholesterolemia LDL 178 on 04/01/2022; not on lipid-lowering therapy. ASCVD risk score 1.9% in 10 years= low risk.  Assessment/Plan:   1. Pre-diabetes Recheck in 2 to 3 months, focus on protein and fiber with meals.  2. Hypercholesterolemia Consider use of statin.  She is welcome to discuss with her PCP  3. Obesity,current BMI 29.1 1.  Try to journal and allow one high fiber carb with dinner. 2.  Okay to continue 16: 8 intermittent fasting. 3.  Increase water intake.  [name] is currently in the action stage of change. As such, her goal is to continue with weight loss efforts. She has agreed to keeping a food journal and adhering to recommended goals of 1200 calories and 80 grams protein daily .   Exercise goals: as is  Behavioral modification strategies: increasing lean protein intake, increasing  vegetables, increasing water intake, decreasing eating out, meal planning and cooking strategies, keeping healthy foods in the home, better snacking choices, and decreasing junk food.  [name] has agreed to follow-up with our clinic in 3-4 weeks. She was informed of the importance of frequent follow-up visits to maximize her success with intensive lifestyle modifications for her multiple health conditions.   Objective:   Blood pressure 109/71, pulse 63, temperature 98.5 F (36.9 C), height '5\' 4"'$  (1.626 m), weight 169 lb (76.7 kg), SpO2 94 %. Body mass index is 29.01 kg/m.  General: Cooperative, alert, well developed, in no acute distress. HEENT: Conjunctivae and lids unremarkable. Cardiovascular: Regular rhythm.  Lungs: Normal work of breathing. Neurologic: No focal deficits.   Lab Results  Component Value Date   CREATININE 0.69 02/17/2021   BUN 14 02/17/2021   NA 137 02/17/2021   K 4.0 02/17/2021   CL 105 02/17/2021   CO2 25 02/17/2021   Lab Results  Component Value Date   ALT 24 01/26/2019   AST 16 01/26/2019   ALKPHOS 58 09/29/2016   BILITOT 0.6 01/26/2019   Lab Results  Component Value Date   HGBA1C 6.2 (H) 04/01/2022   HGBA1C 6.0 (H) 02/19/2021   Lab Results  Component Value Date   INSULIN 6.8 04/01/2022   Lab Results  Component Value Date   TSH 1.180 04/01/2022   Lab Results  Component Value Date   CHOL 264 (H) 04/01/2022   HDL 61 04/01/2022   LDLCALC 178 (H) 04/01/2022   TRIG 139 04/01/2022  CHOLHDL 4.3 04/01/2022   Lab Results  Component Value Date   VD25OH 28.55 (L) 07/04/2021   VD25OH 33.54 06/29/2019   Lab Results  Component Value Date   WBC 6.0 02/17/2021   HGB 14.7 02/17/2021   HCT 43.5 02/17/2021   MCV 89.1 02/17/2021   PLT 241 02/17/2021   No results found for: "IRON", "TIBC", "FERRITIN"   Attestation Statements:   Reviewed by clinician on day of visit: allergies, medications, problem list, medical history, surgical history,  family history, social history, and previous encounter notes.  I, Georgianne Fick, FNP, am acting as transcriptionist for Dr. Loyal Gambler.  I have reviewed the above documentation for accuracy and completeness, and I agree with the above. Dell Ponto, DO

## 2022-06-06 ENCOUNTER — Other Ambulatory Visit (HOSPITAL_COMMUNITY): Payer: Self-pay

## 2022-06-06 MED ORDER — VALACYCLOVIR HCL 500 MG PO TABS
500.0000 mg | ORAL_TABLET | Freq: Two times a day (BID) | ORAL | 3 refills | Status: DC
  Filled 2022-06-06: qty 180, 90d supply, fill #0

## 2022-06-18 ENCOUNTER — Other Ambulatory Visit (HOSPITAL_COMMUNITY): Payer: Self-pay

## 2022-06-18 DIAGNOSIS — Z79899 Other long term (current) drug therapy: Secondary | ICD-10-CM | POA: Diagnosis not present

## 2022-06-18 DIAGNOSIS — M1990 Unspecified osteoarthritis, unspecified site: Secondary | ICD-10-CM | POA: Diagnosis not present

## 2022-06-18 DIAGNOSIS — R760 Raised antibody titer: Secondary | ICD-10-CM | POA: Diagnosis not present

## 2022-06-18 DIAGNOSIS — Z8249 Family history of ischemic heart disease and other diseases of the circulatory system: Secondary | ICD-10-CM | POA: Diagnosis not present

## 2022-06-18 DIAGNOSIS — R519 Headache, unspecified: Secondary | ICD-10-CM | POA: Diagnosis not present

## 2022-06-18 MED ORDER — PREDNISONE 5 MG PO TABS
ORAL_TABLET | ORAL | 0 refills | Status: DC
  Filled 2022-06-18: qty 23, 12d supply, fill #0

## 2022-06-18 MED ORDER — METHOTREXATE SODIUM 2.5 MG PO TABS
10.0000 mg | ORAL_TABLET | ORAL | 0 refills | Status: DC
  Filled 2022-06-18: qty 20, 35d supply, fill #0

## 2022-06-18 MED ORDER — FOLIC ACID 1 MG PO TABS
1.0000 mg | ORAL_TABLET | Freq: Every day | ORAL | 1 refills | Status: DC
Start: 2022-06-18 — End: 2023-06-28
  Filled 2022-06-18: qty 30, 30d supply, fill #0

## 2022-06-30 ENCOUNTER — Other Ambulatory Visit (HOSPITAL_COMMUNITY): Payer: Self-pay

## 2022-06-30 ENCOUNTER — Ambulatory Visit (INDEPENDENT_AMBULATORY_CARE_PROVIDER_SITE_OTHER): Payer: 59 | Admitting: Family Medicine

## 2022-06-30 ENCOUNTER — Encounter (INDEPENDENT_AMBULATORY_CARE_PROVIDER_SITE_OTHER): Payer: Self-pay | Admitting: Family Medicine

## 2022-06-30 VITALS — BP 127/83 | HR 71 | Temp 98.3°F | Ht 64.0 in | Wt 170.0 lb

## 2022-06-30 DIAGNOSIS — R7303 Prediabetes: Secondary | ICD-10-CM | POA: Diagnosis not present

## 2022-06-30 DIAGNOSIS — Z6829 Body mass index (BMI) 29.0-29.9, adult: Secondary | ICD-10-CM

## 2022-06-30 DIAGNOSIS — E7849 Other hyperlipidemia: Secondary | ICD-10-CM | POA: Diagnosis not present

## 2022-06-30 DIAGNOSIS — E669 Obesity, unspecified: Secondary | ICD-10-CM | POA: Diagnosis not present

## 2022-06-30 DIAGNOSIS — Z6830 Body mass index (BMI) 30.0-30.9, adult: Secondary | ICD-10-CM

## 2022-06-30 MED ORDER — RYBELSUS 3 MG PO TABS
1.0000 | ORAL_TABLET | Freq: Every day | ORAL | 0 refills | Status: DC
  Filled 2022-06-30: qty 30, 30d supply, fill #0

## 2022-07-01 ENCOUNTER — Encounter (INDEPENDENT_AMBULATORY_CARE_PROVIDER_SITE_OTHER): Payer: Self-pay

## 2022-07-01 ENCOUNTER — Other Ambulatory Visit (HOSPITAL_COMMUNITY): Payer: Self-pay

## 2022-07-02 DIAGNOSIS — M1990 Unspecified osteoarthritis, unspecified site: Secondary | ICD-10-CM | POA: Diagnosis not present

## 2022-07-02 DIAGNOSIS — R2689 Other abnormalities of gait and mobility: Secondary | ICD-10-CM | POA: Diagnosis not present

## 2022-07-02 DIAGNOSIS — Z0000 Encounter for general adult medical examination without abnormal findings: Secondary | ICD-10-CM | POA: Diagnosis not present

## 2022-07-07 ENCOUNTER — Encounter: Payer: Self-pay | Admitting: Internal Medicine

## 2022-07-07 ENCOUNTER — Ambulatory Visit: Payer: 59 | Admitting: Internal Medicine

## 2022-07-07 VITALS — BP 110/72 | HR 94 | Ht 64.0 in | Wt 173.6 lb

## 2022-07-07 DIAGNOSIS — R7989 Other specified abnormal findings of blood chemistry: Secondary | ICD-10-CM

## 2022-07-07 DIAGNOSIS — E559 Vitamin D deficiency, unspecified: Secondary | ICD-10-CM

## 2022-07-07 DIAGNOSIS — E063 Autoimmune thyroiditis: Secondary | ICD-10-CM | POA: Insufficient documentation

## 2022-07-07 DIAGNOSIS — E348 Other specified endocrine disorders: Secondary | ICD-10-CM | POA: Diagnosis not present

## 2022-07-07 NOTE — Patient Instructions (Addendum)
Please return for labs in am, fasting, after 2-3 weeks.  Hold multivitamins for 2 days.  You should have an endocrinology follow-up appointment in 1 year.

## 2022-07-07 NOTE — Progress Notes (Addendum)
Patient ID: Robin Maynard, female   DOB: 01/05/1966, 56 y.o.   MRN: [id number]   HPI  Robin Maynard is a 56 y.o.-year-old female, initially referred by her PCP, Dr.Le, returning for follow-up for for low cortisol level -ruled out for adrenal insufficiency, also, euthyroid Hashimoto's thyroiditis.  Last visit 1 year ago.  Interim history: She is feeling well, without complaints today.  She lost 6 more pounds since last visit. She still has dizziness episodes  - sees neurology. Her rheumatologist recommended acromegaly check 2/2 enlarged hands and feet.  She mentions that over the years, she increased her shoe size from 8-9.  Also, she feels that her hands are larger.  Her wedding ring is tight there.  She denies increased headaches, increased dental spacing, increased sweating.  She does have joint aches-RA.  History of low cortisol level: She had URI 09/2016 >> L upper abd. Pain >> ED >> CT scan - hazing around sSMA >> referred to Dr. Ardis Hughs >> further investig. Was negative >> sent back to PCP, who checked a cortisol level >> low cortisol level in 03/2017.  No h/o steroid use in the past (Dexamethasone 10 mg in 12/2016). No h/o Depo-provera, Megace, po ketoconazole, phenytoin, rifampin, chronic fluconazole use. No h/o autoimmune diseases in pt or family mbs. No excess use of NSAIDs. No h/o generalized infections or HIV. No IVDA. No h/o head injury or severe HA. No h/o malignancy.  Reviewed previous investigation:         Office Visit on 08/10/2017  Component Date Value Ref Range Status   C206 ACTH 08/10/2017 20  6 - 50 pg/mL Final    Comment: Reference range applies only to specimens collected between 7am-10am     Cortisol, Plasma 08/10/2017 7.7  ug/dL Final    AM:  4.3 - 22.4 ug/dLPM:  3.1 - 16.7 ug/dL   Cortisol, Plasma 08/10/2017 19.2  ug/dL Final    AM:  4.3 - 22.4 ug/dLPM:  3.1 - 16.7 ug/dL   Cortisol, Plasma 08/10/2017 22.7  ug/dL Final    AM:  4.3 - 22.4 ug/dLPM:   3.1 - 16.7 ug/dL   03/19/2017: cortisol 4.9 (am)  No history of hyponatremia or hyperkalemia:  Ref Range & Units 08/18/2021  Sodium 135 - 146 MMOL/L 140  Potassium 3.5 - 5.3 MMOL/L 4.1  Chloride 98 - 110 MMOL/L 106  CO2 21 - 31 MMOL/L 26  BUN 8 - 24 MG/DL 22  Glucose 70 - 99 MG/DL 105 High   Creatinine 0.60 - 1.20 MG/DL 0.83  Calcium 8.5 - 10.5 MG/DL 9.7  Total Protein 6.4 - 8.9 G/DL 6.9  Albumin 3.5 - 5.7 G/DL 4.6  Total Bilirubin 0.0 - 1.0 MG/DL 0.7  Alkaline Phosphatase 34 - 104 IU/L or U/L 69  AST (SGOT) 13 - 39 IU/L or U/L 19  ALT (SGPT) 7 - 52 IU/L or U/L 28  Anion Gap 4 - 14 MMOL/L 8  Est. GFR >=60 ML/MIN/1.73 M*2 82     Chemistry      Component Value Date/Time   NA 137 02/17/2021 1335   K 4.0 02/17/2021 1335   CL 105 02/17/2021 1335   CO2 25 02/17/2021 1335   BUN 14 02/17/2021 1335   CREATININE 0.69 02/17/2021 1335   CREATININE 0.81 01/26/2019 0909      Component Value Date/Time   CALCIUM 9.1 02/17/2021 1335   ALKPHOS 58 09/29/2016 0524   AST 16 01/26/2019 0909   ALT 24 01/26/2019 0909  BILITOT 0.6 01/26/2019 0909     Pt. also has a history of DUB, s/p ablation 12/2016, GERD, elevated FSH of 53 in 03/2017.  Reviewed history: Patient describes that in ~2017 she started to be very tired and this exacerbated over time.  However, since 04/2018 she feels that her fatigue improved slightly.  Her PCP investigated her for possible autoimmune diseases and also chronic viral infections and the investigation was negative except for elevated thyroglobulin antibodies.  She was referred back to endocrinology.  Hashimoto's thyroiditis:  Reviewed previous investigation:  Component     Latest Ref Rng & Units 07/04/2021  Thyroglobulin Ab     < or = 1 IU/mL 4 (H)  Thyroperoxidase Ab SerPl-aCnc     <9 IU/mL 2   Component     Latest Ref Rng & Units 10/26/2018 06/29/2019 07/03/2020  Thyroglobulin Ab     < or = 1 IU/mL 6 (H) 5 (H) 8 (H)  Thyroperoxidase Ab SerPl-aCnc      <9 IU/mL   2  06/08/2018: Thyroglobulin Ab 6.7, TPO Ab 10  Component     Latest Ref Rng & Units 06/29/2019  Thyroglobulin Ab     < or = 1 IU/mL 5 (H)  VITD     30.00 - 100.00 ng/mL 33.54  Vitamin B12     211 - 911 pg/mL 812   Previous TSH levels: Lab Results  Component Value Date   TSH 1.180 04/01/2022   TSH 1.76 07/04/2021   TSH 3.71 02/19/2021   TSH 1.58 07/03/2020   TSH 1.69 06/29/2019   TSH 1.41 10/26/2018   TSH 1.48 06/08/2018   I recommended to start selenium to help lower her antithyroid antibodies.  She started a multivitamin + Selenium 150 mcg daily and she felt much better afterwards. This contains Biotin 1200 mcg.  She previously saw rheumatology.  She was found to have hypogammaglobulinemia. She also saw an immunologist >> she was reassured about this condition.  However, she was then seen by rheumatology for a second opinion at [facility] >> possible RA. Started HO Chloroquine >> nausea, GERD, metallic taste in mouth >> stopped.  As of now, her joint pains resolved.  She is on Turmeric, ginger root, omega 3 FAs.   She continues on vitamin D 2000 IU daily.  Reviewed previous vitamin D levels: 04/01/2022: Vitamin D 33 Lab Results  Component Value Date   VD25OH 28.55 (L) 07/04/2021   VD25OH 33.54 06/29/2019   ROS: + see HPI  I reviewed pt's medications, allergies, PMH, social hx, family hx, and changes were documented in the history of present illness. Otherwise, unchanged from my initial visit note.  Past Medical History:  Diagnosis Date   Acute pancreatitis    Autoimmune thyroiditis    Constipation    Diverticulitis    Gastritis    GERD (gastroesophageal reflux disease)    GI work up in progress   Hashimoto's thyroiditis    Headache    Heartburn    Herpes    Joint pain    Mass on back    Obesity    Pancreatic disease    PONV (postoperative nausea and vomiting)    Rheumatoid arthritis (HCC)    SOBOE (shortness of breath on exertion)    Thyroid  condition    Vitamin D deficiency    Past Surgical History:  Procedure Laterality Date   COLONOSCOPY     DILITATION & CURRETTAGE/HYSTROSCOPY WITH HYDROTHERMAL ABLATION N/A 12/26/2016   Procedure: DILATATION &  CURETTAGE/HYSTEROSCOPY WITH HYDROTHERMAL ABLATION;  Surgeon: Dian Queen, MD;  Location: [facility];  Service: Gynecology;  Laterality: N/A;   MASS EXCISION Right 02/23/2018   Procedure: EXCISION BACK MASS ERAS PATHWAY;  Surgeon: Rolm Bookbinder, MD;  Location: [place];  Service: General;  Laterality: Right;   POLYPECTOMY     TONSILLECTOMY AND ADENOIDECTOMY     TYMPANOSTOMY TUBE PLACEMENT     Social History   Socioeconomic History   Marital status: Married    Spouse name: Not on file   Number of children: 1  Social Needs  Occupational History   [employer] - [employer]  Tobacco Use   Smoking status: Never Smoker   Smokeless tobacco: Never Used  Substance and Sexual Activity   Alcohol use: No   Drug use: No   Current Outpatient Medications on File Prior to Visit  Medication Sig Dispense Refill   celecoxib (CELEBREX) 50 MG capsule Take 2 capsules (100 mg total) by mouth 2 (two)  times daily as needed for Pain. 60 capsule 1   celecoxib (CELEBREX) 50 MG capsule Take 2 capsules (100 mg total) by mouth 2 (two)  times daily as needed for Pain. 60 capsule 1   esomeprazole (NEXIUM) 40 MG capsule TAKE 1 CAPSULE (40 MG TOTAL) BY MOUTH DAILY. (Patient taking differently: Take by mouth as needed.) 30 capsule 1   folic acid (FOLVITE) 1 MG tablet Take 1 tablet (1 mg total) by mouth daily. 30 tablet 1   Magnesium Gluconate 550 MG TABS Take 550 mg by mouth. 2 to 3 times a week     methotrexate (RHEUMATREX) 2.5 MG tablet Take 4 tablets (10 mg total) by mouth once a week. 20 tablet 0   Multiple Vitamin (MULTI-VITAMIN) tablet Take 1 tablet by mouth daily.     Omega-3 1000 MG CAPS Take by mouth.     predniSONE (DELTASONE) 5 MG tablet Take 3 tablets (15 mg  total) by mouth daily for 3 days, THEN 2 tablets (10 mg total) daily for 3 days, THEN 1.5 tablets (7.5 mg total) daily for 3 days, THEN 1 tablet (5 mg total) daily for 3 days. 23 tablet 0   Semaglutide (RYBELSUS) 3 MG TABS Take 1 tablet by mouth daily. 30 tablet 0   sulfaSALAzine (AZULFIDINE) 500 MG EC tablet Take 1 tablet (500 mg total) by mouth 2 (two) times daily with food. Start 1 pill daily with food for 2 weeks then increase to twice daily. 60 tablet 0   SUMAtriptan (IMITREX) 50 MG tablet Take 1 tablet (50 mg total) by mouth once as needed for up to 1 dose for Migraine. 2 tablet 0   Turmeric 400 MG CAPS See admin instructions.     valACYclovir (VALTREX) 500 MG tablet Take 1 tablet (500 mg total) by mouth 2 (two) times daily. 180 tablet 3   zolpidem (AMBIEN) 10 MG tablet TAKE 1 TABLET BY MOUTH EVERY NIGHT AT BEDTIME 30 tablet 3   No current facility-administered medications on file prior to visit.   Allergies  Allergen Reactions   Doxycycline Nausea And Vomiting   Flulaval Quadrivalent [Influenza Vac Split Quad] Other (See Comments)    Weakness and don't feel good from the vaccine   Hydroxychloroquine Other (See Comments)    Nausea/malaise/reflux    Family History  Problem Relation Age of Onset   Deep vein thrombosis Mother    Colon polyps Mother    Heart disease Mother    Depression Mother  Anxiety disorder Mother    Schizophrenia Mother    Obesity Mother    Cancer Father    Hyperlipidemia Father    Gastric cancer Father    Diabetes Father    Heart disease Father    Hypertension Father    Stomach cancer Father    Diabetes Paternal Grandmother    Heart disease Paternal Grandmother    Aortic stenosis Son    Breast cancer Maternal Aunt    Colon cancer Neg Hx    Allergic rhinitis Neg Hx    Asthma Neg Hx    Angioedema Neg Hx    Eczema Neg Hx    Urticaria Neg Hx    Rectal cancer Neg Hx    Esophageal cancer Neg Hx     PE: BP 110/72 (BP Location: Left Arm, Patient  Position: Sitting, Cuff Size: Normal)   Pulse 94   Ht '5\' 4"'$  (1.626 m)   Wt 173 lb 9.6 oz (78.7 kg)   SpO2 95%   BMI 29.80 kg/m  Wt Readings from Last 3 Encounters:  07/07/22 173 lb 9.6 oz (78.7 kg)  06/30/22 170 lb (77.1 kg)  05/15/22 169 lb (76.7 kg)   Constitutional: overweight, in NAD Eyes:  EOMI, no exophthalmos ENT: no neck masses, no cervical lymphadenopathy Cardiovascular: Tachycardia, RR, No MRG Respiratory: CTA B Musculoskeletal: no deformities Skin:no rashes Neurological: no tremor with outstretched hands  ASSESSMENT: 1. H/o Low cortisol level  2.  Euthyroid Hashimoto's thyroiditis  3.  Vitamin D deficiency  4.  Growth disorder  PLAN:  H/o Low cortisol level -Patient with history of fatigue, abdominal pain, and a slightly low a.m. cortisol in 03/2017 -She remembers feeling great after 10 mg of dexamethasone which was given at the time of her endometrial ablation in 12/2016.  We discussed that is not unusual, even in patients without adrenal insufficiency -Her adrenals appeared normal on review of the CT scan reports from 12/2016 and 05/2021 -A cosyntropin stimulation test was normal in 2019. -At last visit, she returned feeling much better, with an 11 pound intentional weight loss after doing intermittent fasting.  She lost 9 more pounds since then.  She has no anorexia, nausea, abdominal pain, or other signs of adrenal insufficiency. -No further investigation is needed for this  2. Euthyroid Hashimoto's thyroiditis -Diagnosed based on elevated antithyroid antibodies -At last visit, TPO antibodies were normal, but thyroglobulin antibodies were still high; TFTs were normal at that time. -She had a repeat set of TFTs in 03/2022. -We did discuss in the past about treatment of Hashimoto's thyroiditis which is actually limited to thyroid hormones in case thyroid function tests are normal.  Supplements like selenium have been tried with various results, some showing  improvement in the antithyroid antibodies.  She continues on selenium 150 mcg daily along with a multivitamin.  She started to feel much better after starting selenium. -At today's visit she has no hypothyroid symptoms.  Before last visit, she started to feel much better after doing intermittent fasting and losing weight.  Since last visit she lost more weight, a total of 17 pounds in the last 3 years. -We will recheck her TFTs in 2 to 3 weeks (now on a steroid taper) - I will see her back in 1 year  3.  Vitamin D deficiency Joint pain is improved after she changed her diet in the past -She continues on 2000 units vitamin D daily-started a week prior to our last visit -At that time, HbA1c was  slightly low, at 28.55 -However, she had a repeat vitamin D on 04/01/2022 which was normal, is 44 -We will continue to keep an eye on this  5.  Growth disorder -Enlarged hands and feet -No increased sweating, headaches, prognatism, wide interdental spaces -We will check a growth hormone and IGF-I to investigate for acromegaly-she will return for these fasting  Orders Placed This Encounter  Procedures   Growth hormone   Insulin-like growth factor   TSH   T4, free   T3, free   Component     Latest Ref Rng 07/24/2022  TSH     0.35 - 5.50 uIU/mL 1.66   Triiodothyronine,Free,Serum     2.3 - 4.2 pg/mL 3.5   T4,Free(Direct)     0.60 - 1.60 ng/dL 1.00   IGF-I, LC/MS     50 - 317 ng/mL 97   Z-Score (Female)     -2.0 - 2.0 SD -0.7   Growth Hormone     < OR = 7.1 ng/mL 0.1   All labs are normal.  No signs of acromegaly.  Philemon Kingdom, MD PhD [facility]

## 2022-07-10 NOTE — Progress Notes (Signed)
All     Chief Complaint:   OBESITY Robin Maynard is here to discuss her progress with her obesity treatment plan along with follow-up of her obesity related diagnoses. Robin Maynard is on keeping a food journal and adhering to recommended goals of 1200 calories and 80 protein and states she is following her eating plan approximately 75% of the time. 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  Today's visit was #: 4 Starting weight: 176 LBS Starting date: 04/01/2022 Today's weight: 170 LBS Today's date: 06/30/2022 Total lbs lost to date: 6 LBS Total lbs lost since last in-office visit: +1 LB  Interim History: Patient is still working days and nights as a Immunologist.  She is not logging daily intake.  She is active on horse farm, but not tracking her steps.  She is eating 2 meals and snacks.  She has worsening hunger and cravings.  She likes to sleep due to work.  She recently traveled to [facility].  Subjective:   1. Prediabetes Last A1c was 6.2, 04/01/2022.  She craves sweets.  She never used metformin.   2. Other hyperlipidemia LDL, 178.  She has never been on lipid lowering medications.  The 10-year ASCVD risk score (Arnett DK, et al., 2019) is: 2.2%   Values used to calculate the score:     Age: 56 years     Sex: Female     Is Non-Hispanic African American: No     Diabetic: No     Tobacco smoker: No     Systolic Blood Pressure: 970 mmHg     Is BP treated: No     HDL Cholesterol: 61 mg/dL     Total Cholesterol: 264 mg/dL  Assessment/Plan:   1. Prediabetes Begin Rybelsus, take before the 1st meal of the day.   Begin - Semaglutide (RYBELSUS) 3 MG TABS; Take 1 tablet by mouth daily.  Dispense: 30 tablet; Refill: 0  2. Other hyperlipidemia ASCVD score is low, continue to monitor.   3. Obesity,current BMI 29.2 Increase kcals to 1500 per day with 75 g of protein daily  Robin Maynard is currently in the action stage of change. As such, her goal is to continue  with weight loss efforts. She has agreed to keeping a food journal and adhering to recommended goals of 1500 calories and 75 protein.   Exercise goals: Start tracking daily steps.  Behavioral modification strategies: increasing lean protein intake, increasing vegetables, increasing water intake, decreasing eating out, no skipping meals, meal planning and cooking strategies, and keeping healthy foods in the home.  Robin Maynard has agreed to follow-up with our clinic in 3-4 weeks. She was informed of the importance of frequent follow-up visits to maximize her success with intensive lifestyle modifications for her multiple health conditions.   Objective:   Blood pressure 127/83, pulse 71, temperature 98.3 F (36.8 C), height '5\' 4"'$  (1.626 m), weight 170 lb (77.1 kg), SpO2 96 %. Body mass index is 29.18 kg/m.  General: Cooperative, alert, well developed, in no acute distress. HEENT: Conjunctivae and lids unremarkable. Cardiovascular: Regular rhythm.  Lungs: Normal work of breathing. Neurologic: No focal deficits.   Lab Results  Component Value Date   CREATININE 0.69 02/17/2021   BUN 14 02/17/2021   NA 137 02/17/2021   K 4.0 02/17/2021   CL 105 02/17/2021   CO2 25 02/17/2021   Lab Results  Component Value Date   ALT 24 01/26/2019   AST 16 01/26/2019  ALKPHOS 58 09/29/2016   BILITOT 0.6 01/26/2019   Lab Results  Component Value Date   HGBA1C 6.2 (H) 04/01/2022   HGBA1C 6.0 (H) 02/19/2021   Lab Results  Component Value Date   INSULIN 6.8 04/01/2022   Lab Results  Component Value Date   TSH 1.180 04/01/2022   Lab Results  Component Value Date   CHOL 264 (H) 04/01/2022   HDL 61 04/01/2022   LDLCALC 178 (H) 04/01/2022   TRIG 139 04/01/2022   CHOLHDL 4.3 04/01/2022   Lab Results  Component Value Date   VD25OH 28.55 (L) 07/04/2021   VD25OH 33.54 06/29/2019   Lab Results  Component Value Date   WBC 6.0 02/17/2021   HGB 14.7 02/17/2021   HCT 43.5 02/17/2021   MCV 89.1  02/17/2021   PLT 241 02/17/2021   No results found for: "IRON", "TIBC", "FERRITIN"  Attestation Statements:   Reviewed by clinician on day of visit: allergies, medications, problem list, medical history, surgical history, family history, social history, and previous encounter notes.  I, Davy Pique, am acting as Location manager for Loyal Gambler, DO.  I have reviewed the above documentation for accuracy and completeness, and I agree with the above. Dell Ponto, DO

## 2022-07-11 ENCOUNTER — Other Ambulatory Visit (HOSPITAL_COMMUNITY): Payer: Self-pay

## 2022-07-11 DIAGNOSIS — H5213 Myopia, bilateral: Secondary | ICD-10-CM | POA: Diagnosis not present

## 2022-07-11 DIAGNOSIS — H04123 Dry eye syndrome of bilateral lacrimal glands: Secondary | ICD-10-CM | POA: Diagnosis not present

## 2022-07-11 DIAGNOSIS — H524 Presbyopia: Secondary | ICD-10-CM | POA: Diagnosis not present

## 2022-07-11 DIAGNOSIS — H52223 Regular astigmatism, bilateral: Secondary | ICD-10-CM | POA: Diagnosis not present

## 2022-07-11 DIAGNOSIS — H40053 Ocular hypertension, bilateral: Secondary | ICD-10-CM | POA: Diagnosis not present

## 2022-07-11 MED ORDER — VALACYCLOVIR HCL 1 G PO TABS
1000.0000 mg | ORAL_TABLET | Freq: Three times a day (TID) | ORAL | 0 refills | Status: DC
  Filled 2022-07-11: qty 30, 10d supply, fill #0

## 2022-07-17 NOTE — Progress Notes (Signed)
[facility] Note  07/21/2022     CHIEF COMPLAINT Patient presents for Retina Evaluation   HISTORY OF PRESENT ILLNESS: Robin Maynard is a 56 y.o. female who presents to the clinic today for:   HPI     Retina Evaluation   In both eyes.  This started 1 week ago.  Duration of 1 week.  Context:  distance vision, mid-range vision and near vision.  I, the attending physician,  performed the HPI with the patient and updated documentation appropriately.        Comments   Retina evaluation per Dr. Dorena Cookey possible holes ou pt is reporting blurred vision right eye she denies flashes or floaters       Last edited by Bernarda Caffey, MD on 07/21/2022  5:12 PM.    Patient states that she is having some blurry vision. Patient has RA and is not on medication for it (MTX+folic acid, and celebrex). Patient is complaining of ear pain and she is getting herpetic outbreak around the nose and in the nose. She is taking Valtrex 1,000 mg TID.  Referring physician: Shawnie Dapper, DO [address] [place],  [place] [address]  HISTORICAL INFORMATION:   Selected notes from the MEDICAL RECORD NUMBER Referred by Dr. Jerline Pain LEE:  Ocular Hx- PMH-    CURRENT MEDICATIONS: No current outpatient medications on file. (Ophthalmic Drugs)   No current facility-administered medications for this visit. (Ophthalmic Drugs)   Current Outpatient Medications (Other)  Medication Sig   celecoxib (CELEBREX) 50 MG capsule Take 2 capsules (100 mg total) by mouth 2 (two)  times daily as needed for Pain.   celecoxib (CELEBREX) 50 MG capsule Take 2 capsules (100 mg total) by mouth 2 (two)  times daily as needed for Pain.   esomeprazole (NEXIUM) 40 MG capsule TAKE 1 CAPSULE (40 MG TOTAL) BY MOUTH DAILY. (Patient taking differently: Take by mouth as needed.)   folic acid (FOLVITE) 1 MG tablet Take 1 tablet (1 mg total) by mouth daily.   Magnesium Gluconate 550 MG TABS Take 550 mg  by mouth. 2 to 3 times a week   methotrexate (RHEUMATREX) 2.5 MG tablet Take 4 tablets (10 mg total) by mouth once a week.   Multiple Vitamin (MULTI-VITAMIN) tablet Take 1 tablet by mouth daily.   Omega-3 1000 MG CAPS Take by mouth.   predniSONE (DELTASONE) 5 MG tablet Take 3 tablets (15 mg total) by mouth daily for 3 days, THEN 2 tablets (10 mg total) daily for 3 days, THEN 1.5 tablets (7.5 mg total) daily for 3 days, THEN 1 tablet (5 mg total) daily for 3 days.   Semaglutide (RYBELSUS) 3 MG TABS Take 1 tablet by mouth daily.   sulfaSALAzine (AZULFIDINE) 500 MG EC tablet Take 1 tablet (500 mg total) by mouth 2 (two) times daily with food. Start 1 pill daily with food for 2 weeks then increase to twice daily.   SUMAtriptan (IMITREX) 50 MG tablet Take 1 tablet (50 mg total) by mouth once as needed for up to 1 dose for Migraine.   Turmeric 400 MG CAPS See admin instructions.   valACYclovir (VALTREX) 1000 MG tablet Take 1 tablet by mouth three times a day   valACYclovir (VALTREX) 500 MG tablet Take 1 tablet (500 mg total) by mouth 2 (two) times daily.   zolpidem (AMBIEN) 10 MG tablet TAKE 1 TABLET BY MOUTH EVERY NIGHT AT BEDTIME   No current facility-administered medications for  this visit. (Other)   REVIEW OF SYSTEMS: ROS   Positive for: Musculoskeletal, Endocrine, Eyes Last edited by Bernarda Caffey, MD on 07/21/2022  5:19 PM.       ALLERGIES Allergies  Allergen Reactions   Doxycycline Nausea And Vomiting   Flulaval Quadrivalent [Influenza Vac Split Quad] Other (See Comments)    Weakness and don't feel good from the vaccine   Hydroxychloroquine Other (See Comments)    Nausea/malaise/reflux     PAST MEDICAL HISTORY Past Medical History:  Diagnosis Date   Acute pancreatitis    Autoimmune thyroiditis    Constipation    Diverticulitis    Gastritis    GERD (gastroesophageal reflux disease)    GI work up in progress   Hashimoto's thyroiditis    Headache    Heartburn    Herpes     Joint pain    Mass on back    Obesity    Pancreatic disease    PONV (postoperative nausea and vomiting)    Rheumatoid arthritis ([place])    SOBOE (shortness of breath on exertion)    Thyroid condition    Vitamin D deficiency    Past Surgical History:  Procedure Laterality Date   COLONOSCOPY     DILITATION & CURRETTAGE/HYSTROSCOPY WITH HYDROTHERMAL ABLATION N/A 12/26/2016   Procedure: DILATATION & CURETTAGE/HYSTEROSCOPY WITH HYDROTHERMAL ABLATION;  Surgeon: Dian Queen, MD;  Location: [facility];  Service: Gynecology;  Laterality: N/A;   MASS EXCISION Right 02/23/2018   Procedure: EXCISION BACK MASS ERAS PATHWAY;  Surgeon: Rolm Bookbinder, MD;  Location: [facility];  Service: General;  Laterality: Right;   POLYPECTOMY     TONSILLECTOMY AND ADENOIDECTOMY     TYMPANOSTOMY TUBE PLACEMENT      FAMILY HISTORY Family History  Problem Relation Age of Onset   Deep vein thrombosis Mother    Colon polyps Mother    Heart disease Mother    Depression Mother    Anxiety disorder Mother    Schizophrenia Mother    Obesity Mother    Cancer Father    Hyperlipidemia Father    Gastric cancer Father    Diabetes Father    Heart disease Father    Hypertension Father    Stomach cancer Father    Diabetes Paternal Grandmother    Heart disease Paternal Grandmother    Aortic stenosis Son    Breast cancer Maternal Aunt    Colon cancer Neg Hx    Allergic rhinitis Neg Hx    Asthma Neg Hx    Angioedema Neg Hx    Eczema Neg Hx    Urticaria Neg Hx    Rectal cancer Neg Hx    Esophageal cancer Neg Hx    SOCIAL HISTORY Social History   Tobacco Use   Smoking status: Never    Passive exposure: Current   Smokeless tobacco: Never  Vaping Use   Vaping Use: Never used  Substance Use Topics   Alcohol use: No   Drug use: No       OPHTHALMIC EXAM:  Base Eye Exam     Visual Acuity (Snellen - Linear)       Right Left   Dist cc 20/25 20/20    Correction:  Glasses         Tonometry (Tonopen, 9:39 AM)       Right Left   Pressure 16 19         Pupils       Pupils Dark Light Shape  React APD   Right PERRL 3 2 Round Brisk None   Left PERRL 3 2 Round Brisk None         Visual Fields       Left Right    Full Full         Extraocular Movement       Right Left    Full, Ortho Full, Ortho         Neuro/Psych     Oriented x3: Yes   Mood/Affect: Normal         Dilation     Both eyes: 2.5% Phenylephrine @ 9:39 AM           Slit Lamp and Fundus Exam     External Exam       Right Left   External Normal Normal         Slit Lamp Exam       Right Left   Lids/Lashes Meibomian gland dysfunction Meibomian gland dysfunction   Conjunctiva/Sclera White and quiet White and quiet   Cornea Debris in tear film Debris in tear film   Anterior Chamber Deep, 3+ fine pigment Deep and .5+ fine cell and pigment   Iris Round and dilated Round and dialted   Lens 1-2+ cortical 1-2+ cortical   Anterior Vitreous Vitreous syneresis, no cell, Vitreous condensations Vitreous syneresis, Vitreous condensations         Fundus Exam       Right Left   Disc Pink and sharp, +PPA Pink and sharp, mild temp PPA   C/D Ratio 0.2 0.3   Macula Flat, Good foveal reflex, No heme or edema Flat, Good foveal reflex, mild RPE mottling   Vessels Tortuous Tortuous, Vascular attenuation   Periphery attached; Focal CR atrophy 10:30- no hole; no heme attached; Pigmented cystoid degeneration 10:30-- no hole; no heme           Refraction     Wearing Rx       Sphere Cylinder Axis Add   Right -3.00 +1.50 132 +2.50   Left -4.25 +0.50 067 +2.50           IMAGING AND PROCEDURES  Imaging and Procedures for 07/21/2022  OCT, Retina - OU - Both Eyes       Right Eye Quality was good. Central Foveal Thickness: 275. Progression has no prior data. Findings include normal foveal contour, no IRF, no SRF, myopic contour, vitreomacular  adhesion .   Left Eye Quality was good. Central Foveal Thickness: 279. Progression has no prior data. Findings include normal foveal contour, no IRF, no SRF, myopic contour, vitreomacular adhesion .   Notes *Images captured and stored on drive  Diagnosis / Impression:  NFP; no IRF/SRF OU +VMA and +myopic contour OU  Clinical management:  See below  Abbreviations: NFP - Normal foveal profile. CME - cystoid macular edema. PED - pigment epithelial detachment. IRF - intraretinal fluid. SRF - subretinal fluid. EZ - ellipsoid zone. ERM - epiretinal membrane. ORA - outer retinal atrophy. ORT - outer retinal tubulation. SRHM - subretinal hyper-reflective material. IRHM - intraretinal hyper-reflective material            ASSESSMENT/PLAN:    ICD-10-CM   1. Chorioretinal scar of both eyes  H31.003 OCT, Retina - OU - Both Eyes    2. Vitreomacular adhesion of both eyes  H43.823 OCT, Retina - OU - Both Eyes    3. History of herpes simplex infection  Z86.19     4.  Rheumatoid arthritis of multiple sites with negative rheumatoid factor (HCC)  M06.09     5. Cortical cataract of both eyes  H26.9      Peripheral cystoid degneration / CR scarring and atrophy OU - no frank retinal tears, holes - discussed findings, prognosis, and treatment options including observation - reviewed s/s of RT/RD with patient - f/u 6-8 week, DFE, OCT  2.  Vitreomacular Adhesion, OU  - OCT shows VMA OU.  -monitor for now  3. History of HSV -- multiple recurrences  - affected V2 dermatome  - currently on 1g TID po valtrex  - no active lesions  4. History of RA  - currently on MTX 10 mg + folic acid  - managed by Rheumatololgy  - will check FA next visit for uveitis screening  5. Cortical Cataract OU - The symptoms of cataract, surgical options, and treatments and risks were discussed with patient. - discussed diagnosis and progression - not yet visually significant - monitor for now   Ophthalmic  Meds Ordered this visit:  No orders of the defined types were placed in this encounter.      Return in about 6 weeks (around 09/01/2022) for f/u lattice OU, DFE, OCT, Fluorescein Angiogram to check for posterior uveitis.  There are no Patient Instructions on file for this visit.   Explained the diagnoses, plan, and follow up with the patient and they expressed understanding.  Patient expressed understanding of the importance of proper follow up care.   This document serves as a record of services personally performed by Gardiner Sleeper, MD, PhD. It was created on their behalf by Roselee Nova, COMT. The creation of this record is the provider's dictation and/or activities during the visit.  Electronically signed by: Roselee Nova, COMT 07/21/22 5:26 PM  This document serves as a record of services personally performed by Gardiner Sleeper, MD, PhD. It was created on their behalf by San Jetty. Owens Shark, OA an ophthalmic technician. The creation of this record is the provider's dictation and/or activities during the visit.    Electronically signed by: San Jetty. Owens Shark, [place] 12.18.2023 5:26 PM  This document serves as a record of services personally performed by Gardiner Sleeper, MD, PhD. It was created on their behalf by Renaldo Reel, [place] an ophthalmic technician. The creation of this record is the provider's dictation and/or activities during the visit.    Electronically signed by:  Renaldo Reel, COT  12.18.23 5:26 PM  Gardiner Sleeper, M.D., Ph.D. Diseases & Surgery of the Retina and Vitreous [facility]  I have reviewed the above documentation for accuracy and completeness, and I agree with the above. Gardiner Sleeper, M.D., Ph.D. 07/21/22 5:26 PM   Abbreviations: M myopia (nearsighted); A astigmatism; H hyperopia (farsighted); P presbyopia; Mrx spectacle prescription;  CTL contact lenses; OD right eye; OS left eye; OU both eyes  XT exotropia; ET esotropia; PEK  punctate epithelial keratitis; PEE punctate epithelial erosions; DES dry eye syndrome; MGD meibomian gland dysfunction; ATs artificial tears; PFAT's preservative free artificial tears; [place] nuclear sclerotic cataract; PSC posterior subcapsular cataract; ERM epi-retinal membrane; PVD posterior vitreous detachment; RD retinal detachment; DM diabetes mellitus; DR diabetic retinopathy; NPDR non-proliferative diabetic retinopathy; PDR proliferative diabetic retinopathy; CSME clinically significant macular edema; DME diabetic macular edema; dbh dot blot hemorrhages; CWS cotton wool spot; POAG primary open angle glaucoma; C/D cup-to-disc ratio; HVF humphrey visual field; GVF goldmann visual field; OCT optical coherence tomography; IOP intraocular pressure;  BRVO Branch retinal vein occlusion; CRVO central retinal vein occlusion; CRAO central retinal artery occlusion; BRAO branch retinal artery occlusion; RT retinal tear; SB scleral buckle; PPV pars plana vitrectomy; VH Vitreous hemorrhage; PRP panretinal laser photocoagulation; IVK intravitreal kenalog; VMT vitreomacular traction; MH Macular hole;  NVD neovascularization of the disc; NVE neovascularization elsewhere; AREDS age related eye disease study; ARMD age related macular degeneration; POAG primary open angle glaucoma; EBMD epithelial/anterior basement membrane dystrophy; ACIOL anterior chamber intraocular lens; IOL intraocular lens; PCIOL posterior chamber intraocular lens; Phaco/IOL phacoemulsification with intraocular lens placement; [place] photorefractive keratectomy; LASIK laser assisted in situ keratomileusis; HTN hypertension; DM diabetes mellitus; COPD chronic obstructive pulmonary disease

## 2022-07-21 ENCOUNTER — Encounter (INDEPENDENT_AMBULATORY_CARE_PROVIDER_SITE_OTHER): Payer: Self-pay | Admitting: Ophthalmology

## 2022-07-21 ENCOUNTER — Ambulatory Visit (INDEPENDENT_AMBULATORY_CARE_PROVIDER_SITE_OTHER): Payer: 59 | Admitting: Ophthalmology

## 2022-07-21 DIAGNOSIS — H43823 Vitreomacular adhesion, bilateral: Secondary | ICD-10-CM

## 2022-07-21 DIAGNOSIS — H269 Unspecified cataract: Secondary | ICD-10-CM

## 2022-07-21 DIAGNOSIS — H31003 Unspecified chorioretinal scars, bilateral: Secondary | ICD-10-CM | POA: Diagnosis not present

## 2022-07-21 DIAGNOSIS — Z8619 Personal history of other infectious and parasitic diseases: Secondary | ICD-10-CM

## 2022-07-21 DIAGNOSIS — M0609 Rheumatoid arthritis without rheumatoid factor, multiple sites: Secondary | ICD-10-CM

## 2022-07-22 ENCOUNTER — Other Ambulatory Visit (HOSPITAL_COMMUNITY): Payer: Self-pay

## 2022-07-22 ENCOUNTER — Ambulatory Visit (INDEPENDENT_AMBULATORY_CARE_PROVIDER_SITE_OTHER): Payer: 59 | Admitting: Family Medicine

## 2022-07-24 ENCOUNTER — Other Ambulatory Visit (INDEPENDENT_AMBULATORY_CARE_PROVIDER_SITE_OTHER): Payer: 59

## 2022-07-24 DIAGNOSIS — E063 Autoimmune thyroiditis: Secondary | ICD-10-CM

## 2022-07-24 DIAGNOSIS — E348 Other specified endocrine disorders: Secondary | ICD-10-CM

## 2022-07-24 LAB — TSH: TSH: 1.66 u[IU]/mL (ref 0.35–5.50)

## 2022-07-24 LAB — T3, FREE: T3, Free: 3.5 pg/mL (ref 2.3–4.2)

## 2022-07-24 LAB — T4, FREE: Free T4: 1 ng/dL (ref 0.60–1.60)

## 2022-07-25 DIAGNOSIS — B999 Unspecified infectious disease: Secondary | ICD-10-CM | POA: Diagnosis not present

## 2022-07-25 DIAGNOSIS — Z0000 Encounter for general adult medical examination without abnormal findings: Secondary | ICD-10-CM | POA: Diagnosis not present

## 2022-07-25 DIAGNOSIS — Z889 Allergy status to unspecified drugs, medicaments and biological substances status: Secondary | ICD-10-CM | POA: Diagnosis not present

## 2022-07-25 DIAGNOSIS — M1990 Unspecified osteoarthritis, unspecified site: Secondary | ICD-10-CM | POA: Diagnosis not present

## 2022-07-25 DIAGNOSIS — B009 Herpesviral infection, unspecified: Secondary | ICD-10-CM | POA: Diagnosis not present

## 2022-07-30 ENCOUNTER — Other Ambulatory Visit (HOSPITAL_COMMUNITY): Payer: Self-pay

## 2022-07-30 DIAGNOSIS — M1990 Unspecified osteoarthritis, unspecified site: Secondary | ICD-10-CM | POA: Diagnosis not present

## 2022-07-30 DIAGNOSIS — H938X1 Other specified disorders of right ear: Secondary | ICD-10-CM | POA: Diagnosis not present

## 2022-07-30 DIAGNOSIS — R270 Ataxia, unspecified: Secondary | ICD-10-CM | POA: Diagnosis not present

## 2022-07-30 DIAGNOSIS — R200 Anesthesia of skin: Secondary | ICD-10-CM | POA: Diagnosis not present

## 2022-07-30 DIAGNOSIS — H04123 Dry eye syndrome of bilateral lacrimal glands: Secondary | ICD-10-CM | POA: Diagnosis not present

## 2022-07-30 DIAGNOSIS — Z8249 Family history of ischemic heart disease and other diseases of the circulatory system: Secondary | ICD-10-CM | POA: Diagnosis not present

## 2022-07-30 DIAGNOSIS — B009 Herpesviral infection, unspecified: Secondary | ICD-10-CM | POA: Diagnosis not present

## 2022-07-30 LAB — GROWTH HORMONE: Growth Hormone: 0.1 ng/mL (ref <=7.1)

## 2022-07-30 LAB — INSULIN-LIKE GROWTH FACTOR
IGF-I, LC/MS: 97 ng/mL (ref 50–317)
Z-Score (Female): -0.7 SD (ref ?–2.0)

## 2022-08-07 DIAGNOSIS — M1990 Unspecified osteoarthritis, unspecified site: Secondary | ICD-10-CM | POA: Diagnosis not present

## 2022-08-07 DIAGNOSIS — R7989 Other specified abnormal findings of blood chemistry: Secondary | ICD-10-CM | POA: Diagnosis not present

## 2022-08-07 DIAGNOSIS — Z8249 Family history of ischemic heart disease and other diseases of the circulatory system: Secondary | ICD-10-CM | POA: Diagnosis not present

## 2022-08-07 DIAGNOSIS — R6884 Jaw pain: Secondary | ICD-10-CM | POA: Diagnosis not present

## 2022-08-07 DIAGNOSIS — R760 Raised antibody titer: Secondary | ICD-10-CM | POA: Diagnosis not present

## 2022-08-07 DIAGNOSIS — M542 Cervicalgia: Secondary | ICD-10-CM | POA: Diagnosis not present

## 2022-08-07 DIAGNOSIS — M6281 Muscle weakness (generalized): Secondary | ICD-10-CM | POA: Diagnosis not present

## 2022-08-07 DIAGNOSIS — Z79899 Other long term (current) drug therapy: Secondary | ICD-10-CM | POA: Diagnosis not present

## 2022-08-07 DIAGNOSIS — R519 Headache, unspecified: Secondary | ICD-10-CM | POA: Diagnosis not present

## 2022-08-07 DIAGNOSIS — B009 Herpesviral infection, unspecified: Secondary | ICD-10-CM | POA: Diagnosis not present

## 2022-08-14 NOTE — Progress Notes (Addendum)
[facility] Note  08/20/2022     CHIEF COMPLAINT Patient presents for Retina Follow Up   HISTORY OF PRESENT ILLNESS: Robin Maynard is a 57 y.o. female who presents to the clinic today for:   HPI     Retina Follow Up   Patient presents with  Other.  In both eyes.  This started 4 weeks ago.  I, the attending physician,  performed the HPI with the patient and updated documentation appropriately.        Comments   Patient here for 4 weeks retina follow up for Chorioretinal scar OU. Patient states vision is okay. Feels like has something in OD for last 2 days but today feels like a little scratch. No eye pain. Not using drops.      Last edited by Bernarda Caffey, MD on 08/20/2022  4:57 PM.    Patient here for 4 week follow-up for chorioretinal scars OU. Patient states vision is doing OK OU. Occasional foreign body sensation OD for the last 2 days.   Referring physician: Mercie Eon, MD [place],  [place] [address]  HISTORICAL INFORMATION:   Selected notes from the MEDICAL RECORD NUMBER Referred by Dr. Jerline Pain LEE:  Ocular Hx- PMH-    CURRENT MEDICATIONS: No current outpatient medications on file. (Ophthalmic Drugs)   No current facility-administered medications for this visit. (Ophthalmic Drugs)   Current Outpatient Medications (Other)  Medication Sig   celecoxib (CELEBREX) 50 MG capsule Take 2 capsules (100 mg total) by mouth 2 (two)  times daily as needed for Pain.   celecoxib (CELEBREX) 50 MG capsule Take 2 capsules (100 mg total) by mouth 2 (two)  times daily as needed for Pain.   folic acid (FOLVITE) 1 MG tablet Take 1 tablet (1 mg total) by mouth daily.   Magnesium Gluconate 550 MG TABS Take 550 mg by mouth. 2 to 3 times a week   methotrexate (RHEUMATREX) 2.5 MG tablet Take 4 tablets (10 mg total) by mouth once a week.   Multiple Vitamin (MULTI-VITAMIN) tablet Take 1 tablet by mouth daily.   Omega-3 1000 MG CAPS  Take by mouth.   predniSONE (DELTASONE) 5 MG tablet Take 3 tablets (15 mg total) by mouth daily for 3 days, THEN 2 tablets (10 mg total) daily for 3 days, THEN 1.5 tablets (7.5 mg total) daily for 3 days, THEN 1 tablet (5 mg total) daily for 3 days.   Semaglutide (RYBELSUS) 3 MG TABS Take 1 tablet by mouth daily.   sulfaSALAzine (AZULFIDINE) 500 MG EC tablet Take 1 tablet (500 mg total) by mouth 2 (two) times daily with food. Start 1 pill daily with food for 2 weeks then increase to twice daily.   SUMAtriptan (IMITREX) 50 MG tablet Take 1 tablet (50 mg total) by mouth once as needed for up to 1 dose for Migraine.   Turmeric 400 MG CAPS See admin instructions.   valACYclovir (VALTREX) 1000 MG tablet Take 1 tablet by mouth three times a day   valACYclovir (VALTREX) 500 MG tablet Take 1 tablet (500 mg total) by mouth 2 (two) times daily.   zolpidem (AMBIEN) 10 MG tablet TAKE 1 TABLET BY MOUTH EVERY NIGHT AT BEDTIME   esomeprazole (NEXIUM) 40 MG capsule TAKE 1 CAPSULE (40 MG TOTAL) BY MOUTH DAILY. (Patient taking differently: Take by mouth as needed.)   No current facility-administered medications for this visit. (Other)   REVIEW OF SYSTEMS: ROS  Positive for: Musculoskeletal, Endocrine, Eyes Negative for: Constitutional, Gastrointestinal, Neurological, Skin, Genitourinary, HENT, Cardiovascular, Respiratory, Psychiatric, Allergic/Imm, Heme/Lymph Last edited by Jobe Marker, COT on 08/20/2022 10:27 AM.     ALLERGIES Allergies  Allergen Reactions   Doxycycline Nausea And Vomiting   Flulaval Quadrivalent [Influenza Vac Split Quad] Other (See Comments)    Weakness and don't feel good from the vaccine   Hydroxychloroquine Other (See Comments)    Nausea/malaise/reflux    PAST MEDICAL HISTORY Past Medical History:  Diagnosis Date   Acute pancreatitis    Autoimmune thyroiditis    Constipation    Diverticulitis    Gastritis    GERD (gastroesophageal reflux disease)    GI work up in  progress   Hashimoto's thyroiditis    Headache    Heartburn    Herpes    Joint pain    Mass on back    Obesity    Pancreatic disease    PONV (postoperative nausea and vomiting)    Rheumatoid arthritis ([place])    SOBOE (shortness of breath on exertion)    Thyroid condition    Vitamin D deficiency    Past Surgical History:  Procedure Laterality Date   COLONOSCOPY     DILITATION & CURRETTAGE/HYSTROSCOPY WITH HYDROTHERMAL ABLATION N/A 12/26/2016   Procedure: DILATATION & CURETTAGE/HYSTEROSCOPY WITH HYDROTHERMAL ABLATION;  Surgeon: Dian Queen, MD;  Location: [facility];  Service: Gynecology;  Laterality: N/A;   MASS EXCISION Right 02/23/2018   Procedure: EXCISION BACK MASS ERAS PATHWAY;  Surgeon: Rolm Bookbinder, MD;  Location: [facility];  Service: General;  Laterality: Right;   POLYPECTOMY     TONSILLECTOMY AND ADENOIDECTOMY     TYMPANOSTOMY TUBE PLACEMENT     FAMILY HISTORY Family History  Problem Relation Age of Onset   Deep vein thrombosis Mother    Colon polyps Mother    Heart disease Mother    Depression Mother    Anxiety disorder Mother    Schizophrenia Mother    Obesity Mother    Cancer Father    Hyperlipidemia Father    Gastric cancer Father    Diabetes Father    Heart disease Father    Hypertension Father    Stomach cancer Father    Diabetes Paternal Grandmother    Heart disease Paternal Grandmother    Aortic stenosis Son    Breast cancer Maternal Aunt    Colon cancer Neg Hx    Allergic rhinitis Neg Hx    Asthma Neg Hx    Angioedema Neg Hx    Eczema Neg Hx    Urticaria Neg Hx    Rectal cancer Neg Hx    Esophageal cancer Neg Hx    SOCIAL HISTORY Social History   Tobacco Use   Smoking status: Never    Passive exposure: Current   Smokeless tobacco: Never  Vaping Use   Vaping Use: Never used  Substance Use Topics   Alcohol use: No   Drug use: No       OPHTHALMIC EXAM:  Base Eye Exam     Visual Acuity  (Snellen - Linear)       Right Left   Dist cc 20/25 -1 20/20 -1   Dist ph cc 20/20     Correction: Glasses         Tonometry (Tonopen, 9:24 AM)       Right Left   Pressure 18 19         Pupils  Dark Light Shape React APD   Right 3 2 Round Brisk None   Left 3 2 Round Brisk None         Visual Fields (Counting fingers)       Left Right    Full Full         Extraocular Movement       Right Left    Full, Ortho Full, Ortho         Neuro/Psych     Oriented x3: Yes   Mood/Affect: Normal         Dilation     Both eyes: 1.0% Mydriacyl, 2.5% Phenylephrine @ 9:24 AM           Slit Lamp and Fundus Exam     External Exam       Right Left   External Normal Normal         Slit Lamp Exam       Right Left   Lids/Lashes Meibomian gland dysfunction Meibomian gland dysfunction   Conjunctiva/Sclera White and quiet White and quiet   Cornea Debris in tear film, 1-2 + fine PEE +Debris in tear film, 1+ fine PEE   Anterior Chamber Deep, 1+ fine pigment, no flare Deep and .5+ fine cell and pigment, no flare   Iris Round and dilated Round and dialted   Lens 1-2+ cortical 1-2+ cortical   Anterior Vitreous Vitreous syneresis, no cell, Vitreous condensations Vitreous syneresis, Vitreous condensations         Fundus Exam       Right Left   Disc Pink and sharp, +PPA Pink and sharp, mild temp PPA   C/D Ratio 0.2 0.3   Macula Flat, Good foveal reflex, No heme or edema Flat, Good foveal reflex, mild RPE mottling   Vessels Tortuous Tortuous, Vascular attenuation   Periphery attached; Focal CR atrophy 10:30- no hole; no heme attached; faint CR scar vs tuft @ 0300, chorioretinal scar at 1030--no hole, no heme           Refraction     Wearing Rx       Sphere Cylinder Axis Add   Right -3.00 +1.50 132 +2.50   Left -4.25 +0.50 067 +2.50           IMAGING AND PROCEDURES  Imaging and Procedures for 08/20/2022  OCT, Retina - OU - Both Eyes        Right Eye Quality was good. Central Foveal Thickness: 275. Progression has been stable. Findings include normal foveal contour, no IRF, no SRF, myopic contour, vitreomacular adhesion .   Left Eye Quality was good. Central Foveal Thickness: 279. Progression has been stable. Findings include normal foveal contour, no IRF, no SRF, myopic contour, vitreomacular adhesion .   Notes *Images captured and stored on drive  Diagnosis / Impression:  NFP; no IRF/SRF OU +VMA and +myopic contour OU  Clinical management:  See below  Abbreviations: NFP - Normal foveal profile. CME - cystoid macular edema. PED - pigment epithelial detachment. IRF - intraretinal fluid. SRF - subretinal fluid. EZ - ellipsoid zone. ERM - epiretinal membrane. ORA - outer retinal atrophy. ORT - outer retinal tubulation. SRHM - subretinal hyper-reflective material. IRHM - intraretinal hyper-reflective material      Fluorescein Angiography Optos (Transit OD)       Right Eye Progression has no prior data. Early phase findings include normal observations, staining. Mid/Late phase findings include normal observations, staining (Focal staining superotemporal periphery corresponding to CR scar; no leakage  or vasculitis). Choroidal neovascularization is not present.   Left Eye Progression has no prior data. Early phase findings include normal observations, staining. Mid/Late phase findings include normal observations, staining (Focal staining temporal periphery; no leakage or vasculitis). Choroidal neovascularization is not present.   Notes **Images stored on drive**  Impression: OD: Focal staining superotemporal periphery corresponding to CR scar; no leakage or vasculitis OS: Focal staining temporal periphery; no leakage or vasculitis            ASSESSMENT/PLAN:    ICD-10-CM   1. Chorioretinal scar of both eyes  H31.003 OCT, Retina - OU - Both Eyes    Fluorescein Angiography Optos (Transit OD)    2.  Vitreomacular adhesion of both eyes  H43.823     3. History of herpes simplex infection  Z86.19     4. Rheumatoid arthritis of multiple sites with negative rheumatoid factor (HCC)  M06.09 Fluorescein Angiography Optos (Transit OD)    5. Cortical cataract of both eyes  H26.9       Peripheral cystoid degneration / CR scarring and atrophy OU - stable -- no frank retinal tears, holes - FA 01.17.24: OD shows focal staining superotemporal periphery corresponding to CR scar; no leakage or vasculitis, OS shows focal staining temporal periphery; no leakage or vasculitis - discussed findings, prognosis, and treatment options including observation - no retinal or ophthalmic interventions indicated or recommended  - monitor - f/u 6-9 months, DFE, OCT  2.  Vitreomacular Adhesion, OU  - OCT shows VMA OU -- stable.  - monitor  3. History of HSV -- multiple recurrences  - affected V2 dermatome  - currently on 1g TID po valtrex  - no active lesions  4. History of RA  - managed by Dr. Georga Bora Rheumatololgy  - currently on MTX 10 mg + folic acid  - no evidence of uveitis OU on exam, imaging or fluorescein angiography  - FA 01.17.24 -- no leakage or vasculitis OU  5. Cortical Cataract OU - The symptoms of cataract, surgical options, and treatments and risks were discussed with patient. - discussed diagnosis and progression - not yet visually significant - monitor for now   Ophthalmic Meds Ordered this visit:  No orders of the defined types were placed in this encounter.    Return 6-9 months, for DFE, OCT.  There are no Patient Instructions on file for this visit.   Explained the diagnoses, plan, and follow up with the patient and they expressed understanding.  Patient expressed understanding of the importance of proper follow up care.   This document serves as a record of services personally performed by Gardiner Sleeper, MD, PhD. It was created on their behalf by Orvan Falconer, an  ophthalmic technician. The creation of this record is the provider's dictation and/or activities during the visit.    Electronically signed by: Orvan Falconer, OA, 08/20/22  5:08 PM  This document serves as a record of services personally performed by Gardiner Sleeper, MD, PhD. It was created on their behalf by Roselee Nova, COMT. The creation of this record is the provider's dictation and/or activities during the visit.  Electronically signed by: Roselee Nova, COMT 08/20/22 5:08 PM  Gardiner Sleeper, M.D., Ph.D. Diseases & Surgery of the Retina and Vitreous [facility]  I have reviewed the above documentation for accuracy and completeness, and I agree with the above. Gardiner Sleeper, M.D., Ph.D. 08/20/22 5:08 PM  Abbreviations: M myopia (nearsighted); A astigmatism;  H hyperopia (farsighted); P presbyopia; Mrx spectacle prescription;  CTL contact lenses; OD right eye; OS left eye; OU both eyes  XT exotropia; ET esotropia; PEK punctate epithelial keratitis; PEE punctate epithelial erosions; DES dry eye syndrome; MGD meibomian gland dysfunction; ATs artificial tears; PFAT's preservative free artificial tears; [place] nuclear sclerotic cataract; PSC posterior subcapsular cataract; ERM epi-retinal membrane; PVD posterior vitreous detachment; RD retinal detachment; DM diabetes mellitus; DR diabetic retinopathy; NPDR non-proliferative diabetic retinopathy; PDR proliferative diabetic retinopathy; CSME clinically significant macular edema; DME diabetic macular edema; dbh dot blot hemorrhages; CWS cotton wool spot; POAG primary open angle glaucoma; C/D cup-to-disc ratio; HVF humphrey visual field; GVF goldmann visual field; OCT optical coherence tomography; IOP intraocular pressure; BRVO Branch retinal vein occlusion; CRVO central retinal vein occlusion; CRAO central retinal artery occlusion; BRAO branch retinal artery occlusion; RT retinal tear; SB scleral buckle; PPV pars plana vitrectomy;  VH Vitreous hemorrhage; PRP panretinal laser photocoagulation; IVK intravitreal kenalog; VMT vitreomacular traction; MH Macular hole;  NVD neovascularization of the disc; NVE neovascularization elsewhere; AREDS age related eye disease study; ARMD age related macular degeneration; POAG primary open angle glaucoma; EBMD epithelial/anterior basement membrane dystrophy; ACIOL anterior chamber intraocular lens; IOL intraocular lens; PCIOL posterior chamber intraocular lens; Phaco/IOL phacoemulsification with intraocular lens placement; [place] photorefractive keratectomy; LASIK laser assisted in situ keratomileusis; HTN hypertension; DM diabetes mellitus; COPD chronic obstructive pulmonary disease

## 2022-08-20 ENCOUNTER — Ambulatory Visit (INDEPENDENT_AMBULATORY_CARE_PROVIDER_SITE_OTHER): Payer: Commercial Managed Care - PPO | Admitting: Ophthalmology

## 2022-08-20 ENCOUNTER — Encounter (INDEPENDENT_AMBULATORY_CARE_PROVIDER_SITE_OTHER): Payer: Self-pay | Admitting: Ophthalmology

## 2022-08-20 DIAGNOSIS — M0609 Rheumatoid arthritis without rheumatoid factor, multiple sites: Secondary | ICD-10-CM | POA: Diagnosis not present

## 2022-08-20 DIAGNOSIS — Z8619 Personal history of other infectious and parasitic diseases: Secondary | ICD-10-CM

## 2022-08-20 DIAGNOSIS — H43823 Vitreomacular adhesion, bilateral: Secondary | ICD-10-CM

## 2022-08-20 DIAGNOSIS — H31003 Unspecified chorioretinal scars, bilateral: Secondary | ICD-10-CM | POA: Diagnosis not present

## 2022-08-20 DIAGNOSIS — H269 Unspecified cataract: Secondary | ICD-10-CM

## 2022-08-25 DIAGNOSIS — M542 Cervicalgia: Secondary | ICD-10-CM | POA: Diagnosis not present

## 2022-08-25 DIAGNOSIS — M6281 Muscle weakness (generalized): Secondary | ICD-10-CM | POA: Diagnosis not present

## 2022-08-25 DIAGNOSIS — R519 Headache, unspecified: Secondary | ICD-10-CM | POA: Diagnosis not present

## 2022-08-25 DIAGNOSIS — R6884 Jaw pain: Secondary | ICD-10-CM | POA: Diagnosis not present

## 2022-08-28 ENCOUNTER — Other Ambulatory Visit: Payer: Self-pay

## 2022-08-28 ENCOUNTER — Other Ambulatory Visit (HOSPITAL_COMMUNITY): Payer: Self-pay

## 2022-08-28 DIAGNOSIS — M1990 Unspecified osteoarthritis, unspecified site: Secondary | ICD-10-CM | POA: Diagnosis not present

## 2022-08-28 DIAGNOSIS — H9201 Otalgia, right ear: Secondary | ICD-10-CM | POA: Diagnosis not present

## 2022-08-28 DIAGNOSIS — B009 Herpesviral infection, unspecified: Secondary | ICD-10-CM | POA: Diagnosis not present

## 2022-08-28 DIAGNOSIS — E063 Autoimmune thyroiditis: Secondary | ICD-10-CM | POA: Diagnosis not present

## 2022-08-28 MED ORDER — VALACYCLOVIR HCL 1 G PO TABS
1000.0000 mg | ORAL_TABLET | Freq: Two times a day (BID) | ORAL | 2 refills | Status: DC
  Filled 2022-08-28: qty 60, 30d supply, fill #0
  Filled 2022-10-20: qty 60, 30d supply, fill #1

## 2022-08-30 DIAGNOSIS — R519 Headache, unspecified: Secondary | ICD-10-CM | POA: Diagnosis not present

## 2022-08-30 DIAGNOSIS — H938X1 Other specified disorders of right ear: Secondary | ICD-10-CM | POA: Diagnosis not present

## 2022-09-03 DIAGNOSIS — B009 Herpesviral infection, unspecified: Secondary | ICD-10-CM | POA: Diagnosis not present

## 2022-09-04 DIAGNOSIS — R519 Headache, unspecified: Secondary | ICD-10-CM | POA: Diagnosis not present

## 2022-09-04 DIAGNOSIS — M6281 Muscle weakness (generalized): Secondary | ICD-10-CM | POA: Diagnosis not present

## 2022-09-04 DIAGNOSIS — R6884 Jaw pain: Secondary | ICD-10-CM | POA: Diagnosis not present

## 2022-09-04 DIAGNOSIS — M542 Cervicalgia: Secondary | ICD-10-CM | POA: Diagnosis not present

## 2022-09-08 ENCOUNTER — Encounter (INDEPENDENT_AMBULATORY_CARE_PROVIDER_SITE_OTHER): Payer: Commercial Managed Care - PPO | Admitting: Ophthalmology

## 2022-09-11 ENCOUNTER — Other Ambulatory Visit: Payer: Self-pay | Admitting: Otolaryngology

## 2022-09-11 DIAGNOSIS — M542 Cervicalgia: Secondary | ICD-10-CM | POA: Diagnosis not present

## 2022-09-11 DIAGNOSIS — H9201 Otalgia, right ear: Secondary | ICD-10-CM | POA: Diagnosis not present

## 2022-09-11 DIAGNOSIS — R519 Headache, unspecified: Secondary | ICD-10-CM | POA: Diagnosis not present

## 2022-09-11 DIAGNOSIS — M6281 Muscle weakness (generalized): Secondary | ICD-10-CM | POA: Diagnosis not present

## 2022-09-11 DIAGNOSIS — R6884 Jaw pain: Secondary | ICD-10-CM | POA: Diagnosis not present

## 2022-09-12 ENCOUNTER — Other Ambulatory Visit (HOSPITAL_COMMUNITY): Payer: Self-pay | Admitting: Otolaryngology

## 2022-09-12 ENCOUNTER — Encounter (HOSPITAL_COMMUNITY): Payer: Self-pay | Admitting: Otolaryngology

## 2022-09-12 DIAGNOSIS — H9201 Otalgia, right ear: Secondary | ICD-10-CM

## 2022-09-24 DIAGNOSIS — R519 Headache, unspecified: Secondary | ICD-10-CM | POA: Diagnosis not present

## 2022-09-24 DIAGNOSIS — H8111 Benign paroxysmal vertigo, right ear: Secondary | ICD-10-CM | POA: Diagnosis not present

## 2022-09-24 DIAGNOSIS — M6281 Muscle weakness (generalized): Secondary | ICD-10-CM | POA: Diagnosis not present

## 2022-09-24 DIAGNOSIS — M542 Cervicalgia: Secondary | ICD-10-CM | POA: Diagnosis not present

## 2022-09-24 DIAGNOSIS — R6884 Jaw pain: Secondary | ICD-10-CM | POA: Diagnosis not present

## 2022-09-25 ENCOUNTER — Encounter (HOSPITAL_COMMUNITY): Payer: Self-pay

## 2022-09-25 ENCOUNTER — Ambulatory Visit (HOSPITAL_COMMUNITY)
Admission: RE | Admit: 2022-09-25 | Discharge: 2022-09-25 | Disposition: A | Discharge status: Home / Self Care | Payer: Commercial Managed Care - PPO | Source: Ambulatory Visit | Attending: Otolaryngology | Admitting: Otolaryngology

## 2022-09-25 DIAGNOSIS — H9201 Otalgia, right ear: Secondary | ICD-10-CM | POA: Insufficient documentation

## 2022-09-25 DIAGNOSIS — I6521 Occlusion and stenosis of right carotid artery: Secondary | ICD-10-CM | POA: Diagnosis not present

## 2022-09-25 MED ORDER — SODIUM CHLORIDE (PF) 0.9 % IJ SOLN
INTRAMUSCULAR | Status: AC
  Filled 2022-09-25: qty 50

## 2022-09-25 MED ORDER — IOHEXOL 300 MG/ML  SOLN
75.0000 mL | Freq: Once | INTRAMUSCULAR | Status: AC | PRN
  Administered 2022-09-25: 75 mL via INTRAVENOUS

## 2022-09-29 DIAGNOSIS — M542 Cervicalgia: Secondary | ICD-10-CM | POA: Diagnosis not present

## 2022-09-29 DIAGNOSIS — M6281 Muscle weakness (generalized): Secondary | ICD-10-CM | POA: Diagnosis not present

## 2022-09-29 DIAGNOSIS — R519 Headache, unspecified: Secondary | ICD-10-CM | POA: Diagnosis not present

## 2022-09-29 DIAGNOSIS — R6884 Jaw pain: Secondary | ICD-10-CM | POA: Diagnosis not present

## 2022-10-01 ENCOUNTER — Other Ambulatory Visit (HOSPITAL_COMMUNITY): Payer: Self-pay

## 2022-10-01 DIAGNOSIS — M542 Cervicalgia: Secondary | ICD-10-CM | POA: Diagnosis not present

## 2022-10-01 DIAGNOSIS — E7800 Pure hypercholesterolemia, unspecified: Secondary | ICD-10-CM | POA: Diagnosis not present

## 2022-10-01 DIAGNOSIS — I6521 Occlusion and stenosis of right carotid artery: Secondary | ICD-10-CM | POA: Diagnosis not present

## 2022-10-01 MED ORDER — ROSUVASTATIN CALCIUM 5 MG PO TABS
ORAL_TABLET | ORAL | 1 refills | Status: DC
  Filled 2022-10-01: qty 90, 90d supply, fill #0
  Filled 2022-12-17: qty 90, 90d supply, fill #1

## 2022-10-01 MED ORDER — ASPIRIN 81 MG PO TBEC
DELAYED_RELEASE_TABLET | ORAL | 0 refills | Status: AC
  Filled 2022-11-03: qty 30, 30d supply, fill #0

## 2022-10-02 ENCOUNTER — Other Ambulatory Visit: Payer: Self-pay | Admitting: Family Medicine

## 2022-10-02 DIAGNOSIS — M542 Cervicalgia: Secondary | ICD-10-CM

## 2022-10-05 ENCOUNTER — Other Ambulatory Visit (HOSPITAL_COMMUNITY): Payer: Self-pay

## 2022-10-06 ENCOUNTER — Other Ambulatory Visit: Payer: Self-pay

## 2022-10-06 ENCOUNTER — Other Ambulatory Visit (HOSPITAL_COMMUNITY): Payer: Self-pay

## 2022-10-06 DIAGNOSIS — R270 Ataxia, unspecified: Secondary | ICD-10-CM | POA: Diagnosis not present

## 2022-10-06 DIAGNOSIS — B009 Herpesviral infection, unspecified: Secondary | ICD-10-CM | POA: Diagnosis not present

## 2022-10-06 DIAGNOSIS — M130 Polyarthritis, unspecified: Secondary | ICD-10-CM | POA: Diagnosis not present

## 2022-10-06 DIAGNOSIS — R200 Anesthesia of skin: Secondary | ICD-10-CM | POA: Diagnosis not present

## 2022-10-06 MED ORDER — ZOLPIDEM TARTRATE 10 MG PO TABS
10.0000 mg | ORAL_TABLET | Freq: Every day | ORAL | 3 refills | Status: AC
  Filled 2022-10-06: qty 30, 30d supply, fill #0
  Filled 2022-11-03: qty 30, 30d supply, fill #1
  Filled 2022-12-17: qty 30, 30d supply, fill #2
  Filled 2022-12-30: qty 30, 30d supply, fill #3

## 2022-10-07 DIAGNOSIS — R6884 Jaw pain: Secondary | ICD-10-CM | POA: Diagnosis not present

## 2022-10-07 DIAGNOSIS — M542 Cervicalgia: Secondary | ICD-10-CM | POA: Diagnosis not present

## 2022-10-07 DIAGNOSIS — M6281 Muscle weakness (generalized): Secondary | ICD-10-CM | POA: Diagnosis not present

## 2022-10-07 DIAGNOSIS — R519 Headache, unspecified: Secondary | ICD-10-CM | POA: Diagnosis not present

## 2022-10-08 ENCOUNTER — Other Ambulatory Visit: Payer: Self-pay

## 2022-10-08 ENCOUNTER — Other Ambulatory Visit (HOSPITAL_COMMUNITY): Payer: Self-pay

## 2022-10-08 MED ORDER — FOLIC ACID 1 MG PO TABS
1.0000 mg | ORAL_TABLET | Freq: Every day | ORAL | 3 refills | Status: DC
  Filled 2022-10-08: qty 90, 90d supply, fill #0
  Filled 2022-12-30: qty 90, 90d supply, fill #1

## 2022-10-08 MED ORDER — METHOTREXATE SODIUM 2.5 MG PO TABS
10.0000 mg | ORAL_TABLET | ORAL | 0 refills | Status: DC
  Filled 2022-10-08: qty 16, 28d supply, fill #0

## 2022-10-15 ENCOUNTER — Other Ambulatory Visit: Payer: Self-pay

## 2022-10-15 DIAGNOSIS — I6529 Occlusion and stenosis of unspecified carotid artery: Secondary | ICD-10-CM

## 2022-10-16 DIAGNOSIS — R519 Headache, unspecified: Secondary | ICD-10-CM | POA: Diagnosis not present

## 2022-10-16 DIAGNOSIS — M6281 Muscle weakness (generalized): Secondary | ICD-10-CM | POA: Diagnosis not present

## 2022-10-16 DIAGNOSIS — M542 Cervicalgia: Secondary | ICD-10-CM | POA: Diagnosis not present

## 2022-10-16 DIAGNOSIS — R6884 Jaw pain: Secondary | ICD-10-CM | POA: Diagnosis not present

## 2022-10-20 ENCOUNTER — Other Ambulatory Visit (HOSPITAL_COMMUNITY): Payer: Self-pay

## 2022-10-22 ENCOUNTER — Encounter: Payer: Self-pay | Admitting: Vascular Surgery

## 2022-10-22 ENCOUNTER — Ambulatory Visit: Payer: Commercial Managed Care - PPO | Admitting: Vascular Surgery

## 2022-10-22 ENCOUNTER — Ambulatory Visit (INDEPENDENT_AMBULATORY_CARE_PROVIDER_SITE_OTHER): Payer: Commercial Managed Care - PPO

## 2022-10-22 VITALS — BP 110/73 | HR 75 | Temp 98.1°F | Ht 64.0 in | Wt 176.0 lb

## 2022-10-22 DIAGNOSIS — I6521 Occlusion and stenosis of right carotid artery: Secondary | ICD-10-CM | POA: Diagnosis not present

## 2022-10-22 DIAGNOSIS — I6529 Occlusion and stenosis of unspecified carotid artery: Secondary | ICD-10-CM

## 2022-10-22 NOTE — Progress Notes (Signed)
[facility]  Patient name: Robin Maynard MRN: [id number] DOB: 1965/12/01 Sex: female  REASON FOR CONSULT: Evaluation abnormal right carotid CT scan  HPI: Robin Maynard is a 57 y.o. female, who is here today for evaluation of abnormal carotid artery on recent CT scan.  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  She has an unusual presentation.  She has had difficulty with pain and numbness on the right side of her neck.  She has had a evaluation with Dr. Constance Holster with ENT.  She has had no defecation as to cause.  She had an incidental finding of irregularity in her right carotid artery and is here today for discussion of this.  She has no prior history of neurologic deficits.  Specifically no history of amaurosis fugax, transient ischemic attack or stroke.  She does not smoke cigarettes.  She has no atherosclerotic risk factors.  Past Medical History:  Diagnosis Date   Acute pancreatitis    Autoimmune thyroiditis    Constipation    Diverticulitis    Gastritis    GERD (gastroesophageal reflux disease)    GI work up in progress   Hashimoto's thyroiditis    Headache    Heartburn    Herpes    Joint pain    Mass on back    Obesity    Pancreatic disease    PONV (postoperative nausea and vomiting)    Rheumatoid arthritis ([place])    SOBOE (shortness of breath on exertion)    Thyroid condition    Vitamin D deficiency     Family History  Problem Relation Age of Onset   Deep vein thrombosis Mother    Colon polyps Mother    Heart disease Mother    Depression Mother    Anxiety disorder Mother    Schizophrenia Mother    Obesity Mother    Cancer Father    Hyperlipidemia Father    Gastric cancer Father    Diabetes Father    Heart disease Father    Hypertension Father    Stomach cancer Father    Diabetes Paternal Grandmother    Heart disease Paternal Grandmother    Aortic stenosis  Son    Breast cancer Maternal Aunt    Colon cancer Neg Hx    Allergic rhinitis Neg Hx    Asthma Neg Hx    Angioedema Neg Hx    Eczema Neg Hx    Urticaria Neg Hx    Rectal cancer Neg Hx    Esophageal cancer Neg Hx     SOCIAL HISTORY: Social History   Socioeconomic History   Marital status: Married    Spouse name: Shanon Brow   Number of children: Not on file   Years of education: Not on file   Highest education level: Not on file  Occupational History   Occupation: CRNA  Tobacco Use   Smoking status: Never    Passive exposure: Current   Smokeless tobacco: Never  Vaping Use   Vaping Use: Never used  Substance and Sexual Activity   Alcohol use: No   Drug use: No   Sexual activity: Not on file    Comment: ablation  Other Topics Concern   Not on file  Social History Narrative   Married since 1985.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   Social Determinants of Health   Financial Resource Strain: Not on file  Food Insecurity: Not on file  Transportation  Needs: Not on file  Physical Activity: Not on file  Stress: Not on file  Social Connections: Not on file  Intimate Partner Violence: Not on file    Allergies  Allergen Reactions   Doxycycline Nausea And Vomiting   Flulaval Quadrivalent [Influenza Vac Split Quad] Other (See Comments)    Weakness and don't feel good from the vaccine   Hydroxychloroquine Other (See Comments)    Nausea/malaise/reflux     Current Outpatient Medications  Medication Sig Dispense Refill   aspirin EC (ASPIRIN 81) 81 MG tablet Take 1 tablet by mouth once daily 30 tablet 0   celecoxib (CELEBREX) 50 MG capsule Take 2 capsules (100 mg total) by mouth 2 (two)  times daily as needed for Pain. 60 capsule 1   Ergocalciferol 50 MCG (2000 UT) CAPS daily.     esomeprazole (NEXIUM) 40 MG capsule TAKE 1 CAPSULE (40 MG TOTAL) BY MOUTH DAILY. (Patient taking differently: Take by mouth as needed.) 30 capsule 1   folic acid (FOLVITE) 1 MG tablet Take 1  tablet (1 mg total) by mouth daily. 30 tablet 1   folic acid (FOLVITE) 1 MG tablet Take 1 tablet (1 mg total) by mouth daily. 90 tablet 3   Magnesium Gluconate 550 MG TABS Take 550 mg by mouth. 2 to 3 times a week     methotrexate (RHEUMATREX) 2.5 MG tablet Take 4 tablets (10 mg total) by mouth once a week. Take exactly as directed by prescriber. 16 tablet 0   Multiple Vitamin (MULTI-VITAMIN) tablet Take 1 tablet by mouth daily.     Omega-3 1000 MG CAPS Take by mouth.     rosuvastatin (CRESTOR) 5 MG tablet Take1  tablet by mouth once daily 90 tablet 1   Turmeric 400 MG CAPS See admin instructions.     valACYclovir (VALTREX) 1000 MG tablet Take 1 tablet (1,000 mg total) by mouth 2 (two) times daily. 60 tablet 2   zolpidem (AMBIEN) 10 MG tablet Take 1 tablet (10 mg total) by mouth at bedtime. 30 tablet 3   No current facility-administered medications for this visit.    REVIEW OF SYSTEMS:  [X]  denotes positive finding, [ ]  denotes negative finding Cardiac  Comments:  Chest pain or chest pressure:    Shortness of breath upon exertion:    Short of breath when lying flat:    Irregular heart rhythm:        Vascular    Pain in calf, thigh, or hip brought on by ambulation:    Pain in feet at night that wakes you up from your sleep:     Blood clot in your veins:    Leg swelling:         Pulmonary    Oxygen at home:    Productive cough:     Wheezing:         Neurologic    Sudden weakness in arms or legs:     Sudden numbness in arms or legs:     Sudden onset of difficulty speaking or slurred speech:    Temporary loss of vision in one eye:     Problems with dizziness:         Gastrointestinal    Blood in stool:     Vomited blood:         Genitourinary    Burning when urinating:     Blood in urine:        Psychiatric    Major depression:  Hematologic    Bleeding problems:    Problems with blood clotting too easily:        Skin    Rashes or ulcers:         Constitutional    Fever or chills:      PHYSICAL EXAM: Vitals:   10/22/22 1341  BP: 110/73  Pulse: 75  Temp: 98.1 F (36.7 C)  SpO2: 96%  Weight: 176 lb (79.8 kg)  Height: 5\' 4"  (1.626 m)    GENERAL: The patient is a well-nourished female, in no acute distress. The vital signs are documented above. CARDIOVASCULAR: Carotid arteries without bruits bilaterally.  2+ radial pulses bilaterally. PULMONARY: There is good air exchange  MUSCULOSKELETAL: There are no major deformities or cyanosis. NEUROLOGIC: No focal weakness or paresthesias are detected. SKIN: There are no ulcers or rashes noted. PSYCHIATRIC: The patient has a normal affect.  DATA:  Reviewed her CT images with the patient from 09/25/2022.  This reveals mixed plaque in her carotid bifurcation on the right.  There is a mild amount of calcification.  This does not appear to be flow-limiting.  Her left carotid bifurcation is normal.  Carotid duplex in our office today reveals no evidence of flow-limiting stenoses bilaterally  MEDICAL ISSUES: I long discussion patient.  This is somewhat unusual to have this degree of mixed eccentric plaque in her right common carotid with no risk factors.  I do not feel that this puts her at any increased risk for stroke or other neurologic event.  I did discuss expected right brain symptoms and the need to report immediately should any of these occur.  I would recommend that she continue her daily aspirin therapy.  I would recommend duplex in 1 year to rule out any change in her plaque morphology or her degree of narrowing.   Rosetta Posner, MD FACS [facility] [phone] Pager [phone]  Note: Portions of this report may have been transcribed using voice recognition software.  Every effort has been made to ensure accuracy; however, inadvertent computerized transcription errors may still be present.

## 2022-10-25 ENCOUNTER — Ambulatory Visit
Admission: RE | Admit: 2022-10-25 | Discharge: 2022-10-25 | Disposition: A | Discharge status: Home / Self Care | Payer: Commercial Managed Care - PPO | Source: Ambulatory Visit | Attending: Family Medicine | Admitting: Family Medicine

## 2022-10-25 DIAGNOSIS — M50223 Other cervical disc displacement at C6-C7 level: Secondary | ICD-10-CM | POA: Diagnosis not present

## 2022-10-25 DIAGNOSIS — M542 Cervicalgia: Secondary | ICD-10-CM

## 2022-10-25 DIAGNOSIS — M4802 Spinal stenosis, cervical region: Secondary | ICD-10-CM | POA: Diagnosis not present

## 2022-11-03 ENCOUNTER — Other Ambulatory Visit (HOSPITAL_COMMUNITY): Payer: Self-pay

## 2022-11-03 ENCOUNTER — Other Ambulatory Visit: Payer: Self-pay

## 2022-11-13 DIAGNOSIS — M357 Hypermobility syndrome: Secondary | ICD-10-CM | POA: Diagnosis not present

## 2022-11-13 DIAGNOSIS — M79604 Pain in right leg: Secondary | ICD-10-CM | POA: Diagnosis not present

## 2022-11-13 DIAGNOSIS — M542 Cervicalgia: Secondary | ICD-10-CM | POA: Diagnosis not present

## 2022-11-13 DIAGNOSIS — R6884 Jaw pain: Secondary | ICD-10-CM | POA: Diagnosis not present

## 2022-11-13 DIAGNOSIS — R519 Headache, unspecified: Secondary | ICD-10-CM | POA: Diagnosis not present

## 2022-11-13 DIAGNOSIS — M546 Pain in thoracic spine: Secondary | ICD-10-CM | POA: Diagnosis not present

## 2022-11-13 DIAGNOSIS — M5459 Other low back pain: Secondary | ICD-10-CM | POA: Diagnosis not present

## 2022-11-13 DIAGNOSIS — M6281 Muscle weakness (generalized): Secondary | ICD-10-CM | POA: Diagnosis not present

## 2022-11-18 DIAGNOSIS — M5459 Other low back pain: Secondary | ICD-10-CM | POA: Diagnosis not present

## 2022-11-18 DIAGNOSIS — M6281 Muscle weakness (generalized): Secondary | ICD-10-CM | POA: Diagnosis not present

## 2022-11-18 DIAGNOSIS — R519 Headache, unspecified: Secondary | ICD-10-CM | POA: Diagnosis not present

## 2022-11-18 DIAGNOSIS — M542 Cervicalgia: Secondary | ICD-10-CM | POA: Diagnosis not present

## 2022-11-18 DIAGNOSIS — R6884 Jaw pain: Secondary | ICD-10-CM | POA: Diagnosis not present

## 2022-11-18 DIAGNOSIS — M546 Pain in thoracic spine: Secondary | ICD-10-CM | POA: Diagnosis not present

## 2022-11-18 DIAGNOSIS — M357 Hypermobility syndrome: Secondary | ICD-10-CM | POA: Diagnosis not present

## 2022-11-18 DIAGNOSIS — M79604 Pain in right leg: Secondary | ICD-10-CM | POA: Diagnosis not present

## 2022-11-20 ENCOUNTER — Other Ambulatory Visit (HOSPITAL_COMMUNITY): Payer: Self-pay

## 2022-11-20 ENCOUNTER — Other Ambulatory Visit: Payer: Self-pay

## 2022-11-20 MED ORDER — VALACYCLOVIR HCL 1 G PO TABS
1000.0000 mg | ORAL_TABLET | Freq: Two times a day (BID) | ORAL | 2 refills | Status: DC
  Filled 2022-11-20: qty 60, 30d supply, fill #0
  Filled 2022-12-17: qty 60, 30d supply, fill #1
  Filled 2023-02-16: qty 60, 30d supply, fill #2

## 2022-11-26 DIAGNOSIS — M546 Pain in thoracic spine: Secondary | ICD-10-CM | POA: Diagnosis not present

## 2022-11-26 DIAGNOSIS — R519 Headache, unspecified: Secondary | ICD-10-CM | POA: Diagnosis not present

## 2022-11-26 DIAGNOSIS — M6281 Muscle weakness (generalized): Secondary | ICD-10-CM | POA: Diagnosis not present

## 2022-11-26 DIAGNOSIS — R6884 Jaw pain: Secondary | ICD-10-CM | POA: Diagnosis not present

## 2022-11-26 DIAGNOSIS — M542 Cervicalgia: Secondary | ICD-10-CM | POA: Diagnosis not present

## 2022-11-26 DIAGNOSIS — M5459 Other low back pain: Secondary | ICD-10-CM | POA: Diagnosis not present

## 2022-11-26 DIAGNOSIS — M357 Hypermobility syndrome: Secondary | ICD-10-CM | POA: Diagnosis not present

## 2022-11-26 DIAGNOSIS — M79604 Pain in right leg: Secondary | ICD-10-CM | POA: Diagnosis not present

## 2022-12-02 ENCOUNTER — Other Ambulatory Visit (HOSPITAL_COMMUNITY): Payer: Self-pay

## 2022-12-02 DIAGNOSIS — M6281 Muscle weakness (generalized): Secondary | ICD-10-CM | POA: Diagnosis not present

## 2022-12-02 DIAGNOSIS — E063 Autoimmune thyroiditis: Secondary | ICD-10-CM | POA: Diagnosis not present

## 2022-12-02 DIAGNOSIS — M546 Pain in thoracic spine: Secondary | ICD-10-CM | POA: Diagnosis not present

## 2022-12-02 DIAGNOSIS — B009 Herpesviral infection, unspecified: Secondary | ICD-10-CM | POA: Diagnosis not present

## 2022-12-02 DIAGNOSIS — M5459 Other low back pain: Secondary | ICD-10-CM | POA: Diagnosis not present

## 2022-12-02 DIAGNOSIS — R6884 Jaw pain: Secondary | ICD-10-CM | POA: Diagnosis not present

## 2022-12-02 DIAGNOSIS — M79604 Pain in right leg: Secondary | ICD-10-CM | POA: Diagnosis not present

## 2022-12-02 DIAGNOSIS — Z79899 Other long term (current) drug therapy: Secondary | ICD-10-CM | POA: Diagnosis not present

## 2022-12-02 DIAGNOSIS — M542 Cervicalgia: Secondary | ICD-10-CM | POA: Diagnosis not present

## 2022-12-02 DIAGNOSIS — R519 Headache, unspecified: Secondary | ICD-10-CM | POA: Diagnosis not present

## 2022-12-02 DIAGNOSIS — H8111 Benign paroxysmal vertigo, right ear: Secondary | ICD-10-CM | POA: Diagnosis not present

## 2022-12-02 DIAGNOSIS — M357 Hypermobility syndrome: Secondary | ICD-10-CM | POA: Diagnosis not present

## 2022-12-02 MED ORDER — VALACYCLOVIR HCL 1 G PO TABS
1000.0000 mg | ORAL_TABLET | Freq: Two times a day (BID) | ORAL | 2 refills | Status: DC
  Filled 2022-12-30: qty 120, 60d supply, fill #0
  Filled 2023-03-31: qty 60, 30d supply, fill #0
  Filled 2023-05-06: qty 60, 30d supply, fill #1

## 2022-12-08 ENCOUNTER — Other Ambulatory Visit: Payer: Self-pay

## 2022-12-08 ENCOUNTER — Other Ambulatory Visit: Payer: Self-pay | Admitting: Obstetrics and Gynecology

## 2022-12-08 ENCOUNTER — Other Ambulatory Visit (HOSPITAL_COMMUNITY): Payer: Self-pay

## 2022-12-08 DIAGNOSIS — Z1231 Encounter for screening mammogram for malignant neoplasm of breast: Secondary | ICD-10-CM

## 2022-12-08 MED ORDER — METHOTREXATE SODIUM 2.5 MG PO TABS
10.0000 mg | ORAL_TABLET | ORAL | 2 refills | Status: DC
  Filled 2022-12-08: qty 16, 28d supply, fill #0
  Filled 2022-12-30: qty 16, 28d supply, fill #1

## 2022-12-10 DIAGNOSIS — R519 Headache, unspecified: Secondary | ICD-10-CM | POA: Diagnosis not present

## 2022-12-10 DIAGNOSIS — M79604 Pain in right leg: Secondary | ICD-10-CM | POA: Diagnosis not present

## 2022-12-10 DIAGNOSIS — M5459 Other low back pain: Secondary | ICD-10-CM | POA: Diagnosis not present

## 2022-12-10 DIAGNOSIS — M546 Pain in thoracic spine: Secondary | ICD-10-CM | POA: Diagnosis not present

## 2022-12-10 DIAGNOSIS — M542 Cervicalgia: Secondary | ICD-10-CM | POA: Diagnosis not present

## 2022-12-10 DIAGNOSIS — M357 Hypermobility syndrome: Secondary | ICD-10-CM | POA: Diagnosis not present

## 2022-12-10 DIAGNOSIS — M6281 Muscle weakness (generalized): Secondary | ICD-10-CM | POA: Diagnosis not present

## 2022-12-10 DIAGNOSIS — R6884 Jaw pain: Secondary | ICD-10-CM | POA: Diagnosis not present

## 2022-12-18 ENCOUNTER — Other Ambulatory Visit (HOSPITAL_COMMUNITY): Payer: Self-pay

## 2022-12-18 ENCOUNTER — Other Ambulatory Visit: Payer: Self-pay

## 2022-12-23 DIAGNOSIS — R519 Headache, unspecified: Secondary | ICD-10-CM | POA: Diagnosis not present

## 2022-12-23 DIAGNOSIS — M5459 Other low back pain: Secondary | ICD-10-CM | POA: Diagnosis not present

## 2022-12-23 DIAGNOSIS — M542 Cervicalgia: Secondary | ICD-10-CM | POA: Diagnosis not present

## 2022-12-23 DIAGNOSIS — M357 Hypermobility syndrome: Secondary | ICD-10-CM | POA: Diagnosis not present

## 2022-12-23 DIAGNOSIS — R6884 Jaw pain: Secondary | ICD-10-CM | POA: Diagnosis not present

## 2022-12-23 DIAGNOSIS — M6281 Muscle weakness (generalized): Secondary | ICD-10-CM | POA: Diagnosis not present

## 2022-12-23 DIAGNOSIS — M79604 Pain in right leg: Secondary | ICD-10-CM | POA: Diagnosis not present

## 2022-12-23 DIAGNOSIS — M546 Pain in thoracic spine: Secondary | ICD-10-CM | POA: Diagnosis not present

## 2022-12-30 ENCOUNTER — Other Ambulatory Visit: Payer: Self-pay

## 2022-12-30 ENCOUNTER — Other Ambulatory Visit (HOSPITAL_COMMUNITY): Payer: Self-pay

## 2023-01-07 ENCOUNTER — Ambulatory Visit: Payer: Commercial Managed Care - PPO

## 2023-01-20 ENCOUNTER — Other Ambulatory Visit (HOSPITAL_COMMUNITY): Payer: Self-pay

## 2023-01-21 ENCOUNTER — Other Ambulatory Visit (HOSPITAL_COMMUNITY): Payer: Self-pay

## 2023-01-21 MED ORDER — METHOCARBAMOL 500 MG PO TABS
750.0000 mg | ORAL_TABLET | Freq: Four times a day (QID) | ORAL | 0 refills | Status: DC | PRN
  Filled 2023-01-21 (×2): qty 60, 10d supply, fill #0

## 2023-02-10 ENCOUNTER — Ambulatory Visit: Payer: Commercial Managed Care - PPO

## 2023-02-16 ENCOUNTER — Other Ambulatory Visit (HOSPITAL_COMMUNITY): Payer: Self-pay

## 2023-02-18 ENCOUNTER — Ambulatory Visit (INDEPENDENT_AMBULATORY_CARE_PROVIDER_SITE_OTHER): Payer: Managed Care, Other (non HMO) | Admitting: Ophthalmology

## 2023-02-18 ENCOUNTER — Encounter (INDEPENDENT_AMBULATORY_CARE_PROVIDER_SITE_OTHER): Payer: Self-pay | Admitting: Ophthalmology

## 2023-02-18 DIAGNOSIS — Z8619 Personal history of other infectious and parasitic diseases: Secondary | ICD-10-CM

## 2023-02-18 DIAGNOSIS — M0609 Rheumatoid arthritis without rheumatoid factor, multiple sites: Secondary | ICD-10-CM

## 2023-02-18 DIAGNOSIS — H269 Unspecified cataract: Secondary | ICD-10-CM

## 2023-02-18 DIAGNOSIS — H43823 Vitreomacular adhesion, bilateral: Secondary | ICD-10-CM | POA: Diagnosis not present

## 2023-02-18 DIAGNOSIS — Z79899 Other long term (current) drug therapy: Secondary | ICD-10-CM

## 2023-02-18 DIAGNOSIS — H25813 Combined forms of age-related cataract, bilateral: Secondary | ICD-10-CM | POA: Diagnosis not present

## 2023-02-18 DIAGNOSIS — H31003 Unspecified chorioretinal scars, bilateral: Secondary | ICD-10-CM

## 2023-02-18 NOTE — Progress Notes (Signed)
[facility] - Clinic Note  02/18/2023     CHIEF COMPLAINT Patient presents for Retina Follow Up   HISTORY OF PRESENT ILLNESS: Robin Maynard is a 57 y.o. female who presents to the clinic today for:   HPI     Retina Follow Up   Patient presents with  Other.  In both eyes.  Severity is moderate.  Duration of 6 months.  Since onset it is stable.  I, the attending physician,  performed the HPI with the patient and updated documentation appropriately.        Comments   6 momth Ret eval for Chorioretinal scar ou. Patient states vision seems the same      Last edited by Rennis Chris, MD on 02/21/2023  8:31 PM.     Pt states she still sees Dr. Jimmey Ralph, she states she still sees "haziness" with her right eye, she is still getting herpetic outbreaks even though she is taking 1000mg  BID valacyclovir, she states she is starting to feel numbness and tingling on the right side of her face during the outbreaks and that is when she notices the hazy vision, she does not have lesions on her skin, she is seeing infectious disease bc they are thinking it might not be herpetic, she is also seeing neurology   Referring physician: Aliene Beams, MD [phone] [place],  [place] [address]  HISTORICAL INFORMATION:   Selected notes from the MEDICAL RECORD NUMBER Referred by Dr. Jimmey Ralph LEE:  Ocular Hx- PMH-    CURRENT MEDICATIONS: No current outpatient medications on file. (Ophthalmic Drugs)   No current facility-administered medications for this visit. (Ophthalmic Drugs)   Current Outpatient Medications (Other)  Medication Sig   aspirin EC (ASPIRIN 81) 81 MG tablet Take 1 tablet by mouth once daily   celecoxib (CELEBREX) 50 MG capsule Take 2 capsules (100 mg total) by mouth 2 (two)  times daily as needed for Pain.   Ergocalciferol 50 MCG (2000 UT) CAPS daily.   folic acid (FOLVITE) 1 MG tablet Take 1 tablet (1 mg total) by mouth daily.   Magnesium  Gluconate 550 MG TABS Take 550 mg by mouth. 2 to 3 times a week   methocarbamol (ROBAXIN) 500 MG tablet Take 1.5 tablets (750 mg total) by mouth every 6 (six) hours as needed.   methotrexate (RHEUMATREX) 2.5 MG tablet Take 4 tablets (10 mg total) by mouth once a week. Take exactly as directed by prescriber.   Multiple Vitamin (MULTI-VITAMIN) tablet Take 1 tablet by mouth daily.   Omega-3 1000 MG CAPS Take by mouth.   rosuvastatin (CRESTOR) 5 MG tablet Take1  tablet by mouth once daily   Turmeric 400 MG CAPS See admin instructions.   valACYclovir (VALTREX) 1000 MG tablet Take 1 tablet (1,000 mg total) by mouth 2 (two) times daily.   zolpidem (AMBIEN) 10 MG tablet Take 1 tablet (10 mg total) by mouth at bedtime.   esomeprazole (NEXIUM) 40 MG capsule TAKE 1 CAPSULE (40 MG TOTAL) BY MOUTH DAILY. (Patient taking differently: Take by mouth as needed.)   folic acid (FOLVITE) 1 MG tablet Take 1 tablet (1 mg total) by mouth daily.   valACYclovir (VALTREX) 1000 MG tablet Take 1 tablet (1,000 mg total) by mouth 2 (two) times daily.   No current facility-administered medications for this visit. (Other)   REVIEW OF SYSTEMS: ROS   Positive for: Musculoskeletal, Endocrine, Eyes Negative for: Constitutional, Gastrointestinal, Neurological, Skin, Genitourinary, HENT, Cardiovascular,  Respiratory, Psychiatric, Allergic/Imm, Heme/Lymph Last edited by Lana Fish, COT on 02/18/2023  1:08 PM.      ALLERGIES Allergies  Allergen Reactions   Doxycycline Nausea And Vomiting   Flulaval Quadrivalent [Influenza Vac Split Quad] Other (See Comments)    Weakness and don't feel good from the vaccine   Hydroxychloroquine Other (See Comments)    Nausea/malaise/reflux    PAST MEDICAL HISTORY Past Medical History:  Diagnosis Date   Acute pancreatitis    Autoimmune thyroiditis    Constipation    Diverticulitis    Gastritis    GERD (gastroesophageal reflux disease)    GI work up in progress   Hashimoto's  thyroiditis    Headache    Heartburn    Herpes    Joint pain    Mass on back    Obesity    Pancreatic disease    PONV (postoperative nausea and vomiting)    Rheumatoid arthritis (HCC)    SOBOE (shortness of breath on exertion)    Thyroid condition    Vitamin D deficiency    Past Surgical History:  Procedure Laterality Date   COLONOSCOPY     DILITATION & CURRETTAGE/HYSTROSCOPY WITH HYDROTHERMAL ABLATION N/A 12/26/2016   Procedure: DILATATION & CURETTAGE/HYSTEROSCOPY WITH HYDROTHERMAL ABLATION;  Surgeon: Marcelle Overlie, MD;  Location: [facility] [place];  Service: Gynecology;  Laterality: N/A;   MASS EXCISION Right 02/23/2018   Procedure: EXCISION BACK MASS ERAS PATHWAY;  Surgeon: Emelia Loron, MD;  Location: [facility];  Service: General;  Laterality: Right;   POLYPECTOMY     TONSILLECTOMY AND ADENOIDECTOMY     TYMPANOSTOMY TUBE PLACEMENT     FAMILY HISTORY Family History  Problem Relation Age of Onset   Deep vein thrombosis Mother    Colon polyps Mother    Heart disease Mother    Depression Mother    Anxiety disorder Mother    Schizophrenia Mother    Obesity Mother    Cancer Father    Hyperlipidemia Father    Gastric cancer Father    Diabetes Father    Heart disease Father    Hypertension Father    Stomach cancer Father    Diabetes Paternal Grandmother    Heart disease Paternal Grandmother    Aortic stenosis Son    Breast cancer Maternal Aunt    Colon cancer Neg Hx    Allergic rhinitis Neg Hx    Asthma Neg Hx    Angioedema Neg Hx    Eczema Neg Hx    Urticaria Neg Hx    Rectal cancer Neg Hx    Esophageal cancer Neg Hx    SOCIAL HISTORY Social History   Tobacco Use   Smoking status: Never    Passive exposure: Current   Smokeless tobacco: Never  Vaping Use   Vaping status: Never Used  Substance Use Topics   Alcohol use: No   Drug use: No       OPHTHALMIC EXAM:  Base Eye Exam     Visual Acuity (Snellen - Linear)        Right Left   Dist cc 20/40 20/20   Dist ph cc 20/20-2     Correction: Glasses         Tonometry (Tonopen, 1:18 PM)       Right Left   Pressure 18 16         Pupils       Dark Light Shape React APD   Right 3 2 Round  Brisk None   Left 3 2 Round Brisk None         Visual Fields       Left Right    Full Full         Extraocular Movement       Right Left    Full, Ortho Full, Ortho         Neuro/Psych     Oriented x3: Yes   Mood/Affect: Normal         Dilation     Both eyes: 1.0% Mydriacyl, 2.5% Phenylephrine @ 1:18 PM           Slit Lamp and Fundus Exam     External Exam       Right Left   External Normal Normal         Slit Lamp Exam       Right Left   Lids/Lashes Normal Meibomian gland dysfunction   Conjunctiva/Sclera nasal pingeucula White and quiet   Cornea 1-2 +PEE, mild tear film debris 1-2 +PEE, mild tear film debris   Anterior Chamber deep and clear Deep and quiet   Iris Round and dilated Round and dialted   Lens 2+ Nuclear sclerosis, 2+ Cortical cataract 2+ Nuclear sclerosis, 2+ Cortical cataract   Anterior Vitreous Vitreous syneresis, no cell, Vitreous condensations Vitreous syneresis, Vitreous condensations         Fundus Exam       Right Left   Disc Pink and sharp, +PPA Pink and sharp, mild temp PPA   C/D Ratio 0.2 0.2   Macula Flat, Good foveal reflex, No heme or edema Flat, Good foveal reflex, mild RPE mottling   Vessels Tortuous attenuated, Tortuous   Periphery attached; Focal CR atrophy 1030 -- very faint; no heme attached; faint CR scar vs tuft at 0300 -- stable, chorioretinal scar at 1030 -- no hole, no heme           Refraction     Wearing Rx       Sphere Cylinder Axis Add   Right -4.25 +1.50 105 2..00   Left -4.25 +0.75 055 +2.00         Manifest Refraction (Auto)       Sphere Cylinder Axis Dist VA   Right -4.25 +0.75 111 20/25-2   Left               IMAGING AND PROCEDURES   Imaging and Procedures for 02/18/2023  OCT, Retina - OU - Both Eyes       Right Eye Quality was good. Central Foveal Thickness: 277. Progression has been stable. Findings include normal foveal contour, no IRF, no SRF, myopic contour, vitreomacular adhesion .   Left Eye Quality was good. Central Foveal Thickness: 277. Progression has been stable. Findings include normal foveal contour, no IRF, no SRF, myopic contour, vitreomacular adhesion .   Notes *Images captured and stored on drive  Diagnosis / Impression:  NFP; no IRF/SRF OU +VMA and +myopic contour OU  Clinical management:  See below  Abbreviations: NFP - Normal foveal profile. CME - cystoid macular edema. PED - pigment epithelial detachment. IRF - intraretinal fluid. SRF - subretinal fluid. EZ - ellipsoid zone. ERM - epiretinal membrane. ORA - outer retinal atrophy. ORT - outer retinal tubulation. SRHM - subretinal hyper-reflective material. IRHM - intraretinal hyper-reflective material             ASSESSMENT/PLAN:    ICD-10-CM   1. Chorioretinal scar of both eyes  H31.003  OCT, Retina - OU - Both Eyes    2. Vitreomacular adhesion of both eyes  H43.823     3. History of herpes simplex infection  Z86.19     4. Rheumatoid arthritis of multiple sites with negative rheumatoid factor (HCC)  M06.09     5. Combined forms of age-related cataract of both eyes  H25.813       1. Peripheral cystoid degneration / CR scarring and atrophy OU - stable -- no frank retinal tears, holes - FA 01.17.24: OD shows focal staining superotemporal periphery corresponding to CR scar; no leakage or vasculitis, OS shows focal staining temporal periphery; no leakage or vasculitis - discussed findings, prognosis, and treatment options including observation - no retinal or ophthalmic interventions indicated or recommended  - monitor - f/u 1 year, DFE, OCT  2.  Vitreomacular Adhesion, OU  - OCT shows VMA OU -- stable.  - monitor  3.  History of HSV -- multiple recurrences  - affected V2 dermatome  - currently on 1g TID po valtrex  - no active lesions  4. History of RA  - managed by Dr. [facility]  - currently on MTX 10 mg + folic acid  - no evidence of uveitis OU on exam, imaging or fluorescein angiography  - FA 01.17.24 -- no leakage or vasculitis OU  5. Combined forms aged related cataract OU - The symptoms of cataract, surgical options, and treatments and risks were discussed with patient. - discussed diagnosis and progression - monitor  Ophthalmic Meds Ordered this visit:  No orders of the defined types were placed in this encounter.    Return in about 1 year (around 02/18/2024) for f/u CR scarring and atrophy OU, DFE, OCT.  There are no Patient Instructions on file for this visit.   Explained the diagnoses, plan, and follow up with the patient and they expressed understanding.  Patient expressed understanding of the importance of proper follow up care.   This document serves as a record of services personally performed by Karie Chimera, MD, PhD. It was created on their behalf by De Blanch, an ophthalmic technician. The creation of this record is the provider's dictation and/or activities during the visit.    Electronically signed by: De Blanch, OA, 02/21/23  8:36 PM  This document serves as a record of services personally performed by Karie Chimera, MD, PhD. It was created on their behalf by Glee Arvin. Manson Passey, OA an ophthalmic technician. The creation of this record is the provider's dictation and/or activities during the visit.    Electronically signed by: Glee Arvin. Manson Passey, OA 02/21/23 8:36 PM   Karie Chimera, M.D., Ph.D. Diseases & Surgery of the Retina and Vitreous [facility]  I have reviewed the above documentation for accuracy and completeness, and I agree with the above. Karie Chimera, M.D., Ph.D. 02/21/23 8:40 PM  Abbreviations: M  myopia (nearsighted); A astigmatism; H hyperopia (farsighted); P presbyopia; Mrx spectacle prescription;  CTL contact lenses; OD right eye; OS left eye; OU both eyes  XT exotropia; ET esotropia; PEK punctate epithelial keratitis; PEE punctate epithelial erosions; DES dry eye syndrome; MGD meibomian gland dysfunction; ATs artificial tears; PFAT's preservative free artificial tears; NSC nuclear sclerotic cataract; PSC posterior subcapsular cataract; ERM epi-retinal membrane; PVD posterior vitreous detachment; RD retinal detachment; DM diabetes mellitus; DR diabetic retinopathy; NPDR non-proliferative diabetic retinopathy; PDR proliferative diabetic retinopathy; CSME clinically significant macular edema; DME diabetic macular edema; dbh dot blot hemorrhages;  CWS cotton wool spot; POAG primary open angle glaucoma; C/D cup-to-disc ratio; HVF humphrey visual field; GVF goldmann visual field; OCT optical coherence tomography; IOP intraocular pressure; BRVO Branch retinal vein occlusion; CRVO central retinal vein occlusion; CRAO central retinal artery occlusion; BRAO branch retinal artery occlusion; RT retinal tear; SB scleral buckle; PPV pars plana vitrectomy; VH Vitreous hemorrhage; PRP panretinal laser photocoagulation; IVK intravitreal kenalog; VMT vitreomacular traction; MH Macular hole;  NVD neovascularization of the disc; NVE neovascularization elsewhere; AREDS age related eye disease study; ARMD age related macular degeneration; POAG primary open angle glaucoma; EBMD epithelial/anterior basement membrane dystrophy; ACIOL anterior chamber intraocular lens; IOL intraocular lens; PCIOL posterior chamber intraocular lens; Phaco/IOL phacoemulsification with intraocular lens placement; PRK photorefractive keratectomy; LASIK laser assisted in situ keratomileusis; HTN hypertension; DM diabetes mellitus; COPD chronic obstructive pulmonary disease

## 2023-02-24 ENCOUNTER — Ambulatory Visit
Admission: RE | Admit: 2023-02-24 | Discharge: 2023-02-24 | Disposition: A | Discharge status: Home / Self Care | Payer: Managed Care, Other (non HMO) | Source: Ambulatory Visit | Attending: Obstetrics and Gynecology | Admitting: Obstetrics and Gynecology

## 2023-02-24 DIAGNOSIS — Z1231 Encounter for screening mammogram for malignant neoplasm of breast: Secondary | ICD-10-CM

## 2023-03-08 ENCOUNTER — Other Ambulatory Visit (HOSPITAL_COMMUNITY): Payer: Self-pay

## 2023-03-09 ENCOUNTER — Other Ambulatory Visit: Payer: Self-pay

## 2023-03-09 ENCOUNTER — Other Ambulatory Visit (HOSPITAL_COMMUNITY): Payer: Self-pay

## 2023-03-09 MED ORDER — PREDNISONE 5 MG PO TABS
ORAL_TABLET | ORAL | 0 refills | Status: AC
  Filled 2023-03-09: qty 26, 12d supply, fill #0

## 2023-03-10 ENCOUNTER — Other Ambulatory Visit (HOSPITAL_COMMUNITY): Payer: Self-pay

## 2023-03-10 MED ORDER — PAXLOVID (300/100) 20 X 150 MG & 10 X 100MG PO TBPK
3.0000 | ORAL_TABLET | Freq: Two times a day (BID) | ORAL | 0 refills | Status: DC
  Filled 2023-03-10: qty 30, 5d supply, fill #0

## 2023-03-10 MED ORDER — LORATADINE 10 MG PO TABS
10.0000 mg | ORAL_TABLET | Freq: Every day | ORAL | 0 refills | Status: DC
  Filled 2023-03-10: qty 30, 30d supply, fill #0

## 2023-03-31 ENCOUNTER — Other Ambulatory Visit (HOSPITAL_COMMUNITY): Payer: Self-pay

## 2023-03-31 ENCOUNTER — Other Ambulatory Visit: Payer: Self-pay

## 2023-04-21 ENCOUNTER — Encounter: Payer: Self-pay | Admitting: Gastroenterology

## 2023-05-06 ENCOUNTER — Other Ambulatory Visit: Payer: Self-pay

## 2023-05-06 ENCOUNTER — Other Ambulatory Visit (HOSPITAL_COMMUNITY): Payer: Self-pay

## 2023-05-06 MED ORDER — ROSUVASTATIN CALCIUM 5 MG PO TABS
5.0000 mg | ORAL_TABLET | Freq: Every day | ORAL | 0 refills | Status: DC
  Filled 2023-05-06: qty 30, 30d supply, fill #0
  Filled 2023-06-23: qty 30, 30d supply, fill #1
  Filled 2023-07-16 – 2023-07-23 (×2): qty 30, 30d supply, fill #2

## 2023-05-08 ENCOUNTER — Other Ambulatory Visit (HOSPITAL_COMMUNITY): Payer: Self-pay

## 2023-05-08 ENCOUNTER — Other Ambulatory Visit: Payer: Self-pay

## 2023-05-08 MED ORDER — CELECOXIB 50 MG PO CAPS
100.0000 mg | ORAL_CAPSULE | Freq: Two times a day (BID) | ORAL | 1 refills | Status: DC | PRN
  Filled 2023-05-08: qty 60, 15d supply, fill #0

## 2023-06-09 ENCOUNTER — Other Ambulatory Visit (HOSPITAL_COMMUNITY): Payer: Self-pay

## 2023-06-09 MED ORDER — VALACYCLOVIR HCL 1 G PO TABS
1000.0000 mg | ORAL_TABLET | Freq: Two times a day (BID) | ORAL | 3 refills | Status: DC
  Filled 2023-06-09: qty 60, 30d supply, fill #0
  Filled 2023-07-03 – 2023-07-23 (×2): qty 60, 30d supply, fill #1

## 2023-06-22 ENCOUNTER — Other Ambulatory Visit (HOSPITAL_COMMUNITY): Payer: Self-pay

## 2023-06-22 MED ORDER — AMOXICILLIN 500 MG PO CAPS
500.0000 mg | ORAL_CAPSULE | Freq: Three times a day (TID) | ORAL | 0 refills | Status: DC
  Filled 2023-06-22 (×2): qty 30, 10d supply, fill #0

## 2023-06-23 ENCOUNTER — Other Ambulatory Visit: Payer: Self-pay

## 2023-06-23 ENCOUNTER — Other Ambulatory Visit (HOSPITAL_COMMUNITY): Payer: Self-pay

## 2023-06-24 ENCOUNTER — Ambulatory Visit (INDEPENDENT_AMBULATORY_CARE_PROVIDER_SITE_OTHER): Payer: Managed Care, Other (non HMO) | Admitting: Neurology

## 2023-06-24 ENCOUNTER — Encounter: Payer: Self-pay | Admitting: Neurology

## 2023-06-24 VITALS — BP 113/75 | HR 86 | Ht 64.0 in | Wt 185.0 lb

## 2023-06-24 DIAGNOSIS — G20C Parkinsonism, unspecified: Secondary | ICD-10-CM

## 2023-06-24 DIAGNOSIS — E519 Thiamine deficiency, unspecified: Secondary | ICD-10-CM | POA: Diagnosis not present

## 2023-06-24 DIAGNOSIS — H93A9 Pulsatile tinnitus, unspecified ear: Secondary | ICD-10-CM

## 2023-06-24 DIAGNOSIS — E531 Pyridoxine deficiency: Secondary | ICD-10-CM | POA: Diagnosis not present

## 2023-06-24 DIAGNOSIS — S0460XA Injury of acoustic nerve, unspecified side, initial encounter: Secondary | ICD-10-CM

## 2023-06-24 DIAGNOSIS — R296 Repeated falls: Secondary | ICD-10-CM | POA: Diagnosis not present

## 2023-06-24 DIAGNOSIS — R430 Anosmia: Secondary | ICD-10-CM

## 2023-06-24 DIAGNOSIS — R42 Dizziness and giddiness: Secondary | ICD-10-CM

## 2023-06-24 DIAGNOSIS — R2689 Other abnormalities of gait and mobility: Secondary | ICD-10-CM

## 2023-06-24 DIAGNOSIS — K1379 Other lesions of oral mucosa: Secondary | ICD-10-CM

## 2023-06-24 DIAGNOSIS — E538 Deficiency of other specified B group vitamins: Secondary | ICD-10-CM | POA: Diagnosis not present

## 2023-06-24 DIAGNOSIS — H814 Vertigo of central origin: Secondary | ICD-10-CM

## 2023-06-24 DIAGNOSIS — H9190 Unspecified hearing loss, unspecified ear: Secondary | ICD-10-CM

## 2023-06-24 DIAGNOSIS — E530 Riboflavin deficiency: Secondary | ICD-10-CM

## 2023-06-24 MED ORDER — AJOVY 225 MG/1.5ML ~~LOC~~ SOAJ: 225.0000 mg | SUBCUTANEOUS | Status: DC

## 2023-06-24 NOTE — Patient Instructions (Addendum)
Vestibular migraines? Treat for migraines and see if improves - Ajovy x 3 months MRi brain w/wo contrast IAC protocol (thin cuts between the brain stem and ear to see the 8th cranial nerve closer) CT Temporal bones wo CM for semicircular canal dehiscence   Paroxysmal ataxia?: Paroxysmal ataxia is a rare movement disorder typically characterized by early-onset attacks of cerebellar ataxia that can last from a few seconds to several days. Duration of the attacks and triggering factors mostly depend on the underlying cause. Test later.   Heart condition? 30-day cardiac monitor  DAT Scan for Parkinsonism  Behcet's Disease?  - [facility]: HLA B51 Behcet's Disease association test? - Ask Almond Lint to see if she can biopsy when patient gets a lesion  A biopsy can provide a definitive diagnosis of oral lesions associated with Behet's disease, a systemic disease that can cause mouth ulcers:  What it shows A biopsy can show neutrophil infiltration in the lesions, which is a characteristic of Behet's disease. It can also help rule out other conditions that cause similar mucosal ulcerations.  When it's performed A biopsy is not routinely indicated, but it can be performed to further investigate oral lesions.  Other tests that can help diagnose Behet's disease include: Blood tests: Can rule out other conditions  Pathergy test: A sterile needle is inserted into the skin, and a small red bump forms at the injection site one to two days later if the test is positive. However, only a minority of Behcet's patients have a positive pathergy test.  Behet's disease is diagnosed based on a combination of symptoms and repeated outbreaks. Symptoms don't have to occur together, and other causes must be ruled out first.  Good oral hygiene is important for people with Behet's disease, even when their mouth or gums are painful.  Oral Lesions as the Primary Manifestations of Behet's Disease Feb 03, 2022 -- 2.2.  Additional Diagnostic Tests; Histopathological Assessment and HLA Genetic Profiling. Despite the diagnosis of BD b... [employer] [facility] Immunohistology of oral lesions from patients with recurrent oral ulcers and Behet's syndrome  PubMed Behet's Disease and Mouth Ulcers - Behcet's [place] Investigations. Investigations into mouth ulcers such as biopsy and swabs to check for infection may be undertaken but are not rou...  Behcet's [place] Show all   Behet's disease is a rare, chronic inflammatory disorder that affects the blood vessels in many parts of the body:  Symptoms The most common symptom is painful sores in the mouth, but it can also affect the genitals. Other symptoms include inflamed eyes, arthritis, rashes, fever, headache, stiff neck, and difficulty coordinating movement.  Causes Behet's disease is triggered by a combination of genetic and environmental factors.  Treatment There's no cure, but treatments like steroids, mouthwash, and eyedrops can help manage symptoms.  Complications Behet's disease can cause serious problems, including: Kidney problems: Small vessel involvement can cause renal vasculitis, while large vessel involvement can cause aneurysms and thrombosis.  Brain involvement: Inflammation in the brain can cause fever, headache, stiff neck, difficulty coordinating movement, or even a stroke.  Gastrointestinal issues: Lesions in the gastrointestinal tract can cause abdominal pain or blood in the stool. These lesions can be dangerous because they can cause the intestine to bleed or rupture.  Behet's disease is also known as Behcet syndrome and malignant aphthosis

## 2023-06-24 NOTE — Progress Notes (Signed)
[facility]    Provider:  Dr Lucia Gaskins Requesting Provider: Barnett Abu, MD Primary Care Provider:  Aliene Beams, MD  CC:  dizzines and imbalance with lesions in the mouth  HPI:  Robin Maynard is a 57 y.o. female here as requested by Barnett Abu, MD for imbalance. No dizziness. has Mass of right lower leg; Mass on back; Acute pancreatitis; Constipation; Overweight (BMI 25.0-29.9); Abdominal pain, epigastric; Closed fracture of right clavicle; Other hyperlipidemia; Vitamin D deficiency; Pre-diabetes; Rheumatoid arthritis (HCC); Hypercholesterolemia; Prediabetes; Hashimoto's thyroiditis; and H/o Low serum cortisol level on their problem list.  She lost her sense of smell years ago pre-covid. She cannot smell things, sometimes can catch a whiff. She woke up 3-4 years ago grabbing the walls for balance. Was not room spinning or dizziness. She hs a constant pain on the right temple, dull ache ache, pulsating, She has a hx of migraines. She does not have room spinning or an internal feeling of moevin but she has such bad balance she had to hold the walls, no lightheadedness, no CP, no pre-syncope, she went to ENT and she had a hearing test and a CT test found 50% carotid stenosis she followed up with vascular and is following. She sees Rhaumatologist and she has been the driving force for getting answers, rhaumatology may thik she has an inflammatory joint disease but they have never swabbed the lesions she gets. She is positive for HSV1. She has breakthroughs of lesions in the mouth, never enough to culture anything, valtrex has helped still get sores but not open sores more like little rashes in her mouth and won;t even scab but used to scab, valtrex 1-2x a day every day. She has balance issues a lot, worse with the lesions, she is a Engineer, civil (consulting). The pain on the right side of the head is worse with imbalance. Imbalance is daily and not just with the imbalance. Imbalance does not get  better with the valtrex of decreased lesions. Gets the lesions with sun exposure and stress, symptoms better in the winter. The outbreak was on the face and she had a crazy pain radiating from the left nose to the temple. Her migraines are on the right, light sensitivity, nausea, pulsating/pounding/throbbing/unilateral on the right. Upper right quadrant vision loss.   Reviewed notes, labs and imaging from outside physicians, which showed:  02/17/2021: CLINICAL DATA:  Dizziness   EXAM: MRI HEAD WITHOUT CONTRAST   TECHNIQUE: Multiplanar, multiecho pulse sequences of the brain and surrounding structures were obtained without intravenous contrast.   COMPARISON:  CT head 02/17/2021   FINDINGS: Brain: No acute infarction, hemorrhage, hydrocephalus, extra-axial collection or mass lesion. Normal cerebral white matter.   Vascular: Normal arterial flow voids   Skull and upper cervical spine: Negative   Sinuses/Orbits: Paranasal sinuses clear.  Negative orbit   Other: None   IMPRESSION: Negative MRI head without contrast.     Latest Ref Rng & Units 02/17/2021    1:35 PM 01/26/2019    9:09 AM 09/29/2016    5:24 AM  CMP  Glucose 70 - 99 mg/dL 811  914  782   BUN 6 - 20 mg/dL 14  24  9    Creatinine 0.44 - 1.00 mg/dL 9.56  2.13  0.86   Sodium 135 - 145 mmol/L 137  139  140   Potassium 3.5 - 5.1 mmol/L 4.0  4.3  4.0   Chloride 98 - 111 mmol/L 105  108  111   CO2 22 -  32 mmol/L 25  26  23    Calcium 8.9 - 10.3 mg/dL 9.1  9.8  8.7   Total Protein 6.1 - 8.1 g/dL 6.1 - 8.1 g/dL  7.0    6.7  6.2   Total Bilirubin 0.2 - 1.2 mg/dL  0.6  0.7   Alkaline Phos 38 - 126 U/L   58   AST 10 - 35 U/L  16  15   ALT 6 - 29 U/L  24  24       Latest Ref Rng & Units 02/17/2021    1:35 PM 01/26/2019    9:09 AM 12/26/2016    6:54 AM  CBC  WBC 4.0 - 10.5 K/uL 6.0  6.9    Hemoglobin 12.0 - 15.0 g/dL 16.1  09.6  04.5   Hematocrit 36.0 - 46.0 % 43.5  43.9    Platelets 150 - 400 K/uL 241  264     Recent  Results (from the past 2160 hour(s))  B12 and Folate Panel     Status: None   Collection Time: 06/24/23  9:17 AM  Result Value Ref Range   Vitamin B-12 601 232 - 1,245 pg/mL   Folate 8.3 >3.0 ng/mL    Comment: A serum folate concentration of less than 3.1 ng/mL is considered to represent clinical deficiency.   Methylmalonic acid, serum     Status: None   Collection Time: 06/24/23  9:17 AM  Result Value Ref Range   Methylmalonic Acid 137 0 - 378 nmol/L  Vitamin B1     Status: None (Preliminary result)   Collection Time: 06/24/23  9:17 AM  Result Value Ref Range   Thiamine WILL FOLLOW   Vitamin B2, Whole Blood     Status: None (Preliminary result)   Collection Time: 06/24/23  9:17 AM  Result Value Ref Range   Vitamin B2, Whole Blood WILL FOLLOW      Review of Systems: Patient complains of symptoms per HPI as well as the following symptoms none. Pertinent negatives and positives per HPI. All others negative.   Social History   Socioeconomic History   Marital status: Married    Spouse name: Onalee Hua   Number of children: Not on file   Years of education: Not on file   Highest education level: Not on file  Occupational History   Occupation: CRNA  Tobacco Use   Smoking status: Never    Passive exposure: Current   Smokeless tobacco: Never  Vaping Use   Vaping status: Never Used  Substance and Sexual Activity   Alcohol use: No   Drug use: No   Sexual activity: Not on file    Comment: ablation  Other Topics Concern   Not on file  Social History Narrative   Married since 1985.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   Social Determinants of Health   Financial Resource Strain: Not on file  Food Insecurity: Low Risk  (06/09/2023)   Received from [facility]   Hunger Vital Sign    Worried About Running Out of Food in the Last Year: Never true    Ran Out of Food in the Last Year: Never true  Transportation Needs: No Transportation Needs (06/09/2023)   Received from [employer]    In the past 12 months, has lack of reliable transportation kept you from medical appointments, meetings, work or from getting things needed for daily living? : No  Physical Activity: Not on file  Stress: Not on file  Social Connections: Unknown (05/06/2023)   Received from [facility]   Social Network    Social Network: Not on file  Intimate Partner Violence: Unknown (05/06/2023)   Received from [facility]   HITS    Physically Hurt: Not on file    Insult or Talk Down To: Not on file    Threaten Physical Harm: Not on file    Scream or Curse: Not on file    Family History  Problem Relation Age of Onset   Deep vein thrombosis Mother    Colon polyps Mother    Heart disease Mother    Depression Mother    Anxiety disorder Mother    Schizophrenia Mother    Obesity Mother    Cancer Father    Hyperlipidemia Father    Gastric cancer Father    Diabetes Father    Heart disease Father    Hypertension Father    Stomach cancer Father    Diabetes Paternal Grandmother    Heart disease Paternal Grandmother    Aortic stenosis Son    Breast cancer Maternal Aunt    Colon cancer Neg Hx    Allergic rhinitis Neg Hx    Asthma Neg Hx    Angioedema Neg Hx    Eczema Neg Hx    Urticaria Neg Hx    Rectal cancer Neg Hx    Esophageal cancer Neg Hx     Past Medical History:  Diagnosis Date   Acute pancreatitis    Autoimmune thyroiditis    Constipation    Diverticulitis    Gastritis    GERD (gastroesophageal reflux disease)    GI work up in progress   Hashimoto's thyroiditis    Headache    Heartburn    Herpes    Joint pain    Mass on back    Obesity    Pancreatic disease    PONV (postoperative nausea and vomiting)    Rheumatoid arthritis (HCC)    SOBOE (shortness of breath on exertion)    Thyroid condition    Vitamin D deficiency     Patient Active Problem List   Diagnosis Date Noted   Hashimoto's thyroiditis 07/07/2022   H/o Low serum cortisol  level 07/07/2022   Prediabetes 06/30/2022   Hypercholesterolemia 05/15/2022   Other hyperlipidemia 04/15/2022   Vitamin D deficiency 04/15/2022   Pre-diabetes 04/15/2022   Rheumatoid arthritis (HCC) 04/15/2022   Closed fracture of right clavicle 04/30/2019   Abdominal pain, epigastric 09/30/2016   Acute pancreatitis 09/27/2016   Constipation 09/27/2016   Overweight (BMI 25.0-29.9) 09/27/2016   Mass of right lower leg 08/11/2013   Mass on back 08/11/2013    Past Surgical History:  Procedure Laterality Date   COLONOSCOPY     DILITATION & CURRETTAGE/HYSTROSCOPY WITH HYDROTHERMAL ABLATION N/A 12/26/2016   Procedure: DILATATION & CURETTAGE/HYSTEROSCOPY WITH HYDROTHERMAL ABLATION;  Surgeon: Marcelle Overlie, MD;  Location: [facility];  Service: Gynecology;  Laterality: N/A;   MASS EXCISION Right 02/23/2018   Procedure: EXCISION BACK MASS ERAS PATHWAY;  Surgeon: Emelia Loron, MD;  Location: [facility];  Service: General;  Laterality: Right;   POLYPECTOMY     TONSILLECTOMY AND ADENOIDECTOMY     TYMPANOSTOMY TUBE PLACEMENT      Current Outpatient Medications  Medication Sig Dispense Refill   amoxicillin (AMOXIL) 500 MG capsule Take 1 capsule (500 mg total) by mouth 3 (three) times daily for 10 days 30 capsule 0   aspirin EC (ASPIRIN 81) 81 MG  tablet Take 1 tablet by mouth once daily 30 tablet 0   celecoxib (CELEBREX) 50 MG capsule Take 2 capsules (100 mg total) by mouth 2 (two)  times daily as needed for Pain. 60 capsule 1   celecoxib (CELEBREX) 50 MG capsule Take 2 capsules (100 mg total) by mouth 2 (two) times daily as needed for pain 60 capsule 1   Ergocalciferol 50 MCG (2000 UT) CAPS daily.     Fremanezumab-vfrm (AJOVY) 225 MG/1.5ML SOAJ Inject 225 mg into the skin every 30 (thirty) days.     Magnesium Gluconate 550 MG TABS Take 550 mg by mouth. 2 to 3 times a week     methocarbamol (ROBAXIN) 500 MG tablet Take 1.5 tablets (750 mg total) by mouth  every 6 (six) hours as needed. 60 tablet 0   Multiple Vitamin (MULTI-VITAMIN) tablet Take 1 tablet by mouth daily.     nirmatrelvir & ritonavir (PAXLOVID, 300/100,) 20 x 150 MG & 10 x 100MG  TBPK Take 3 tablets by mouth 2 (two) times daily. 30 tablet 0   Omega-3 1000 MG CAPS Take by mouth.     rosuvastatin (CRESTOR) 5 MG tablet Take 1 tablet (5 mg total) by mouth daily. 90 tablet 0   Turmeric 400 MG CAPS See admin instructions.     valACYclovir (VALTREX) 1000 MG tablet Take 1 tablet (1,000 mg total) by mouth 2 (two) times daily. 180 tablet 3   zolpidem (AMBIEN) 10 MG tablet Take 1 tablet (10 mg total) by mouth at bedtime. 30 tablet 3   No current facility-administered medications for this visit.    Allergies as of 06/24/2023 - Review Complete 06/24/2023  Allergen Reaction Noted   Doxycycline Nausea And Vomiting 06/21/2019   Flulaval quadrivalent [influenza vac split quad] Other (See Comments) 10/31/2019   Hydroxychloroquine Other (See Comments) 08/16/2020    Vitals: BP 113/75   Pulse 86   Ht 5\' 4"  (1.626 m)   Wt 185 lb (83.9 kg)   BMI 31.76 kg/m  Last Weight:  Wt Readings from Last 1 Encounters:  06/24/23 185 lb (83.9 kg)   Last Height:   Ht Readings from Last 1 Encounters:  06/24/23 5\' 4"  (1.626 m)     Physical exam: Exam: Gen: NAD, conversant, well nourised, obese, well groomed                     CV: RRR, no MRG. No Carotid Bruits. No peripheral edema, warm, nontender Eyes: Conjunctivae clear without exudates or hemorrhage  Neuro: Detailed Neurologic Exam  Speech:    Speech is normal; fluent and spontaneous with normal comprehension.  Cognition:    The patient is oriented to person, place, and time;     recent and remote memory intact;     language fluent;     normal attention, concentration,     fund of knowledge Cranial Nerves:    The pupils are equal, round, and reactive to light. The fundi are normal and spontaneous venous pulsations are present. Visual  fields are full to finger confrontation. Extraocular movements are intact. Trigeminal sensation is intact and the muscles of mastication are normal. The face is symmetric. The palate elevates in the midline. Hearing intact. Voice is normal. Shoulder shrug is normal. The tongue has normal motion without fasciculations.   Coordination:    Normal finger to nose and heel to shin.   Gait:    Heel-toe and tandem gait with imbalance. Decreased arm swing  Motor Observation:  No asymmetry, no atrophy, and no involuntary movements noted. Tone:    Normal muscle tone.    Posture:    Posture is normal. normal erect    Strength:    Strength is V/V in the upper and lower limbs.      Sensation: intact to LT     Reflex Exam:  DTR's: left arm reflexes hypo compared to right.     Deep tendon reflexes in the upper and lower extremities are normal bilaterally.   Toes:    The toes are downgoing bilaterally.   Clonus:    Clonus is absent.    Assessment/Plan:  Dizziness and imbalance and falls, vertigo, hearing lossoften when she has lesions in the mouth attributed to HSV outbreak but has it at other times as well, not orthostatic. This may be the case but it would be unusual, she needs further evaluation.   Vestibular migraines? Treat for migraines and see if improves - Ajovy x 3 months and will see her back before she runs out MRi brain w/wo contrast IAC protocol (thin cuts between the brain stem and ear to see the 8th cranial nerve closer) to look for schwannoma and other etiologies such as CP angle lesions/tumors CT Temporal bones wo CM for semicircular canal dehiscence or other  Paroxysmal ataxia?: Paroxysmal ataxia is a rare movement disorder typically characterized by early-onset attacks of cerebellar ataxia that can last from a few seconds to several days. Duration of the attacks and triggering factors mostly depend on the underlying cause. Test later.   Heart condition? 30-day cardiac  monitor  DAT Scan for Parkinsonism (imbalance, loss of smell, falls, decreaed arm swing)  Behcet's Disease?  - [facility]: HLA B51 Behcet's Disease association test? - Ask Almond Lint to see if she can biopsy when patient gets a lesion  Orders Placed This Encounter  Procedures   MR BRAIN/IAC W WO CONTRAST   CT TEMPORAL BONES W CONTRAST   NM BRAIN DATSCAN TUMOR LOC INFLAM SPECT 1 DAY   B12 and Folate Panel   Methylmalonic acid, serum   Vitamin B1   Vitamin B6   Vitamin B2, Whole Blood   CARDIAC EVENT MONITOR   Meds ordered this encounter  Medications   Fremanezumab-vfrm (AJOVY) 225 MG/1.5ML SOAJ    Sig: Inject 225 mg into the skin every 30 (thirty) days.    Cc: Barnett Abu, MD,  Aliene Beams, MD  Naomie Dean, MD  [facility] [address] [place], [place] [address]  Phone [phone] Fax [phone]

## 2023-06-29 ENCOUNTER — Telehealth: Payer: Self-pay | Admitting: Neurology

## 2023-06-29 NOTE — Telephone Encounter (Signed)
sent to GI they obtain [employer] auth [phone] 

## 2023-06-30 ENCOUNTER — Telehealth: Payer: Self-pay | Admitting: Neurology

## 2023-06-30 NOTE — Telephone Encounter (Signed)
no auth required sent to [facility] nuclear medicine [phone]

## 2023-07-01 LAB — VITAMIN B1: Thiamine: 189.3 nmol/L (ref 66.5–200.0)

## 2023-07-01 LAB — B12 AND FOLATE PANEL
Folate: 8.3 ng/mL (ref >3.0)
Vitamin B-12: 601 pg/mL (ref 232–1245)

## 2023-07-01 LAB — METHYLMALONIC ACID, SERUM: Methylmalonic Acid: 137 nmol/L (ref 0–378)

## 2023-07-01 LAB — VITAMIN B2, WHOLE BLOOD: Vitamin B2, Whole Blood: 306 ug/L (ref 137–370)

## 2023-07-03 ENCOUNTER — Other Ambulatory Visit (HOSPITAL_COMMUNITY): Payer: Self-pay

## 2023-07-03 ENCOUNTER — Other Ambulatory Visit: Payer: Self-pay

## 2023-07-06 ENCOUNTER — Encounter: Payer: Self-pay | Admitting: Internal Medicine

## 2023-07-06 ENCOUNTER — Ambulatory Visit (INDEPENDENT_AMBULATORY_CARE_PROVIDER_SITE_OTHER): Payer: Managed Care, Other (non HMO) | Admitting: Internal Medicine

## 2023-07-06 VITALS — BP 120/70 | HR 75 | Ht 64.0 in | Wt 184.2 lb

## 2023-07-06 DIAGNOSIS — R7989 Other specified abnormal findings of blood chemistry: Secondary | ICD-10-CM

## 2023-07-06 DIAGNOSIS — E063 Autoimmune thyroiditis: Secondary | ICD-10-CM

## 2023-07-06 DIAGNOSIS — E559 Vitamin D deficiency, unspecified: Secondary | ICD-10-CM | POA: Diagnosis not present

## 2023-07-06 MED ORDER — VITAMIN D3 50 MCG (2000 UT) PO CAPS: 2000.0000 [IU] | ORAL_CAPSULE | Freq: Every day | ORAL | Status: AC

## 2023-07-06 NOTE — Progress Notes (Unsigned)
Patient ID: Robin Maynard, female   DOB: [birth year], 57 y.o.   MRN: [id number]   HPI  Robin Maynard is a 57 y.o.-year-old female, initially referred by her PCP, Dr.Le, returning for follow-up for for low cortisol level -ruled out for adrenal insufficiency, also, euthyroid Hashimoto's thyroiditis.  Last visit 1 year ago.  Interim history: She is feeling well, without complaints today. She had extensive investigation by rheumatology (Dr. Clelia Croft) for RA.  She also sees neurology (Dr. Lucia Gaskins) - she has dizziness, imbalance, falls, B6 and B12 deficiency and parkinsonism.  She is currently under investigation. She stopped MVI 2 days ago. She was started on ASA and Crestor 5 mg daily for carotid plaque incidentally found during imaging of her neck.  History of low cortisol level: She had URI 09/2016 >> L upper abd. Pain >> ED >> CT scan - hazing around sSMA >> referred to Dr. Christella Hartigan >> further investig. Was negative >> sent back to PCP, who checked a cortisol level >> low cortisol level in 03/2017.  No h/o steroid use in the past (Dexamethasone 10 mg in 12/2016). No h/o Depo-provera, Megace, po ketoconazole, phenytoin, rifampin, chronic fluconazole use. No h/o autoimmune diseases in pt or family mbs. No excess use of NSAIDs. No h/o generalized infections or HIV. No IVDA. No h/o head injury or severe HA. No h/o malignancy.  Reviewed previous investigation:         Office Visit on 08/10/2017  Component Date Value Ref Range Status   C206 ACTH 08/10/2017 20  6 - 50 pg/mL Final    Comment: Reference range applies only to specimens collected between 7am-10am     Cortisol, Plasma 08/10/2017 7.7  ug/dL Final    AM:  4.3 - 60.6 ug/dLPM:  3.1 - 16.7 ug/dL   Cortisol, Plasma 30/16/0109 19.2  ug/dL Final    AM:  4.3 - 32.3 ug/dLPM:  3.1 - 16.7 ug/dL   Cortisol, Plasma 55/73/2202 22.7  ug/dL Final    AM:  4.3 - 54.2 ug/dLPM:  3.1 - 16.7 ug/dL   70/62/3762: cortisol 4.9 (am)  No history of  hyponatremia or hyperkalemia:   Pt. also has a history of DUB, s/p ablation 12/2016, GERD, elevated FSH of 53 in 03/2017.  Reviewed history: Patient describes that in ~2017 she started to be very tired and this exacerbated over time.  However, since 04/2018 she feels that her fatigue improved slightly.  Her PCP investigated her for possible autoimmune diseases and also chronic viral infections and the investigation was negative except for elevated thyroglobulin antibodies.  She was referred back to endocrinology.  Hashimoto's thyroiditis:  Reviewed previous investigation: Component     Latest Ref Rng & Units 07/04/2021  Thyroglobulin Ab     < or = 1 IU/mL 4 (H)  Thyroperoxidase Ab SerPl-aCnc     <9 IU/mL 2   Component     Latest Ref Rng & Units 10/26/2018 06/29/2019 07/03/2020  Thyroglobulin Ab     < or = 1 IU/mL 6 (H) 5 (H) 8 (H)  Thyroperoxidase Ab SerPl-aCnc     <9 IU/mL   2  06/08/2018: Thyroglobulin Ab 6.7, TPO Ab 10  Component     Latest Ref Rng & Units 06/29/2019  Thyroglobulin Ab     < or = 1 IU/mL 5 (H)  VITD     30.00 - 100.00 ng/mL 33.54  Vitamin B12     211 - 911 pg/mL 812   Previous TSH levels: Lab Results  Component Value Date   TSH 1.66 07/24/2022   TSH 1.180 04/01/2022   TSH 1.76 07/04/2021   TSH 3.71 02/19/2021   TSH 1.58 07/03/2020   TSH 1.69 06/29/2019   TSH 1.41 10/26/2018   TSH 1.48 06/08/2018   I recommended to start selenium to help lower her antithyroid antibodies.  She started a multivitamin + Selenium 150 mcg daily and she felt much better afterwards. This contains Biotin 1200 mcg in MVI.  She previously saw rheumatology.  She was found to have hypogammaglobulinemia. She also saw an immunologist >> she was reassured about this condition.  However, she was then seen by rheumatology for a second opinion at [facility] >> possible RA. Started HO Chloroquine >> nausea, GERD, metallic taste in mouth >> stopped.   She is on Turmeric, ginger root, omega 3  FAs.   Reviewed previous vitamin D levels: 04/01/2022: Vitamin D 44 Lab Results  Component Value Date   VD25OH 28.55 (L) 07/04/2021   VD25OH 33.54 06/29/2019  She continues on vitamin D 2000 IU daily.  In 2023, her rheumatologist recommended acromegaly check 2/2 enlarged hands and feet.  She mentioned that over the years, her shoe size increased from 8-9.  Also, she felt that her hands are larger.  Her wedding ring was tight.  She denied increased headaches, increased dental spacing, increased sweating.  She had joint aches-RA, but no increased sweating, headaches, prognatism, wide interdental spaces.  A fasting growth hormone and IGF-I levels were normal: Component     Latest Ref Rng 07/24/2022  IGF-I, LC/MS     50 - 317 ng/mL 97   Z-Score (Female)     -2.0 - 2.0 SD -0.7   Growth Hormone     < OR = 7.1 ng/mL 0.1   No signs of acromegaly.  ROS: + see HPI  I reviewed pt's medications, allergies, PMH, social hx, family hx, and changes were documented in the history of present illness. Otherwise, unchanged from my initial visit note.  Past Medical History:  Diagnosis Date   Acute pancreatitis    Autoimmune thyroiditis    Constipation    Diverticulitis    Gastritis    GERD (gastroesophageal reflux disease)    GI work up in progress   Hashimoto's thyroiditis    Headache    Heartburn    Herpes    Joint pain    Mass on back    Obesity    Pancreatic disease    PONV (postoperative nausea and vomiting)    Rheumatoid arthritis (HCC)    SOBOE (shortness of breath on exertion)    Thyroid condition    Vitamin D deficiency    Past Surgical History:  Procedure Laterality Date   COLONOSCOPY     DILITATION & CURRETTAGE/HYSTROSCOPY WITH HYDROTHERMAL ABLATION N/A 12/26/2016   Procedure: DILATATION & CURETTAGE/HYSTEROSCOPY WITH HYDROTHERMAL ABLATION;  Surgeon: Marcelle Overlie, MD;  Location: [facility];  Service: Gynecology;  Laterality: N/A;   MASS EXCISION Right  02/23/2018   Procedure: EXCISION BACK MASS ERAS PATHWAY;  Surgeon: Emelia Loron, MD;  Location: [facility];  Service: General;  Laterality: Right;   POLYPECTOMY     TONSILLECTOMY AND ADENOIDECTOMY     TYMPANOSTOMY TUBE PLACEMENT     Social History   Socioeconomic History   Marital status: Married    Spouse name: Not on file   Number of children: 1  Social Needs  Occupational History   [employer]  Tobacco Use  Smoking status: Never Smoker   Smokeless tobacco: Never Used  Substance and Sexual Activity   Alcohol use: No   Drug use: No   Current Outpatient Medications on File Prior to Visit  Medication Sig Dispense Refill   amoxicillin (AMOXIL) 500 MG capsule Take 1 capsule (500 mg total) by mouth 3 (three) times daily for 10 days 30 capsule 0   aspirin EC (ASPIRIN 81) 81 MG tablet Take 1 tablet by mouth once daily 30 tablet 0   celecoxib (CELEBREX) 50 MG capsule Take 2 capsules (100 mg total) by mouth 2 (two)  times daily as needed for Pain. 60 capsule 1   celecoxib (CELEBREX) 50 MG capsule Take 2 capsules (100 mg total) by mouth 2 (two) times daily as needed for pain 60 capsule 1   Ergocalciferol 50 MCG (2000 UT) CAPS daily.     Fremanezumab-vfrm (AJOVY) 225 MG/1.5ML SOAJ Inject 225 mg into the skin every 30 (thirty) days.     Magnesium Gluconate 550 MG TABS Take 550 mg by mouth. 2 to 3 times a week     methocarbamol (ROBAXIN) 500 MG tablet Take 1.5 tablets (750 mg total) by mouth every 6 (six) hours as needed. 60 tablet 0   Multiple Vitamin (MULTI-VITAMIN) tablet Take 1 tablet by mouth daily.     nirmatrelvir & ritonavir (PAXLOVID, 300/100,) 20 x 150 MG & 10 x 100MG  TBPK Take 3 tablets by mouth 2 (two) times daily. 30 tablet 0   Omega-3 1000 MG CAPS Take by mouth.     rosuvastatin (CRESTOR) 5 MG tablet Take 1 tablet (5 mg total) by mouth daily. 90 tablet 0   Turmeric 400 MG CAPS See admin instructions.     valACYclovir (VALTREX) 1000 MG tablet Take 1  tablet (1,000 mg total) by mouth 2 (two) times daily. 180 tablet 3   zolpidem (AMBIEN) 10 MG tablet Take 1 tablet (10 mg total) by mouth at bedtime. 30 tablet 3   No current facility-administered medications on file prior to visit.   Allergies  Allergen Reactions   Doxycycline Nausea And Vomiting   Flulaval Quadrivalent [Influenza Vac Split Quad] Other (See Comments)    Weakness and don't feel good from the vaccine   Hydroxychloroquine Other (See Comments)    Nausea/malaise/reflux    Family History  Problem Relation Age of Onset   Deep vein thrombosis Mother    Colon polyps Mother    Heart disease Mother    Depression Mother    Anxiety disorder Mother    Schizophrenia Mother    Obesity Mother    Cancer Father    Hyperlipidemia Father    Gastric cancer Father    Diabetes Father    Heart disease Father    Hypertension Father    Stomach cancer Father    Diabetes Paternal Grandmother    Heart disease Paternal Grandmother    Aortic stenosis Son    Breast cancer Maternal Aunt    Colon cancer Neg Hx    Allergic rhinitis Neg Hx    Asthma Neg Hx    Angioedema Neg Hx    Eczema Neg Hx    Urticaria Neg Hx    Rectal cancer Neg Hx    Esophageal cancer Neg Hx     PE: BP 120/70   Pulse 75   Ht 5\' 4"  (1.626 m)   Wt 184 lb 3.2 oz (83.6 kg)   SpO2 97%   BMI 31.62 kg/m  Wt Readings from Last 10  Encounters:  07/06/23 184 lb 3.2 oz (83.6 kg)  06/24/23 185 lb (83.9 kg)  10/22/22 176 lb (79.8 kg)  07/07/22 173 lb 9.6 oz (78.7 kg)  06/30/22 170 lb (77.1 kg)  05/15/22 169 lb (76.7 kg)  04/15/22 173 lb (78.5 kg)  04/01/22 176 lb (79.8 kg)  09/25/21 179 lb (81.2 kg)  07/04/21 179 lb 6.4 oz (81.4 kg)   Constitutional: overweight, in NAD Eyes:  EOMI, no exophthalmos ENT: no neck masses, no cervical lymphadenopathy Cardiovascular: Tachycardia, RR, No MRG Respiratory: CTA B Musculoskeletal: no deformities Skin:no rashes Neurological: no tremor with outstretched  hands  ASSESSMENT: 1. H/o Low cortisol level  2.  Euthyroid Hashimoto's thyroiditis  3.  Vitamin D deficiency  PLAN:  H/o Low cortisol level -Patient with history of fatigue, abdominal pain, and a slightly low a.m. cortisol in 2018.  She remembers feeling great after 10 mg of dexamethasone which was given at the time of her endometrial ablation in 12/2016.  We discussed that this was not unusual, even in patients without adrenal insufficiency -Her adrenals appeared normal on review of the CT scan reports from 12/2016 and 05/2021 -We investigated her with a cosyntropin stimulation test, which was normal in 2019 -In 2022, she returned feeling much better, with an 11 pound intentional weight loss after doing intermittent fasting and she lost 9 more pounds before our visit from 2023.   -However, at this visit, she gained 11 pounds since last visit.  No anorexia, nausea, abdominal pain, or other signs of adrenal insufficiency -No further investigation is needed for this but we will continue to follow her clinically  2. Euthyroid Hashimoto's thyroiditis -Diagnosed based on elevated antithyroid antibodies -She is on selenium (150 mcg daily along with a multivitamin) which may help reduce antibody levels -she felt much better after adding this.  She skipped 2 doses in preparation for this appointment. -Latest TFTs were normal in 07/2022: Lab Results  Component Value Date   TSH 1.66 07/24/2022  -We will recheck her TFTs now along with antibody levels  3.  Vitamin D deficiency -Joint pains improved after changing her diet in the past -She is currently on 2000 units vitamin D daily -Latest vitamin D level was normal: 04/01/2022: Vitamin D 44 -Will recheck this today - I we will see the patient back in a year  Carlus Pavlov, MD PhD [facility] Endocrinology

## 2023-07-06 NOTE — Patient Instructions (Signed)
Please stop at the lab.  Please return for a follow-up appointment in 1 year.

## 2023-07-08 LAB — TSH: TSH: 2.19 m[IU]/L (ref 0.40–4.50)

## 2023-07-08 LAB — T4, FREE: Free T4: 1.1 ng/dL (ref 0.8–1.8)

## 2023-07-08 LAB — THYROGLOBULIN ANTIBODY: Thyroglobulin Ab: 3 [IU]/mL — ABNORMAL HIGH (ref <=1)

## 2023-07-08 LAB — VITAMIN D 25 HYDROXY (VIT D DEFICIENCY, FRACTURES): Vit D, 25-Hydroxy: 31 ng/mL (ref 30–100)

## 2023-07-08 LAB — THYROID PEROXIDASE ANTIBODY: Thyroperoxidase Ab SerPl-aCnc: 2 [IU]/mL (ref <9)

## 2023-07-08 LAB — T3, FREE: T3, Free: 3.6 pg/mL (ref 2.3–4.2)

## 2023-07-17 ENCOUNTER — Other Ambulatory Visit (HOSPITAL_COMMUNITY): Payer: Self-pay

## 2023-07-18 ENCOUNTER — Ambulatory Visit
Admission: RE | Admit: 2023-07-18 | Discharge: 2023-07-18 | Disposition: A | Discharge status: Home / Self Care | Payer: Managed Care, Other (non HMO) | Source: Ambulatory Visit | Attending: Neurology | Admitting: Neurology

## 2023-07-18 DIAGNOSIS — R2689 Other abnormalities of gait and mobility: Secondary | ICD-10-CM

## 2023-07-18 DIAGNOSIS — R42 Dizziness and giddiness: Secondary | ICD-10-CM | POA: Diagnosis not present

## 2023-07-18 DIAGNOSIS — H9190 Unspecified hearing loss, unspecified ear: Secondary | ICD-10-CM

## 2023-07-18 DIAGNOSIS — H814 Vertigo of central origin: Secondary | ICD-10-CM

## 2023-07-18 DIAGNOSIS — R296 Repeated falls: Secondary | ICD-10-CM

## 2023-07-18 DIAGNOSIS — H93A9 Pulsatile tinnitus, unspecified ear: Secondary | ICD-10-CM

## 2023-07-18 DIAGNOSIS — S0460XA Injury of acoustic nerve, unspecified side, initial encounter: Secondary | ICD-10-CM

## 2023-07-18 MED ORDER — GADOPICLENOL 0.5 MMOL/ML IV SOLN
8.0000 mL | Freq: Once | INTRAVENOUS | Status: AC | PRN
  Administered 2023-07-18: 8 mL via INTRAVENOUS

## 2023-07-21 ENCOUNTER — Encounter: Payer: Self-pay | Admitting: Neurology

## 2023-07-23 ENCOUNTER — Other Ambulatory Visit (HOSPITAL_COMMUNITY): Payer: Self-pay

## 2023-07-23 MED ORDER — AJOVY 225 MG/1.5ML ~~LOC~~ SOAJ
225.0000 mg | SUBCUTANEOUS | 2 refills | Status: AC
  Filled 2023-07-23: qty 1.5, 30d supply, fill #0

## 2023-08-07 ENCOUNTER — Other Ambulatory Visit (HOSPITAL_COMMUNITY): Payer: Self-pay

## 2023-08-07 ENCOUNTER — Encounter: Payer: Self-pay | Admitting: Nurse Practitioner

## 2023-08-07 ENCOUNTER — Other Ambulatory Visit: Payer: Self-pay | Admitting: Nurse Practitioner

## 2023-08-07 ENCOUNTER — Other Ambulatory Visit (INDEPENDENT_AMBULATORY_CARE_PROVIDER_SITE_OTHER): Payer: Managed Care, Other (non HMO)

## 2023-08-07 ENCOUNTER — Ambulatory Visit: Payer: Managed Care, Other (non HMO) | Admitting: Nurse Practitioner

## 2023-08-07 VITALS — BP 120/74 | HR 73 | Ht 64.0 in | Wt 183.2 lb

## 2023-08-07 DIAGNOSIS — R1012 Left upper quadrant pain: Secondary | ICD-10-CM

## 2023-08-07 LAB — COMPREHENSIVE METABOLIC PANEL
ALT: 30 U/L (ref 0–35)
AST: 19 U/L (ref 0–37)
Albumin: 4.7 g/dL (ref 3.5–5.2)
Alkaline Phosphatase: 78 U/L (ref 39–117)
BUN: 20 mg/dL (ref 6–23)
CO2: 25 meq/L (ref 19–32)
Calcium: 9.7 mg/dL (ref 8.4–10.5)
Chloride: 105 meq/L (ref 96–112)
Creatinine, Ser: 0.92 mg/dL (ref 0.40–1.20)
GFR: 68.77 mL/min (ref >60.00)
Glucose, Bld: 100 mg/dL — ABNORMAL HIGH (ref 70–99)
Potassium: 4.3 meq/L (ref 3.5–5.1)
Sodium: 140 meq/L (ref 135–145)
Total Bilirubin: 0.9 mg/dL (ref 0.2–1.2)
Total Protein: 7 g/dL (ref 6.0–8.3)

## 2023-08-07 LAB — CBC WITH DIFFERENTIAL/PLATELET
Basophils Absolute: 0.1 10*3/uL (ref 0.0–0.1)
Basophils Relative: 0.6 % (ref 0.0–3.0)
Eosinophils Absolute: 0.1 10*3/uL (ref 0.0–0.7)
Eosinophils Relative: 1.4 % (ref 0.0–5.0)
HCT: 46 % (ref 36.0–46.0)
Hemoglobin: 15.3 g/dL — ABNORMAL HIGH (ref 12.0–15.0)
Lymphocytes Relative: 23.3 % (ref 12.0–46.0)
Lymphs Abs: 1.9 10*3/uL (ref 0.7–4.0)
MCHC: 33.3 g/dL (ref 30.0–36.0)
MCV: 96.3 fL (ref 78.0–100.0)
Monocytes Absolute: 0.5 10*3/uL (ref 0.1–1.0)
Monocytes Relative: 5.6 % (ref 3.0–12.0)
Neutro Abs: 5.6 10*3/uL (ref 1.4–7.7)
Neutrophils Relative %: 69.1 % (ref 43.0–77.0)
Platelets: 290 10*3/uL (ref 150.0–400.0)
RBC: 4.77 Mil/uL (ref 3.87–5.11)
RDW: 13.6 % (ref 11.5–15.5)
WBC: 8.1 10*3/uL (ref 4.0–10.5)

## 2023-08-07 LAB — LIPASE: Lipase: 34 U/L (ref 11.0–59.0)

## 2023-08-07 MED ORDER — SUCRALFATE 1 GM/10ML PO SUSP
1.0000 g | Freq: Two times a day (BID) | ORAL | 0 refills | Status: DC
  Filled 2023-08-07 (×2): qty 200, 10d supply, fill #0

## 2023-08-07 MED ORDER — SUCRALFATE 1 GM/10ML PO SUSP
1.0000 g | Freq: Two times a day (BID) | ORAL | 0 refills | Status: DC
  Filled 2023-08-07: qty 200, 10d supply, fill #0

## 2023-08-07 MED ORDER — OMEPRAZOLE 20 MG PO CPDR
20.0000 mg | DELAYED_RELEASE_CAPSULE | Freq: Every day | ORAL | 0 refills | Status: DC
  Filled 2023-08-07 – 2023-08-10 (×3): qty 30, 30d supply, fill #0

## 2023-08-07 NOTE — Patient Instructions (Addendum)
 Your provider has requested that you go to the basement level for lab work before leaving today. Press B on the elevator. The lab is located at the first door on the left as you exit the elevator.  Contact Colleen,NP on Monday 08/10/23 with an update.  We have sent the following medications to your pharmacy for you to pick up at your convenience: Carafate    Due to recent changes in healthcare laws, you may see the results of your imaging and laboratory studies on MyChart before your provider has had a chance to review them.  We understand that in some cases there may be results that are confusing or concerning to you. Not all laboratory results come back in the same time frame and the provider may be waiting for multiple results in order to interpret others.  Please give us  48 hours in order for your provider to thoroughly review all the results before contacting the office for clarification of your results.   Thank you for trusting me with your gastrointestinal care!   Elida Shawl, CRNP

## 2023-08-07 NOTE — Progress Notes (Signed)
 08/07/2023 ROSALIE BUENAVENTURA [id number] [birth year]   Chief Complaint: LUQ pain  History of Present Illness: Hinda Lindor is a 58 year old female with a past medical history of rheumatoid arthritis, Hashimoto's thyroiditis, GERD, diverticulosois and colon polyps. Previously followed by Dr. Teressa. She presents her office today for further evaluation regarding LUQ pain.  She endorsed eating a scientist, physiological while in the [place] 1 week ago.  She recalled taking a few bites of the danish which felt grainy and crunchy, and when she swallowed the piece of danish, it felt like something was cutting her throat and esophagus as it passed down. Since then, she denies having any pain or difficulty swallowing but has sharp achy constant LUQ pain which does not improve or worsen when eating and does not interfere with sleep.  She denies having any heartburn or dysphagia.  She is passing normal brown formed stools daily. No black or bloody stools. She has fairly chronic nausea which she attributes to having headaches. She recently underwent a brain MRI due to having chronic balance issues which was normal.  She stated she does not have dizziness as documented in her chart.  She started taking Ajovy  injections for headaches on 07/29/2023. She underwent an EGD 01/2017 which showed chronic gastritis. Colonoscopy 04/2018 identified 1 tubular adenomatous and 1 hyperplastic polyp removed from the sigmoid and descending colon.  She had LLQ pain and underwent a CTAP 01/2021 which showed suggested focal area of mild wall thickening with adjacent inflammatory stranding around the sigmoid colon treated with Cipro  and Flagyl .  She subsequently underwent a colonoscopy 06/2021 which identified multiple small largemouth diverticula in the left colon, this area was mildly edematous and a bit tortuous.  Biopsies were negative for microscopic colitis or chronic inflammatory changes.  She was prescribed Lialda  which she took for  about 6 to 7 weeks then stopped it as her symptoms were improving prior to initiating Lialda .  Infrequent NSAID use.     Latest Ref Rng & Units 02/17/2021    1:35 PM 01/26/2019    9:09 AM 12/26/2016    6:54 AM  CBC  WBC 4.0 - 10.5 K/uL 6.0  6.9    Hemoglobin 12.0 - 15.0 g/dL 85.2  84.9  85.2   Hematocrit 36.0 - 46.0 % 43.5  43.9    Platelets 150 - 400 K/uL 241  264          Latest Ref Rng & Units 02/17/2021    1:35 PM 01/26/2019    9:09 AM 09/29/2016    5:24 AM  CMP  Glucose 70 - 99 mg/dL 894  891  881   BUN 6 - 20 mg/dL 14  24  9    Creatinine 0.44 - 1.00 mg/dL 9.30  9.18  9.26   Sodium 135 - 145 mmol/L 137  139  140   Potassium 3.5 - 5.1 mmol/L 4.0  4.3  4.0   Chloride 98 - 111 mmol/L 105  108  111   CO2 22 - 32 mmol/L 25  26  23    Calcium  8.9 - 10.3 mg/dL 9.1  9.8  8.7   Total Protein 6.1 - 8.1 g/dL 6.1 - 8.1 g/dL  7.0    6.7  6.2   Total Bilirubin 0.2 - 1.2 mg/dL  0.6  0.7   Alkaline Phos 38 - 126 U/L   58   AST 10 - 35 U/L  16  15   ALT 6 - 29  U/L  24  24     PAST GI PROCEDURES:  Colonoscopy 06/04/2021: - Multiple small and large-mouthed diverticula were found in the left colon. The mucosa in this region was slightly edematous and a bit tortuous.  - The examination was otherwise normal on direct and retroflexion views.  - The colon was randomly biopsied in left (jar 1) and right (jar 2) colon to check for microscopic inflammation. 1. Surgical [P], right colon - UNREMARKABLE COLONIC MUCOSA. - NO MICROSCOPIC COLITIS, ACTIVE INFLAMMATION OR GRANULOMAS. 2. Surgical [P], left colon - UNREMARKABLE COLONIC MUCOSA. - NO MICROSCOPIC COLITIS, ACTIVE INFLAMMATION OR CHRONIC CHANGES.  Colonoscopy 04/21/2018: - Two 3 to 4 mm polyps in the sigmoid colon and in the descending colon, removed with a cold snare. Resected and retrieved.  - External hemorrhoids.  - The examination was otherwise normal on direct and retroflexion views.  - 5 year recall colonoscopy  - TUBULAR ADENOMA  (ONE). - HYPERPLASTIC POLYP (ONE). - NO HIGH GRADE DYSPLASIA OR MALIGNANCY  EGD 01/20/2017: - Normal esophagus.  - Gastritis. Biopsied.  - Normal examined duodenum. - MILD CHRONIC GASTRITIS. - NEGATIVE FOR HELICOBACTER PYLORI. - NO INTESTINAL METAPLASIA, DYSPLASIA, OR MALIGNANCY.  Past Medical History:  Diagnosis Date   Acute pancreatitis    Autoimmune thyroiditis    Constipation    Diverticulitis    Gastritis    GERD (gastroesophageal reflux disease)    GI work up in progress   Hashimoto's thyroiditis    Headache    Heartburn    Herpes    Joint pain    Mass on back    Obesity    Pancreatic disease    PONV (postoperative nausea and vomiting)    Rheumatoid arthritis (HCC)    SOBOE (shortness of breath on exertion)    Thyroid  condition    Vitamin D  deficiency    Past Surgical History:  Procedure Laterality Date   COLONOSCOPY     DILITATION & CURRETTAGE/HYSTROSCOPY WITH HYDROTHERMAL ABLATION N/A 12/26/2016   Procedure: DILATATION & CURETTAGE/HYSTEROSCOPY WITH HYDROTHERMAL ABLATION;  Surgeon: Mat Browning, MD;  Location: [facility];  Service: Gynecology;  Laterality: N/A;   MASS EXCISION Right 02/23/2018   Procedure: EXCISION BACK MASS ERAS PATHWAY;  Surgeon: Ebbie Cough, MD;  Location: [facility];  Service: General;  Laterality: Right;   POLYPECTOMY     TONSILLECTOMY AND ADENOIDECTOMY     TYMPANOSTOMY TUBE PLACEMENT     Current Outpatient Medications on File Prior to Visit  Medication Sig Dispense Refill   aspirin  EC (ASPIRIN  81) 81 MG tablet Take 1 tablet by mouth once daily 30 tablet 0   celecoxib  (CELEBREX ) 50 MG capsule Take 2 capsules (100 mg total) by mouth 2 (two)  times daily as needed for Pain. 60 capsule 1   celecoxib  (CELEBREX ) 50 MG capsule Take 2 capsules (100 mg total) by mouth 2 (two) times daily as needed for pain 60 capsule 1   Cholecalciferol (VITAMIN D3) 50 MCG (2000 UT) capsule Take 1 capsule (2,000 Units  total) by mouth daily.     Fremanezumab -vfrm (AJOVY ) 225 MG/1.5ML SOAJ Inject 225 mg into the skin every 30 (thirty) days. 1.5 mL 2   Magnesium  Gluconate 550 MG TABS Take 550 mg by mouth. 2 to 3 times a week     Multiple Vitamin (MULTI-VITAMIN) tablet Take 1 tablet by mouth daily.     nirmatrelvir  & ritonavir  (PAXLOVID , 300/100,) 20 x 150 MG & 10 x 100MG  TBPK Take 3 tablets  by mouth 2 (two) times daily. 30 tablet 0   Omega-3 1000 MG CAPS Take by mouth.     rosuvastatin  (CRESTOR ) 5 MG tablet Take 1 tablet (5 mg total) by mouth daily. 90 tablet 0   Turmeric 400 MG CAPS See admin instructions.     valACYclovir  (VALTREX ) 1000 MG tablet Take 1 tablet (1,000 mg total) by mouth 2 (two) times daily. 180 tablet 3   zolpidem  (AMBIEN ) 10 MG tablet Take 1 tablet (10 mg total) by mouth at bedtime. 30 tablet 3   No current facility-administered medications on file prior to visit.   Allergies  Allergen Reactions   Doxycycline Nausea And Vomiting   Flulaval Quadrivalent [Influenza Vac Split Quad] Other (See Comments)    Weakness and don't feel good from the vaccine   Hydroxychloroquine Other (See Comments)    Nausea/malaise/reflux    Current Medications, Allergies, Past Medical History, Past Surgical History, Family History and Social History were reviewed in [employer] record.  Review of Systems:   Constitutional: Negative for fever, sweats, chills or weight loss.  Respiratory: Negative for shortness of breath.   Cardiovascular: Negative for chest pain, palpitations and leg swelling.  Gastrointestinal: See HPI.  Musculoskeletal: Negative for back pain or muscle aches.  Neurological: Negative for dizziness, headaches or paresthesias.   Physical Exam: BP 120/74   Pulse 73   Ht 5' 4 (1.626 m)   Wt 183 lb 4 oz (83.1 kg)   BMI 31.45 kg/m   General: 58 year old female in no acute distress. Head: Normocephalic and atraumatic. Eyes: No scleral icterus. Conjunctiva pink  . Ears: Normal auditory acuity. Mouth: Dentition intact. No ulcers or lesions.  Lungs: Clear throughout to auscultation. Heart: Regular rate and rhythm, no murmur. Abdomen: Soft, nondistended. Mild tenderness the LUQ and right of the umbilicus without rebound or guarding. No lower abdominal tenderness. No masses or hepatomegaly. Normal bowel sounds x 4 quadrants.  Rectal: Deferred.  Musculoskeletal: Symmetrical with no gross deformities. Extremities: No edema. Neurological: Alert oriented x 4. No focal deficits.  Psychological: Alert and cooperative. Normal mood and affect  Assessment and Recommendations:  58 year old female with LUQ pain triggered after eating a danish with grainy/crunchy matter which felt like it cut her throat and esophagus as passing down on Friday 07/31/2023. No heartburn or dysphagia. No current odynophagia.  -Carafate  suspension 10 g p.o. twice daily x 10 days not to be taken within 2 to 4 hours of any other medication -Omeprazole  20 mg 1 tab p.o. daily to be taken 30 minutes before breakfast x 4 weeks -I discussed scheduling an EGD if her symptoms persist. EGD benefits and risks discussed including risk with sedation, risk of bleeding, perforation and infection.  Patient to call me on Monday 08/10/2023 with an update, will schedule EGD if symptoms persist. -Soft diet -CBC, CMP and lipase  History of colon polyps.  Colonoscopy 04/2018 identified 1 tubular adenomatous and 1 hyperplastic polyp.  Patient was initially advised to repeat a colon polyp surveillance colonoscopy 04/2023 had a diagnostic colonoscopy 06/2021 due to having abnormal CT with LLQ pain which showed diverticular disease without colitis/IBD and no colon polyps. -To verify recall colonoscopy date  Diverticulosis.  Colonoscopy 06/2021 showed multiple small largemouth diverticula in the left colon, mucosa in this region was slightly edematous and tortuous, treated with  Lialda  x 6 to 7 weeks.

## 2023-08-08 ENCOUNTER — Other Ambulatory Visit (HOSPITAL_COMMUNITY): Payer: Self-pay

## 2023-08-08 ENCOUNTER — Other Ambulatory Visit: Payer: Self-pay

## 2023-08-10 ENCOUNTER — Telehealth: Payer: Self-pay | Admitting: Nurse Practitioner

## 2023-08-10 ENCOUNTER — Other Ambulatory Visit: Payer: Self-pay

## 2023-08-10 NOTE — Progress Notes (Signed)
 Attending Physician's Attestation   I have reviewed the chart.   I agree with the Advanced Practitioner's note, impression, and recommendations with any updates as below. Agree with moving forward with planned EGD for further evaluation of symptoms.  As patient had earlier colonoscopy follow-up in 2022, can plan to repeat colonoscopy for colon polyp surveillance/adenoma surveillance 7 years from 2022 (may place 2029 colonoscopy recall).   Aloha Finner, MD [facility] Advanced Endoscopy Office # [phone]

## 2023-08-10 NOTE — Progress Notes (Signed)
 Robin Maynard, pls contact patient and obtain a symptoms update. Let patient know that Dr. Meridee Score will be her GI. If she is agreeable, pls schedule her for an EGD with Dr. Meridee Score. Pls enter colonoscopy recall date 06/2028. THX.

## 2023-08-10 NOTE — Progress Notes (Signed)
 Dr. Wilhelmenia, Thank you for agreeing to being patient's GI. I will contact patient to schedule EGD. Pls further clarify if colonoscopy to be scheduled at time of EGD. Patient had 2 TA polyps removed per colonoscopy 04/2018 with recommended 5 year recall but ended up having an earlier colonoscopy 06/2021 which was normal, no polyps. Does patient require a colonoscopy at this time.

## 2023-08-10 NOTE — Telephone Encounter (Signed)
 Inbound call from patient, states she was told to follow up today while her last appointment. She states she is still having upper quadrant pain, she states she did take omeprazole  but , could not get liquid Carafate  due to her pharmacy not covering it, she states her pharmacy requested it to be changed to pill form. Would like colleen to know and further advise.

## 2023-08-10 NOTE — Telephone Encounter (Signed)
 Please see note below and advise.  Last OV on 08/07/2023

## 2023-08-10 NOTE — Telephone Encounter (Signed)
 Robin Maynard, there is an earlier cc'd chart message I sent you earlier, just fyi so efforts are not duplicated. Pls contact patient and let her  know that Dr. Wilhelmenia will be her GI. If she is agreeable, pls schedule her for an EGD with Dr. Wilhelmenia. Pls enter colonoscopy recall date 06/2028. THX.

## 2023-08-11 ENCOUNTER — Other Ambulatory Visit (HOSPITAL_COMMUNITY): Payer: Managed Care, Other (non HMO)

## 2023-08-11 ENCOUNTER — Telehealth: Payer: Self-pay

## 2023-08-11 NOTE — Telephone Encounter (Signed)
 Left message for pt to call back

## 2023-08-11 NOTE — Telephone Encounter (Signed)
 Cara Elida HERO, NP  Wonda Standing, RN      Previous Messages  Routed Note  Author: Cara Elida HERO, NP Service: Gastroenterology Author Type: Nurse Practitioner  Filed: 08/10/2023  9:45 AM Encounter Date: 08/07/2023 Status: Signed  Editor: Cara Elida HERO, NP (Nurse Practitioner)   Standing, pls contact patient and obtain a symptoms update. Let patient know that Dr. Wilhelmenia will be her GI. If she is agreeable, pls schedule her for an EGD with Dr. Wilhelmenia. Pls enter colonoscopy recall date 06/2028. THX.     Office Visit for Abdominal Pain 08/07/2023 Cara Elida HERO, NP - [facility] Gastroenterology Diagnosis  Luq Pain (Primary) Orders Signed This Visit  (5) Orders Pended This Visit  None Progress Notes  Mansouraty, Aloha Raddle., MD at 08/07/2023 11:00 AM  Status: Signed   Attending Physician's Attestation    I have reviewed the chart.    I agree with the Advanced Practitioner's note, impression, and recommendations with any updates as below. Agree with moving forward with planned EGD for further evaluation of symptoms.  As patient had earlier colonoscopy follow-up in 2022, can plan to repeat colonoscopy for colon polyp surveillance/adenoma surveillance 7 years from 2022 (may place 2029 colonoscopy recall).     Aloha Wilhelmenia, MD [facility] Advanced [facility]oscopy Office # [phone]

## 2023-08-11 NOTE — Telephone Encounter (Signed)
 Colonoscopy recall placed for 06/2028 Left message for pt to call back

## 2023-08-12 NOTE — Telephone Encounter (Signed)
 Unable to reach pt nor leave a voice mail due to voice mailbox being full at this time.

## 2023-08-13 ENCOUNTER — Other Ambulatory Visit: Payer: Self-pay

## 2023-08-13 DIAGNOSIS — R110 Nausea: Secondary | ICD-10-CM

## 2023-08-13 DIAGNOSIS — R1012 Left upper quadrant pain: Secondary | ICD-10-CM

## 2023-08-13 NOTE — Telephone Encounter (Signed)
 Unable to reach patient, VM box full.

## 2023-08-13 NOTE — Telephone Encounter (Signed)
 Scheduled EGD with patient for 08/18/23 at 10:00 am in the [facility] with Dr. Wilhelmenia. Amb ref placed & instructions sent to patient. Denies any blood thinners/diabetic medications. She will call back to reschedule if needed. Pt verbalized all understanding, no further questions.

## 2023-08-14 ENCOUNTER — Encounter: Payer: Self-pay | Admitting: Gastroenterology

## 2023-08-15 ENCOUNTER — Other Ambulatory Visit (HOSPITAL_COMMUNITY): Payer: Self-pay

## 2023-08-18 ENCOUNTER — Other Ambulatory Visit (HOSPITAL_COMMUNITY): Payer: Self-pay

## 2023-08-18 ENCOUNTER — Ambulatory Visit (AMBULATORY_SURGERY_CENTER): Payer: Managed Care, Other (non HMO) | Admitting: Gastroenterology

## 2023-08-18 ENCOUNTER — Encounter: Payer: Self-pay | Admitting: Gastroenterology

## 2023-08-18 ENCOUNTER — Other Ambulatory Visit: Payer: Self-pay

## 2023-08-18 VITALS — BP 112/70 | HR 68 | Temp 98.2°F | Resp 13 | Ht 64.0 in | Wt 183.0 lb

## 2023-08-18 DIAGNOSIS — K209 Esophagitis, unspecified without bleeding: Secondary | ICD-10-CM

## 2023-08-18 DIAGNOSIS — K449 Diaphragmatic hernia without obstruction or gangrene: Secondary | ICD-10-CM

## 2023-08-18 DIAGNOSIS — K2289 Other specified disease of esophagus: Secondary | ICD-10-CM

## 2023-08-18 DIAGNOSIS — K317 Polyp of stomach and duodenum: Secondary | ICD-10-CM

## 2023-08-18 DIAGNOSIS — R1012 Left upper quadrant pain: Secondary | ICD-10-CM

## 2023-08-18 DIAGNOSIS — K2970 Gastritis, unspecified, without bleeding: Secondary | ICD-10-CM | POA: Diagnosis not present

## 2023-08-18 MED ORDER — SODIUM CHLORIDE 0.9 % IV SOLN: 500.0000 mL | INTRAVENOUS | Status: AC

## 2023-08-18 MED ORDER — ROSUVASTATIN CALCIUM 5 MG PO TABS
5.0000 mg | ORAL_TABLET | Freq: Every day | ORAL | 1 refills | Status: DC
  Filled 2023-08-18: qty 30, 30d supply, fill #0
  Filled 2023-09-18: qty 30, 30d supply, fill #1
  Filled 2023-10-19: qty 30, 30d supply, fill #2
  Filled 2023-11-17: qty 30, 30d supply, fill #3
  Filled 2023-12-14: qty 30, 30d supply, fill #4
  Filled 2024-01-11: qty 30, 30d supply, fill #5

## 2023-08-18 MED ORDER — OMEPRAZOLE 40 MG PO CPDR
40.0000 mg | DELAYED_RELEASE_CAPSULE | Freq: Every day | ORAL | 6 refills | Status: DC
  Filled 2023-08-18: qty 30, 30d supply, fill #0
  Filled 2023-09-10 – 2023-12-08 (×2): qty 30, 30d supply, fill #1
  Filled 2024-04-16 – 2024-04-21 (×2): qty 30, 30d supply, fill #2
  Filled 2024-05-24: qty 30, 30d supply, fill #3

## 2023-08-18 MED ORDER — SODIUM CHLORIDE 0.9 % IV SOLN: 500.0000 mL | Freq: Once | INTRAVENOUS | Status: DC

## 2023-08-18 MED ORDER — SUCRALFATE 1 G PO TABS
1.0000 g | ORAL_TABLET | Freq: Two times a day (BID) | ORAL | 1 refills | Status: AC
  Filled 2023-08-18: qty 60, 30d supply, fill #0
  Filled 2023-09-10: qty 60, 30d supply, fill #1

## 2023-08-18 NOTE — Progress Notes (Signed)
 Called to room to assist during endoscopic procedure.  Patient ID and intended procedure confirmed with present staff. Received instructions for my participation in the procedure from the performing physician.

## 2023-08-18 NOTE — Op Note (Signed)
 [facility] Patient Name: Robin Maynard Procedure Date: 08/18/2023 9:31 AM MRN: [id number] Endoscopist: Aloha Finner , MD, [id number] Age: 58 Referring MD:  Date of Birth: Aug 16, 1965 Gender: Female Account #: [id number] Procedure:                Upper GI endoscopy Indications:              Abdominal pain in the left upper quadrant, Previous                            odynophagia Medicines:                Monitored Anesthesia Care Procedure:                Pre-Anesthesia Assessment:                           - Prior to the procedure, a History and Physical                            was performed, and patient medications and                            allergies were reviewed. The patient's tolerance of                            previous anesthesia was also reviewed. The risks                            and benefits of the procedure and the sedation                            options and risks were discussed with the patient.                            All questions were answered, and informed consent                            was obtained. Prior Anticoagulants: The patient has                            taken no anticoagulant or antiplatelet agents                            except for aspirin . ASA Grade Assessment: II - A                            patient with mild systemic disease. After reviewing                            the risks and benefits, the patient was deemed in                            satisfactory condition to undergo the procedure.  After obtaining informed consent, the endoscope was                            passed under direct vision. Throughout the                            procedure, the patient's blood pressure, pulse, and                            oxygen saturations were monitored continuously. The                            GIF [id number] #[id number] was introduced through the                            mouth, and  advanced to the second part of duodenum.                            The upper GI endoscopy was accomplished without                            difficulty. The patient tolerated the procedure. Scope In: Scope Out: Findings:                 No gross lesions were noted in the proximal                            esophagus and in the mid esophagus.                           LA Grade A (one or more mucosal breaks less than 5                            mm, not extending between tops of 2 mucosal folds)                            esophagitis with no bleeding was found in the                            distal esophagus.                           The Z-line was irregular and was found 34 cm from                            the incisors.                           A 2 cm hiatal hernia was present.                           Multiple small sessile polyps were found in the  gastric body - appearance of fundic gland polyps. 2                            of these polyps were removed with a cold biopsy                            forceps for sampling purposes.                           Patchy moderate inflammation characterized by                            erythema and granularity was found in the entire                            examined stomach. Biopsies were taken with a cold                            forceps for histology and Helicobacter pylori                            testing.                           No gross lesions were noted in the duodenal bulb,                            in the first portion of the duodenum and in the                            second portion of the duodenum. Biopsies were taken                            with a cold forceps for histology. Complications:            No immediate complications. Estimated Blood Loss:     Estimated blood loss was minimal. Impression:               - No gross lesions in the proximal esophagus and in                             the mid esophagus.                           - LA Grade A esophagitis with no bleeding found                            distally.                           - Z-line irregular, 34 cm from the incisors.                           - 2 cm hiatal hernia.                           -  Multiple gastric polyps - appearance of fundic                            gland polyps. 2 were resected and retrieved.                           - Gastritis. Biopsied.                           - No gross lesions in the duodenal bulb, in the                            first portion of the duodenum and in the second                            portion of the duodenum. Biopsied. Recommendation:           - The patient will be observed post-procedure,                            until all discharge criteria are met.                           - Discharge patient to home.                           - Patient has a contact number available for                            emergencies. The signs and symptoms of potential                            delayed complications were discussed with the                            patient. Return to normal activities tomorrow.                            Written discharge instructions were provided to the                            patient.                           - Resume previous diet.                           - Increase omeprazole  to 40 mg daily.                           - If patient would like Carafate  refilled, we can                            give that 1 g twice daily.                           -  Continue present medications.                           - Await pathology results. If H. pylori is found,                            will require treatment.                           - Repeat endoscopy to ensure healing of                            esophagitis, not completely clear if needed but                            could consider in 4 months versus just monitoring                             symptomatically.                           - The findings and recommendations were discussed                            with the patient.                           - The findings and recommendations were discussed                            with the patient's family. Aloha Finner, MD 08/18/2023 9:52:56 AM

## 2023-08-18 NOTE — Progress Notes (Signed)
 Sedate, gd SR, tolerated procedure well, VSS, report to RN

## 2023-08-18 NOTE — Progress Notes (Signed)
 GASTROENTEROLOGY PROCEDURE H&P NOTE   Primary Care Physician: Rolinda Millman, MD  HPI: Robin Maynard is a 58 y.o. female who presents for EGD for evaluation of odynophagia and left upper quadrant abdominal discomfort.  Past Medical History:  Diagnosis Date   Acute pancreatitis    Autoimmune thyroiditis    Constipation    Diverticulitis    Gastritis    GERD (gastroesophageal reflux disease)    GI work up in progress   Hashimoto's thyroiditis    Headache    Heartburn    Herpes    Joint pain    Mass on back    Obesity    Pancreatic disease    PONV (postoperative nausea and vomiting)    Rheumatoid arthritis (HCC)    SOBOE (shortness of breath on exertion)    Thyroid  condition    Vitamin D  deficiency    Past Surgical History:  Procedure Laterality Date   COLONOSCOPY     DILITATION & CURRETTAGE/HYSTROSCOPY WITH HYDROTHERMAL ABLATION N/A 12/26/2016   Procedure: DILATATION & CURETTAGE/HYSTEROSCOPY WITH HYDROTHERMAL ABLATION;  Surgeon: Mat Browning, MD;  [facility];  Service: Gynecology;  Laterality: N/A;   MASS EXCISION Right 02/23/2018   Procedure: EXCISION BACK MASS ERAS PATHWAY;  Surgeon: Ebbie Cough, MD;  Location: [facility];  Service: General;  Laterality: Right;   POLYPECTOMY     TONSILLECTOMY AND ADENOIDECTOMY     TYMPANOSTOMY TUBE PLACEMENT     Current Outpatient Medications  Medication Sig Dispense Refill   aspirin  EC (ASPIRIN  81) 81 MG tablet Take 1 tablet by mouth once daily 30 tablet 0   celecoxib  (CELEBREX ) 50 MG capsule Take 2 capsules (100 mg total) by mouth 2 (two)  times daily as needed for Pain. 60 capsule 1   celecoxib  (CELEBREX ) 50 MG capsule Take 2 capsules (100 mg total) by mouth 2 (two) times daily as needed for pain 60 capsule 1   Cholecalciferol (VITAMIN D3) 50 MCG (2000 UT) capsule Take 1 capsule (2,000 Units total) by mouth daily.     Fremanezumab -vfrm (AJOVY ) 225 MG/1.5ML SOAJ Inject 225  mg into the skin every 30 (thirty) days. 1.5 mL 2   Magnesium  Gluconate 550 MG TABS Take 550 mg by mouth. 2 to 3 times a week     Multiple Vitamin (MULTI-VITAMIN) tablet Take 1 tablet by mouth daily.     nirmatrelvir  & ritonavir  (PAXLOVID , 300/100,) 20 x 150 MG & 10 x 100MG  TBPK Take 3 tablets by mouth 2 (two) times daily. 30 tablet 0   Omega-3 1000 MG CAPS Take by mouth.     omeprazole  (PRILOSEC) 20 MG capsule Take 1 capsule (20 mg total) by mouth daily. Take 30 minutes before breakfast. 30 capsule 0   rosuvastatin  (CRESTOR ) 5 MG tablet Take 1 tablet (5 mg total) by mouth daily. 90 tablet 0   sucralfate  (CARAFATE ) 1 GM/10ML suspension Take 10 mLs (1 g total) by mouth 2 (two) times daily for 10 days. Do not take within 2-4 hours of any other medications. 200 mL 0   Turmeric 400 MG CAPS See admin instructions.     valACYclovir  (VALTREX ) 1000 MG tablet Take 1 tablet (1,000 mg total) by mouth 2 (two) times daily. 180 tablet 3   zolpidem  (AMBIEN ) 10 MG tablet Take 1 tablet (10 mg total) by mouth at bedtime. 30 tablet 3   No current facility-administered medications for this visit.    Current Outpatient Medications:    aspirin  EC (ASPIRIN   81) 81 MG tablet, Take 1 tablet by mouth once daily, Disp: 30 tablet, Rfl: 0   celecoxib  (CELEBREX ) 50 MG capsule, Take 2 capsules (100 mg total) by mouth 2 (two)  times daily as needed for Pain., Disp: 60 capsule, Rfl: 1   celecoxib  (CELEBREX ) 50 MG capsule, Take 2 capsules (100 mg total) by mouth 2 (two) times daily as needed for pain, Disp: 60 capsule, Rfl: 1   Cholecalciferol (VITAMIN D3) 50 MCG (2000 UT) capsule, Take 1 capsule (2,000 Units total) by mouth daily., Disp: , Rfl:    Fremanezumab -vfrm (AJOVY ) 225 MG/1.5ML SOAJ, Inject 225 mg into the skin every 30 (thirty) days., Disp: 1.5 mL, Rfl: 2   Magnesium  Gluconate 550 MG TABS, Take 550 mg by mouth. 2 to 3 times a week, Disp: , Rfl:    Multiple Vitamin (MULTI-VITAMIN) tablet, Take 1 tablet by mouth daily.,  Disp: , Rfl:    nirmatrelvir  & ritonavir  (PAXLOVID , 300/100,) 20 x 150 MG & 10 x 100MG  TBPK, Take 3 tablets by mouth 2 (two) times daily., Disp: 30 tablet, Rfl: 0   Omega-3 1000 MG CAPS, Take by mouth., Disp: , Rfl:    omeprazole  (PRILOSEC) 20 MG capsule, Take 1 capsule (20 mg total) by mouth daily. Take 30 minutes before breakfast., Disp: 30 capsule, Rfl: 0   rosuvastatin  (CRESTOR ) 5 MG tablet, Take 1 tablet (5 mg total) by mouth daily., Disp: 90 tablet, Rfl: 0   sucralfate  (CARAFATE ) 1 GM/10ML suspension, Take 10 mLs (1 g total) by mouth 2 (two) times daily for 10 days. Do not take within 2-4 hours of any other medications., Disp: 200 mL, Rfl: 0   Turmeric 400 MG CAPS, See admin instructions., Disp: , Rfl:    valACYclovir  (VALTREX ) 1000 MG tablet, Take 1 tablet (1,000 mg total) by mouth 2 (two) times daily., Disp: 180 tablet, Rfl: 3   zolpidem  (AMBIEN ) 10 MG tablet, Take 1 tablet (10 mg total) by mouth at bedtime., Disp: 30 tablet, Rfl: 3 Allergies  Allergen Reactions   Doxycycline Nausea And Vomiting   Flulaval Quadrivalent [Influenza Vac Split Quad] Other (See Comments)    Weakness and don't feel good from the vaccine   Hydroxychloroquine Other (See Comments)    Nausea/malaise/reflux    Family History  Problem Relation Age of Onset   Deep vein thrombosis Mother    Colon polyps Mother    Heart disease Mother    Depression Mother    Anxiety disorder Mother    Schizophrenia Mother    Obesity Mother    Cancer Father    Hyperlipidemia Father    Gastric cancer Father    Diabetes Father    Heart disease Father    Hypertension Father    Stomach cancer Father    Diabetes Paternal Grandmother    Heart disease Paternal Grandmother    Aortic stenosis Son    Breast cancer Maternal Aunt    Colon cancer Neg Hx    Allergic rhinitis Neg Hx    Asthma Neg Hx    Angioedema Neg Hx    Eczema Neg Hx    Urticaria Neg Hx    Rectal cancer Neg Hx    Esophageal cancer Neg Hx    Social History    Socioeconomic History   Marital status: Married    Spouse name: Alm   Number of children: Not on file   Years of education: Not on file   Highest education level: Not on file  Occupational History  Occupation: SCIENTIST, CLINICAL (HISTOCOMPATIBILITY AND IMMUNOGENETICS)  Tobacco Use   Smoking status: Never    Passive exposure: Current   Smokeless tobacco: Never  Vaping Use   Vaping status: Never Used  Substance and Sexual Activity   Alcohol  use: No   Drug use: No   Sexual activity: Not on file    Comment: ablation  Other Topics Concern   Not on file  Social History Narrative   Married since 1985.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   Social Drivers of Health   Financial Resource Strain: Not on file  Food Insecurity: Low Risk  (06/09/2023)   Received from [facility]   Hunger Vital Sign    Worried About Running Out of Food in the Last Year: Never true    Ran Out of Food in the Last Year: Never true  Transportation Needs: No Transportation Needs (06/09/2023)   Received from [facility]    In the past 12 months, has lack of reliable transportation kept you from medical appointments, meetings, work or from getting things needed for daily living? : No  Physical Activity: Not on file  Stress: Not on file  Social Connections: Unknown (05/06/2023)   Received from [facility]   Social Network    Social Network: Not on file  Intimate Partner Violence: Unknown (05/06/2023)   Received from [facility]   HITS    Physically Hurt: Not on file    Insult or Talk Down To: Not on file    Threaten Physical Harm: Not on file    Scream or Curse: Not on file    Physical Exam: Today's Vitals   08/18/23 0912 08/18/23 0914  BP: 135/75   Pulse: 81   Temp:  98.2 F (36.8 C)  SpO2: 97%   Weight: 183 lb (83 kg)   Height: 5' 4 (1.626 m)   PainSc:  1    Body mass index is 31.41 kg/m. GEN: NAD EYE: Sclerae anicteric ENT: MMM CV: Non-tachycardic GI: Soft, NT/ND NEURO:  Alert & Oriented x 3  Lab  Results: No results for input(s): WBC, HGB, HCT, PLT in the last 72 hours. BMET No results for input(s): NA, K, CL, CO2, GLUCOSE, BUN, CREATININE, CALCIUM  in the last 72 hours. LFT No results for input(s): PROT, ALBUMIN, AST, ALT, ALKPHOS, BILITOT, BILIDIR, IBILI in the last 72 hours. PT/INR No results for input(s): LABPROT, INR in the last 72 hours.   Impression / Plan: This is a 58 y.o.female who presents for EGD for evaluation of odynophagia and left upper quadrant abdominal discomfort.  The risks and benefits of endoscopic evaluation/treatment were discussed with the patient and/or family; these include but are not limited to the risk of perforation, infection, bleeding, missed lesions, lack of diagnosis, severe illness requiring hospitalization, as well as anesthesia and sedation related illnesses.  The patient's history has been reviewed, patient examined, no change in status, and deemed stable for procedure.  The patient and/or family is agreeable to proceed.    Aloha Finner, MD [facility] Advanced Endoscopy Office # [phone]

## 2023-08-18 NOTE — Progress Notes (Signed)
 Pt's states no medical or surgical changes since previsit or office visit.

## 2023-08-18 NOTE — Patient Instructions (Signed)
 Please read handouts provided. Resume previous diet. Continue present medications. Await pathology results. Increase omeprazole  to 40 mg daily. Carafate  1 gram tablet twice daily.   YOU HAD AN ENDOSCOPIC PROCEDURE TODAY AT THE [facility]:   Refer to the procedure report that was given to you for any specific questions about what was found during the examination.  If the procedure report does not answer your questions, please call your gastroenterologist to clarify.  If you requested that your care partner not be given the details of your procedure findings, then the procedure report has been included in a sealed envelope for you to review at your convenience later.  YOU SHOULD EXPECT: Some feelings of bloating in the abdomen. Passage of more gas than usual.  Walking can help get rid of the air that was put into your GI tract during the procedure and reduce the bloating. If you had a lower endoscopy (such as a colonoscopy or flexible sigmoidoscopy) you may notice spotting of blood in your stool or on the toilet paper. If you underwent a bowel prep for your procedure, you may not have a normal bowel movement for a few days.  Please Note:  You might notice some irritation and congestion in your nose or some drainage.  This is from the oxygen used during your procedure.  There is no need for concern and it should clear up in a day or so.  SYMPTOMS TO REPORT IMMEDIATELY:  Following upper endoscopy (EGD)  Vomiting of blood or coffee ground material  New chest pain or pain under the shoulder blades  Painful or persistently difficult swallowing  New shortness of breath  Fever of 100F or higher  Black, tarry-looking stools  For urgent or emergent issues, a gastroenterologist can be reached at any hour by calling [phone]. Do not use MyChart messaging for urgent concerns.    DIET:  We do recommend a small meal at first, but then you may proceed to your regular diet.  Drink  plenty of fluids but you should avoid alcoholic beverages for 24 hours.  ACTIVITY:  You should plan to take it easy for the rest of today and you should NOT DRIVE or use heavy machinery until tomorrow (because of the sedation medicines used during the test).    FOLLOW UP: Our staff will call the number listed on your records the next business day following your procedure.  We will call around 7:15- 8:00 am to check on you and address any questions or concerns that you may have regarding the information given to you following your procedure. If we do not reach you, we will leave a message.     If any biopsies were taken you will be contacted by phone or by letter within the next 1-3 weeks.  Please call us  at [phone] if you have not heard about the biopsies in 3 weeks.    SIGNATURES/CONFIDENTIALITY: You and/or your care partner have signed paperwork which will be entered into your electronic medical record.  These signatures attest to the fact that that the information above on your After Visit Summary has been reviewed and is understood.  Full responsibility of the confidentiality of this discharge information lies with you and/or your care-partner.

## 2023-08-19 ENCOUNTER — Telehealth: Payer: Self-pay

## 2023-08-19 NOTE — Telephone Encounter (Signed)
 Unable to leave message mailbox full.

## 2023-08-20 ENCOUNTER — Encounter: Payer: Self-pay | Admitting: Gastroenterology

## 2023-08-20 ENCOUNTER — Encounter (HOSPITAL_COMMUNITY)
Admission: RE | Admit: 2023-08-20 | Discharge: 2023-08-20 | Disposition: A | Discharge status: Home / Self Care | Payer: Managed Care, Other (non HMO) | Source: Ambulatory Visit | Attending: Neurology | Admitting: Neurology

## 2023-08-20 DIAGNOSIS — G20C Parkinsonism, unspecified: Secondary | ICD-10-CM | POA: Diagnosis present

## 2023-08-20 DIAGNOSIS — R430 Anosmia: Secondary | ICD-10-CM | POA: Diagnosis present

## 2023-08-20 DIAGNOSIS — R2689 Other abnormalities of gait and mobility: Secondary | ICD-10-CM | POA: Insufficient documentation

## 2023-08-20 DIAGNOSIS — R42 Dizziness and giddiness: Secondary | ICD-10-CM | POA: Insufficient documentation

## 2023-08-20 LAB — SURGICAL PATHOLOGY

## 2023-08-20 MED ORDER — POTASSIUM IODIDE (ANTIDOTE) 130 MG PO TABS
ORAL_TABLET | ORAL | Status: AC
  Filled 2023-08-20: qty 1

## 2023-08-20 MED ORDER — IOFLUPANE I 123 185 MBQ/2.5ML IV SOLN
4.6000 | Freq: Once | INTRAVENOUS | Status: AC | PRN
  Administered 2023-08-20: 4.6 via INTRAVENOUS

## 2023-08-20 MED ORDER — POTASSIUM IODIDE (ANTIDOTE) 130 MG PO TABS
130.0000 mg | ORAL_TABLET | Freq: Once | ORAL | Status: AC
  Administered 2023-08-20: 130 mg via ORAL

## 2023-08-24 ENCOUNTER — Encounter: Payer: Self-pay | Admitting: Neurology

## 2023-08-25 ENCOUNTER — Encounter: Payer: Self-pay | Admitting: Neurology

## 2023-08-26 ENCOUNTER — Other Ambulatory Visit (HOSPITAL_COMMUNITY): Payer: Self-pay

## 2023-09-01 ENCOUNTER — Encounter: Payer: Self-pay | Admitting: Gastroenterology

## 2023-09-01 ENCOUNTER — Other Ambulatory Visit (HOSPITAL_COMMUNITY): Payer: Self-pay

## 2023-09-01 MED ORDER — PREDNISONE 20 MG PO TABS
20.0000 mg | ORAL_TABLET | Freq: Two times a day (BID) | ORAL | 0 refills | Status: DC
  Filled 2023-09-01: qty 10, 5d supply, fill #0

## 2023-09-01 MED ORDER — ALBUTEROL SULFATE HFA 108 (90 BASE) MCG/ACT IN AERS
INHALATION_SPRAY | RESPIRATORY_TRACT | 0 refills | Status: AC
  Filled 2023-09-01: qty 6.7, 25d supply, fill #0

## 2023-09-10 ENCOUNTER — Other Ambulatory Visit: Payer: Self-pay

## 2023-09-15 ENCOUNTER — Other Ambulatory Visit (HOSPITAL_COMMUNITY): Payer: Self-pay

## 2023-09-15 ENCOUNTER — Other Ambulatory Visit: Payer: Self-pay

## 2023-09-15 MED ORDER — AMOXICILLIN-POT CLAVULANATE 875-125 MG PO TABS
1.0000 | ORAL_TABLET | Freq: Two times a day (BID) | ORAL | 0 refills | Status: DC
  Filled 2023-09-15 (×2): qty 20, 10d supply, fill #0

## 2023-09-15 MED ORDER — VALACYCLOVIR HCL 1 G PO TABS
1000.0000 mg | ORAL_TABLET | Freq: Two times a day (BID) | ORAL | 3 refills | Status: AC
Start: 2023-09-15 — End: ?
  Filled 2023-09-15: qty 60, 30d supply, fill #0
  Filled 2023-11-05: qty 60, 30d supply, fill #1
  Filled 2023-12-08: qty 60, 30d supply, fill #2
  Filled 2024-01-02 – 2024-01-29 (×3): qty 60, 30d supply, fill #3
  Filled 2024-02-23: qty 60, 30d supply, fill #4
  Filled 2024-03-23: qty 60, 30d supply, fill #5
  Filled 2024-04-16 – 2024-04-18 (×2): qty 60, 30d supply, fill #6
  Filled 2024-05-24: qty 60, 30d supply, fill #7

## 2023-09-18 ENCOUNTER — Other Ambulatory Visit (HOSPITAL_COMMUNITY): Payer: Self-pay

## 2023-09-19 ENCOUNTER — Other Ambulatory Visit (HOSPITAL_COMMUNITY): Payer: Self-pay

## 2023-09-24 ENCOUNTER — Ambulatory Visit: Payer: Managed Care, Other (non HMO) | Admitting: Neurology

## 2023-10-19 ENCOUNTER — Other Ambulatory Visit: Payer: Self-pay

## 2023-10-19 NOTE — Progress Notes (Unsigned)
 [facility]    Provider:  Dr Lucia Gaskins Requesting Provider: Aliene Beams, MD Primary Care Provider:  Aliene Beams, MD  CC:  dizzines and imbalance with lesions in the mouth  HPI:  Robin Maynard is a 58 y.o. female here as requested by Aliene Beams, MD for imbalance. No dizziness. has Mass of right lower leg; Mass on back; Acute pancreatitis; Constipation; Overweight (BMI 25.0-29.9); Abdominal pain, epigastric; Closed fracture of right clavicle; Other hyperlipidemia; Vitamin D deficiency; Pre-diabetes; Rheumatoid arthritis (HCC); Hypercholesterolemia; Prediabetes; Hashimoto's thyroiditis; and H/o Low serum cortisol level on their problem list.  She lost her sense of smell years ago pre-covid. She cannot smell things, sometimes can catch a whiff. She woke up 3-4 years ago grabbing the walls for balance. Was not room spinning or dizziness. She hs a constant pain on the right temple, dull ache ache, pulsating, She has a hx of migraines. She does not have room spinning or an internal feeling of moevin but she has such bad balance she had to hold the walls, no lightheadedness, no CP, no pre-syncope, she went to ENT and she had a hearing test and a CT test found 50% carotid stenosis she followed up with vascular and is following. She sees Rhaumatologist and she has been the driving force for getting answers, rhaumatology may thik she has an inflammatory joint disease but they have never swabbed the lesions she gets. She is positive for HSV1. She has breakthroughs of lesions in the mouth, never enough to culture anything, valtrex has helped still get sores but not open sores more like little rashes in her mouth and won;t even scab but used to scab, valtrex 1-2x a day every day. She has balance issues a lot, worse with the lesions, she is a Engineer, civil (consulting). The pain on the right side of the head is worse with imbalance. Imbalance is daily and not just with the imbalance. Imbalance does not get  better with the valtrex of decreased lesions. Gets the lesions with sun exposure and stress, symptoms better in the winter. The outbreak was on the face and she had a crazy pain radiating from the left nose to the temple. Her migraines are on the right, light sensitivity, nausea, pulsating/pounding/throbbing/unilateral on the right. Upper right quadrant vision loss.   Reviewed notes, labs and imaging from outside physicians, which showed:  02/17/2021: CLINICAL DATA:  Dizziness   EXAM: MRI HEAD WITHOUT CONTRAST   TECHNIQUE: Multiplanar, multiecho pulse sequences of the brain and surrounding structures were obtained without intravenous contrast.   COMPARISON:  CT head 02/17/2021   FINDINGS: Brain: No acute infarction, hemorrhage, hydrocephalus, extra-axial collection or mass lesion. Normal cerebral white matter.   Vascular: Normal arterial flow voids   Skull and upper cervical spine: Negative   Sinuses/Orbits: Paranasal sinuses clear.  Negative orbit   Other: None   IMPRESSION: Negative MRI head without contrast.     Latest Ref Rng & Units 08/07/2023   11:48 AM 02/17/2021    1:35 PM 01/26/2019    9:09 AM  CMP  Glucose 70 - 99 mg/dL 409  811  914   BUN 6 - 23 mg/dL 20  14  24    Creatinine 0.40 - 1.20 mg/dL 7.82  9.56  2.13   Sodium 135 - 145 mEq/L 140  137  139   Potassium 3.5 - 5.1 mEq/L 4.3  4.0  4.3   Chloride 96 - 112 mEq/L 105  105  108   CO2 19 - 32  mEq/L 25  25  26    Calcium 8.4 - 10.5 mg/dL 9.7  9.1  9.8   Total Protein 6.0 - 8.3 g/dL 7.0   7.0    6.7   Total Bilirubin 0.2 - 1.2 mg/dL 0.9   0.6   Alkaline Phos 39 - 117 U/L 78     AST 0 - 37 U/L 19   16   ALT 0 - 35 U/L 30   24       Latest Ref Rng & Units 08/07/2023   11:48 AM 02/17/2021    1:35 PM 01/26/2019    9:09 AM  CBC  WBC 4.0 - 10.5 K/uL 8.1  6.0  6.9   Hemoglobin 12.0 - 15.0 g/dL 16.1  09.6  04.5   Hematocrit 36.0 - 46.0 % 46.0  43.5  43.9   Platelets 150.0 - 400.0 K/uL 290.0  241  264    Recent  Results (from the past 2160 hours)  Lipase     Status: None   Collection Time: 08/07/23 11:48 AM  Result Value Ref Range   Lipase 34.0 11.0 - 59.0 U/L  Comp Met (CMET)     Status: Abnormal   Collection Time: 08/07/23 11:48 AM  Result Value Ref Range   Sodium 140 135 - 145 mEq/L   Potassium 4.3 3.5 - 5.1 mEq/L   Chloride 105 96 - 112 mEq/L   CO2 25 19 - 32 mEq/L   Glucose, Bld 100 (H) 70 - 99 mg/dL   BUN 20 6 - 23 mg/dL   Creatinine, Ser 4.09 0.40 - 1.20 mg/dL   Total Bilirubin 0.9 0.2 - 1.2 mg/dL   Alkaline Phosphatase 78 39 - 117 U/L   AST 19 0 - 37 U/L   ALT 30 0 - 35 U/L   Total Protein 7.0 6.0 - 8.3 g/dL   Albumin 4.7 3.5 - 5.2 g/dL   GFR 81.19 >14.78 mL/min    Comment: Calculated using the CKD-EPI Creatinine Equation (2021)   Calcium 9.7 8.4 - 10.5 mg/dL  CBC w/Diff     Status: Abnormal   Collection Time: 08/07/23 11:48 AM  Result Value Ref Range   WBC 8.1 4.0 - 10.5 K/uL   RBC 4.77 3.87 - 5.11 Mil/uL   Hemoglobin 15.3 (H) 12.0 - 15.0 g/dL   HCT 29.5 62.1 - 30.8 %   MCV 96.3 78.0 - 100.0 fl   MCHC 33.3 30.0 - 36.0 g/dL   RDW 65.7 84.6 - 96.2 %   Platelets 290.0 150.0 - 400.0 K/uL   Neutrophils Relative % 69.1 43.0 - 77.0 %   Lymphocytes Relative 23.3 12.0 - 46.0 %   Monocytes Relative 5.6 3.0 - 12.0 %   Eosinophils Relative 1.4 0.0 - 5.0 %   Basophils Relative 0.6 0.0 - 3.0 %   Neutro Abs 5.6 1.4 - 7.7 K/uL   Lymphs Abs 1.9 0.7 - 4.0 K/uL   Monocytes Absolute 0.5 0.1 - 1.0 K/uL   Eosinophils Absolute 0.1 0.0 - 0.7 K/uL   Basophils Absolute 0.1 0.0 - 0.1 K/uL  Surgical pathology     Status: None   Collection Time: 08/18/23 12:00 AM  Result Value Ref Range   SURGICAL PATHOLOGY      SURGICAL PATHOLOGY [facility] [address], [address] [place], [place] [address] Telephone [phone] or [phone] Fax [phone]  REPORT OF SURGICAL PATHOLOGY   Accession #: [id number] Patient Name: Robin Maynard Visit # :  [id number]  MRN: [id number] Physician: Corliss Parish DOB/Age [birth year] (Age: 58) Gender: F Collected Date: 08/18/2023 Received Date: 08/19/2023  FINAL DIAGNOSIS       1. Surgical [P], gastric :       -  ANTRAL AND OXYNTIC MUCOSA WITH NO SIGNIFICANT PATHOLOGY.      -  NO HELICOBACTER PYLORI ORGANISMS IDENTIFIED ON H&E STAINED SLIDE.       2. Surgical [P], gastric polyps :       -  FUNDIC GLAND POLYPS.       3. Surgical [P], duodena :       -  DUODENAL MUCOSA WITH PROMINENT BRUNNER'S GLANDS, OTHERWISE NO SIGNIFICANT      PATHOLOGY.       ELECTRONIC SIGNATURE : Barkley Boards, Pathologist, Electronic Signature  MICROSCOPIC DESCRIPTION  CASE COMMENTS STAINS USED IN DIAGNOSIS: H&E H&E  H&E    CLINICAL HISTORY  SPECIMEN(S) OBTAINED 1. Surgical [P], Gastric 2. Surgical [P], Gastric Polyps 3. Surgical [P], Duodena  SPECIMEN COMMENTS: 1. LUQ pain SPECIMEN CLINICAL INFORMATION: 1. R/O H.pylori 2. R/O other adenoma 3. R/O other enteropathy    Gross Description 1. Received in formalin are tan, soft tissue fragments that are submitted in toto.Number: 5, Size: 0.2 cm smallest to 0.6 cm largest, (1B) ( TA ) 2. Received in formalin are tan, soft tissue fragments that are submitted in toto.Number: 2 Size: 0.3 cm, (1B) ( TA ) 3. Received in formalin are tan, soft tissue fragments that are submitted in toto.Number: 4, Size: 0.2 cm smallest to 0.5 cm largest, (1B) ( TA )        Report signed out from the following location(s) [facility] [address], [place] [address] CLIA #: [id number]  [facility] [address] [place], [place] [address] CLIA #: [id number]      Review of Systems: Patient complains of symptoms per HPI as well as the following symptoms none. Pertinent negatives and positives per HPI. All others negative.   Social History   Socioeconomic History   Marital status: Married    Spouse  name: Onalee Hua   Number of children: Not on file   Years of education: Not on file   Highest education level: Not on file  Occupational History   Occupation: CRNA  Tobacco Use   Smoking status: Never    Passive exposure: Current   Smokeless tobacco: Never  Vaping Use   Vaping status: Never Used  Substance and Sexual Activity   Alcohol use: No   Drug use: No   Sexual activity: Not on file    Comment: ablation  Other Topics Concern   Not on file  Social History Narrative   Married since 1985.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   Social Drivers of Health   Financial Resource Strain: Not on file  Food Insecurity: Low Risk  (06/09/2023)   Received from [facility]   Hunger Vital Sign    Worried About Running Out of Food in the Last Year: Never true    Ran Out of Food in the Last Year: Never true  Transportation Needs: No Transportation Needs (06/09/2023)   Received from [facility]    In the past 12 months, has lack of reliable transportation kept you from medical appointments, meetings, work or from getting things needed for daily living? : No  Physical Activity: Not on file  Stress: Not on file  Social Connections: Unknown (05/06/2023)   Received from  [facility]   Social Network    Social Network: Not on file  Intimate Partner Violence: Unknown (05/06/2023)   Received from [facility]   HITS    Physically Hurt: Not on file    Insult or Talk Down To: Not on file    Threaten Physical Harm: Not on file    Scream or Curse: Not on file    Family History  Problem Relation Age of Onset   Deep vein thrombosis Mother    Colon polyps Mother    Heart disease Mother    Depression Mother    Anxiety disorder Mother    Schizophrenia Mother    Obesity Mother    Cancer Father    Hyperlipidemia Father    Gastric cancer Father    Diabetes Father    Heart disease Father    Hypertension Father    Stomach cancer Father    Diabetes Paternal Grandmother     Heart disease Paternal Grandmother    Aortic stenosis Son    Breast cancer Maternal Aunt    Colon cancer Neg Hx    Allergic rhinitis Neg Hx    Asthma Neg Hx    Angioedema Neg Hx    Eczema Neg Hx    Urticaria Neg Hx    Rectal cancer Neg Hx    Esophageal cancer Neg Hx     Past Medical History:  Diagnosis Date   Acute pancreatitis    Autoimmune thyroiditis    Constipation    Diverticulitis    Gastritis    GERD (gastroesophageal reflux disease)    GI work up in progress   Hashimoto's thyroiditis    Headache    Heartburn    Herpes    Joint pain    Mass on back    Obesity    Pancreatic disease    PONV (postoperative nausea and vomiting)    Rheumatoid arthritis (HCC)    SOBOE (shortness of breath on exertion)    Thyroid condition    Vitamin D deficiency     Patient Active Problem List   Diagnosis Date Noted   Hashimoto's thyroiditis 07/07/2022   H/o Low serum cortisol level 07/07/2022   Prediabetes 06/30/2022   Hypercholesterolemia 05/15/2022   Other hyperlipidemia 04/15/2022   Vitamin D deficiency 04/15/2022   Pre-diabetes 04/15/2022   Rheumatoid arthritis (HCC) 04/15/2022   Closed fracture of right clavicle 04/30/2019   Abdominal pain, epigastric 09/30/2016   Acute pancreatitis 09/27/2016   Constipation 09/27/2016   Overweight (BMI 25.0-29.9) 09/27/2016   Mass of right lower leg 08/11/2013   Mass on back 08/11/2013    Past Surgical History:  Procedure Laterality Date   COLONOSCOPY     DILITATION & CURRETTAGE/HYSTROSCOPY WITH HYDROTHERMAL ABLATION N/A 12/26/2016   Procedure: DILATATION & CURETTAGE/HYSTEROSCOPY WITH HYDROTHERMAL ABLATION;  Surgeon: Marcelle Overlie, MD;  Location: [facility];  Service: Gynecology;  Laterality: N/A;   MASS EXCISION Right 02/23/2018   Procedure: EXCISION BACK MASS ERAS PATHWAY;  Surgeon: Emelia Loron, MD;  Location: [facility];  Service: General;  Laterality: Right;   POLYPECTOMY      TONSILLECTOMY AND ADENOIDECTOMY     TYMPANOSTOMY TUBE PLACEMENT      Current Outpatient Medications  Medication Sig Dispense Refill   albuterol (VENTOLIN HFA) 108 (90 Base) MCG/ACT inhaler Inhale 2 puffs every 6 (six) hours as needed for wheezing. 6.7 g 0   amoxicillin-clavulanate (AUGMENTIN) 875-125 MG tablet Take 1 tablet by mouth 2 (two) times daily  for 10 days. 20 tablet 0   aspirin EC (ASPIRIN 81) 81 MG tablet Take 1 tablet by mouth once daily 30 tablet 0   celecoxib (CELEBREX) 50 MG capsule Take 2 capsules (100 mg total) by mouth 2 (two)  times daily as needed for Pain. 60 capsule 1   Cholecalciferol (VITAMIN D3) 50 MCG (2000 UT) capsule Take 1 capsule (2,000 Units total) by mouth daily.     Fremanezumab-vfrm (AJOVY) 225 MG/1.5ML SOAJ Inject 225 mg into the skin every 30 (thirty) days. 1.5 mL 2   Magnesium Gluconate 550 MG TABS Take 550 mg by mouth. 2 to 3 times a week     Multiple Vitamin (MULTI-VITAMIN) tablet Take 1 tablet by mouth daily.     nirmatrelvir & ritonavir (PAXLOVID, 300/100,) 20 x 150 MG & 10 x 100MG  TBPK Take 3 tablets by mouth 2 (two) times daily. 30 tablet 0   Omega-3 1000 MG CAPS Take by mouth.     omeprazole (PRILOSEC) 40 MG capsule Take 1 capsule (40 mg total) by mouth daily. 30 capsule 6   predniSONE (DELTASONE) 20 MG tablet Take 2 tablets (40 mg total) by mouth daily for 5 days. 10 tablet 0   rosuvastatin (CRESTOR) 5 MG tablet Take 1 tablet (5 mg total) by mouth daily. 90 tablet 1   sucralfate (CARAFATE) 1 g tablet Take 1 tablet (1 g total) by mouth 2 (two) times daily. 60 tablet 1   Turmeric 400 MG CAPS See admin instructions.     valACYclovir (VALTREX) 1000 MG tablet Take 1 tablet (1,000 mg total) by mouth 2 (two) times daily. 180 tablet 3   valACYclovir (VALTREX) 1000 MG tablet Take 1 tablet (1,000 mg total) by mouth 2 (two) times daily. 180 tablet 3   zolpidem (AMBIEN) 10 MG tablet Take 1 tablet (10 mg total) by mouth at bedtime. 30 tablet 3   No current  facility-administered medications for this visit.    Allergies as of 10/20/2023 - Review Complete 08/18/2023  Allergen Reaction Noted   Doxycycline Nausea And Vomiting 06/21/2019   Flulaval quadrivalent [influenza vac split quad] Other (See Comments) 10/31/2019   Hydroxychloroquine Other (See Comments) 08/16/2020    Vitals: There were no vitals taken for this visit. Last Weight:  Wt Readings from Last 1 Encounters:  08/18/23 183 lb (83 kg)   Last Height:   Ht Readings from Last 1 Encounters:  08/18/23 5\' 4"  (1.626 m)     Physical exam: Exam: Gen: NAD, conversant, well nourised, obese, well groomed                     CV: RRR, no MRG. No Carotid Bruits. No peripheral edema, warm, nontender Eyes: Conjunctivae clear without exudates or hemorrhage  Neuro: Detailed Neurologic Exam  Speech:    Speech is normal; fluent and spontaneous with normal comprehension.  Cognition:    The patient is oriented to person, place, and time;     recent and remote memory intact;     language fluent;     normal attention, concentration,     fund of knowledge Cranial Nerves:    The pupils are equal, round, and reactive to light. The fundi are normal and spontaneous venous pulsations are present. Visual fields are full to finger confrontation. Extraocular movements are intact. Trigeminal sensation is intact and the muscles of mastication are normal. The face is symmetric. The palate elevates in the midline. Hearing intact. Voice is normal. Shoulder shrug is normal.  The tongue has normal motion without fasciculations.   Coordination:    Normal finger to nose and heel to shin.   Gait:    Heel-toe and tandem gait with imbalance. Decreased arm swing  Motor Observation:    No asymmetry, no atrophy, and no involuntary movements noted. Tone:    Normal muscle tone.    Posture:    Posture is normal. normal erect    Strength:    Strength is V/V in the upper and lower limbs.      Sensation:  intact to LT     Reflex Exam:  DTR's: left arm reflexes hypo compared to right.     Deep tendon reflexes in the upper and lower extremities are normal bilaterally.   Toes:    The toes are downgoing bilaterally.   Clonus:    Clonus is absent.    Assessment/Plan:  Dizziness and imbalance and falls, vertigo, hearing lossoften when she has lesions in the mouth attributed to HSV outbreak but has it at other times as well, not orthostatic. This may be the case but it would be unusual, she needs further evaluation.   Vestibular migraines? Treat for migraines and see if improves - Ajovy x 3 months and will see her back before she runs out MRi brain w/wo contrast IAC protocol (thin cuts between the brain stem and ear to see the 8th cranial nerve closer) to look for schwannoma and other etiologies such as CP angle lesions/tumors CT Temporal bones wo CM for semicircular canal dehiscence or other  Paroxysmal ataxia?: Paroxysmal ataxia is a rare movement disorder typically characterized by early-onset attacks of cerebellar ataxia that can last from a few seconds to several days. Duration of the attacks and triggering factors mostly depend on the underlying cause. Test later.   Heart condition? 30-day cardiac monitor  DAT Scan for Parkinsonism (imbalance, loss of smell, falls, decreaed arm swing)  Behcet's Disease?  - [facility]: HLA B51 Behcet's Disease association test? - Ask Almond Lint to see if she can biopsy when patient gets a lesion  No orders of the defined types were placed in this encounter.  No orders of the defined types were placed in this encounter.   Cc: Aliene Beams, MD,  Aliene Beams, MD  Naomie Dean, MD  [facility] [address] [place], [place] [address]  Phone [phone] Fax [phone]

## 2023-10-20 ENCOUNTER — Telehealth: Payer: Self-pay | Admitting: Neurology

## 2023-10-20 ENCOUNTER — Encounter: Payer: Self-pay | Admitting: Neurology

## 2023-10-20 ENCOUNTER — Ambulatory Visit: Payer: Managed Care, Other (non HMO) | Admitting: Neurology

## 2023-10-20 VITALS — BP 122/83 | HR 77 | Ht 64.0 in | Wt 187.0 lb

## 2023-10-20 DIAGNOSIS — H9201 Otalgia, right ear: Secondary | ICD-10-CM

## 2023-10-20 DIAGNOSIS — R2689 Other abnormalities of gait and mobility: Secondary | ICD-10-CM

## 2023-10-20 DIAGNOSIS — R42 Dizziness and giddiness: Secondary | ICD-10-CM

## 2023-10-20 NOTE — Patient Instructions (Addendum)
 Vestibular rehab for dizziness and vertigo and imbalance ENT evaluation for right ear pain, vertigo, dizziness, imbalance Consider Cymbalta for possible PPPD? CT temporal bones will call with results

## 2023-10-20 NOTE — Telephone Encounter (Signed)
 Received another referral for ENT for the patient. Comments section: Patient prefers a new physician she ha seen dr Pollyann Kennedy in the past several years ago and if possible would like to see another physician   Referral for ENT also sent through [facility] to Dr Janeece Riggers Philomena Doheny. Phone: [phone], Fax: [phone]

## 2023-10-20 NOTE — Telephone Encounter (Signed)
 Referral for ENT fax to [facility]. Phone: [phone]

## 2023-10-28 ENCOUNTER — Ambulatory Visit
Admission: RE | Admit: 2023-10-28 | Discharge: 2023-10-28 | Disposition: A | Discharge status: Home / Self Care | Source: Ambulatory Visit | Attending: Neurology | Admitting: Neurology

## 2023-10-28 DIAGNOSIS — H93A9 Pulsatile tinnitus, unspecified ear: Secondary | ICD-10-CM

## 2023-10-28 DIAGNOSIS — R2689 Other abnormalities of gait and mobility: Secondary | ICD-10-CM

## 2023-10-28 DIAGNOSIS — R296 Repeated falls: Secondary | ICD-10-CM

## 2023-10-28 DIAGNOSIS — H9190 Unspecified hearing loss, unspecified ear: Secondary | ICD-10-CM

## 2023-10-28 DIAGNOSIS — R42 Dizziness and giddiness: Secondary | ICD-10-CM

## 2023-10-28 DIAGNOSIS — H814 Vertigo of central origin: Secondary | ICD-10-CM

## 2023-10-28 MED ORDER — IOPAMIDOL (ISOVUE-300) INJECTION 61%
75.0000 mL | Freq: Once | INTRAVENOUS | Status: AC | PRN
  Administered 2023-10-28: 75 mL via INTRAVENOUS

## 2023-11-02 ENCOUNTER — Encounter: Payer: Self-pay | Admitting: Neurology

## 2023-11-03 ENCOUNTER — Encounter: Payer: Self-pay | Admitting: Neurology

## 2023-11-05 ENCOUNTER — Other Ambulatory Visit (HOSPITAL_COMMUNITY): Payer: Self-pay

## 2023-11-05 ENCOUNTER — Encounter: Payer: Self-pay | Admitting: Physical Therapy

## 2023-11-05 ENCOUNTER — Ambulatory Visit: Attending: Neurology | Admitting: Physical Therapy

## 2023-11-05 DIAGNOSIS — R2681 Unsteadiness on feet: Secondary | ICD-10-CM | POA: Insufficient documentation

## 2023-11-05 DIAGNOSIS — R2689 Other abnormalities of gait and mobility: Secondary | ICD-10-CM | POA: Insufficient documentation

## 2023-11-05 DIAGNOSIS — R42 Dizziness and giddiness: Secondary | ICD-10-CM | POA: Insufficient documentation

## 2023-11-05 NOTE — Therapy (Unsigned)
 OUTPATIENT PHYSICAL THERAPY VESTIBULAR EVALUATION     Patient Name: Robin Maynard MRN: [id number] DOB:Sep 23, 1965, 58 y.o., female Today's Date: 11/06/2023  END OF SESSION:  PT End of Session - 11/06/23 1359     Visit Number 1    Number of Visits 5    Date for PT Re-Evaluation 12/18/23   pushed out due to pt going out of town for 2 weeks   Authorization Type [employer]    PT Start Time 1102    PT Stop Time 1150    PT Time Calculation (min) 48 min    Activity Tolerance Patient tolerated treatment well    Behavior During Therapy WFL for tasks assessed/performed             Past Medical History:  Diagnosis Date   Acute pancreatitis    Autoimmune thyroiditis    Constipation    Diverticulitis    Gastritis    GERD (gastroesophageal reflux disease)    GI work up in progress   Hashimoto's thyroiditis    Headache    Heartburn    Herpes    Joint pain    Mass on back    Obesity    Pancreatic disease    PONV (postoperative nausea and vomiting)    Rheumatoid arthritis (HCC)    SOBOE (shortness of breath on exertion)    Thyroid condition    Vitamin D deficiency    Past Surgical History:  Procedure Laterality Date   COLONOSCOPY     DILITATION & CURRETTAGE/HYSTROSCOPY WITH HYDROTHERMAL ABLATION N/A 12/26/2016   Procedure: DILATATION & CURETTAGE/HYSTEROSCOPY WITH HYDROTHERMAL ABLATION;  Surgeon: Marcelle Overlie, MD;  Location: [facility];  Service: Gynecology;  Laterality: N/A;   MASS EXCISION Right 02/23/2018   Procedure: EXCISION BACK MASS ERAS PATHWAY;  Surgeon: Emelia Loron, MD;  Location: [facility];  Service: General;  Laterality: Right;   POLYPECTOMY     TONSILLECTOMY AND ADENOIDECTOMY     TYMPANOSTOMY TUBE PLACEMENT     Patient Active Problem List   Diagnosis Date Noted   Hashimoto's thyroiditis 07/07/2022   H/o Low serum cortisol level 07/07/2022   Prediabetes 06/30/2022   Hypercholesterolemia 05/15/2022   Other  hyperlipidemia 04/15/2022   Vitamin D deficiency 04/15/2022   Pre-diabetes 04/15/2022   Rheumatoid arthritis (HCC) 04/15/2022   Closed fracture of right clavicle 04/30/2019   Abdominal pain, epigastric 09/30/2016   Acute pancreatitis 09/27/2016   Constipation 09/27/2016   Overweight (BMI 25.0-29.9) 09/27/2016   Mass of right lower leg 08/11/2013   Mass on back 08/11/2013    PCP: Arbutus Leas, MD REFERRING PROVIDER: Anson Fret, MD  REFERRING DIAG: R42 (ICD-10-CM) - Vertigo R42 (ICD-10-CM) - Dizziness R26.89 (ICD-10-CM) - Imbalance  THERAPY DIAG:  Dizziness and giddiness - Plan: PT plan of care cert/re-cert  Unsteadiness on feet - Plan: PT plan of care cert/re-cert  ONSET DATE: Referral date 10-20-23  Rationale for Evaluation and Treatment: Rehabilitation  SUBJECTIVE:   SUBJECTIVE STATEMENT: Pt reports initial onset 3 yrs ago - got up and about fell over - no dizziness, about fell over   Pt reports less SLS on LLE than on RLE; pt states this is progressive; has white noise in Rt ear (not in Lt ear) Pt accompanied by: self  PERTINENT HISTORY: Per chart note HPI:  "Robin Maynard is a 58 y.o. female here as requested by Aliene Beams, MD for imbalance. No dizziness. has Mass of right lower leg; Mass on back; Acute pancreatitis;  Constipation; Overweight (BMI 25.0-29.9); Abdominal pain, epigastric; Closed fracture of right clavicle; Other hyperlipidemia; Vitamin D deficiency; Pre-diabetes; Rheumatoid arthritis (HCC); Hypercholesterolemia; Prediabetes; Hashimoto's thyroiditis; and H/o Low serum cortisol level on their problem list."  PAIN:  Are you having pain?  Moderate pain in Rt temporal region - etiology of pain unknown   PRECAUTIONS: None  RED FLAGS: None   WEIGHT BEARING RESTRICTIONS: No  FALLS: Has patient fallen in last 6 months? No  LIVING ENVIRONMENT: Lives with: lives with their spouse Lives in: House/apartment  PLOF: Independent  PATIENT  GOALS: improve balance and dizziness  OBJECTIVE:  Note: Objective measures were completed at Evaluation unless otherwise noted.  DIAGNOSTIC FINDINGS: IMPRESSION: Normal MRI scan of the brain with and without contrast and thin sections through internal auditory canals.  IMPRESSION: Normal CT of the temporal bones.  COGNITION: Overall cognitive status: Within functional limits for tasks assessed   SENSATION: WFL   Cervical ROM:  WNL's   GAIT: Gait pattern: WFL Distance walked: 100' Assistive device utilized: None Level of assistance: Complete Independence Comments: no unsteadiness noted - pt reports today is a good day for her with minimal symptoms  FUNCTIONAL TESTS:  Functional gait assessment: 28/30 MCTSIB: Condition 1: Avg of 3 trials: 30 sec, Condition 2: Avg of 3 trials: 30 sec, Condition 3: Avg of 3 trials: 30 sec, Condition 4: Avg of 3 trials: 30 sec, and Total Score: 120/120    11/05/23 0001  Functional Gait  Assessment  Gait assessed  Yes  Gait Level Surface 3  Change in Gait Speed 3  Gait with Horizontal Head Turns 2  Gait with Vertical Head Turns 3  Gait and Pivot Turn 3  Step Over Obstacle 3  Gait with Narrow Base of Support 3  Gait with Eyes Closed 2  Ambulating Backwards 3  Steps 3  Total Score 28     VESTIBULAR ASSESSMENT:  GENERAL OBSERVATION: pt is a 58 yr old lady amb. Independently without device -    SYMPTOM BEHAVIOR:  Subjective history:  pt reports initial onset approx. 3 yrs ago - feels like symptoms and impaired balance are progressively getting worse - etiology is unknown; pt has been referred to Dr. Suszanne Conners (no appt as of yet) and also referred to [facility]  Non-Vestibular symptoms: changes in vision, headaches, nausea/vomiting, and Dec. 2024 -  recent flare up  Type of dizziness: Imbalance (Disequilibrium), Unsteady with head/body turns, and "Funny feeling in the head"  Frequency: daily - varies in intensity  Duration:   ongoing  Aggravating factors: Spontaneous, Induced by motion: bending down to the ground and turning body quickly, Worse with fatigue, Worse in the dark, and Worse outside or in busy environment  Relieving factors: avoid busy/distracting environments and adjusts activity  Progression of symptoms: worse  OCULOMOTOR EXAM:  Ocular Alignment: normal  Ocular ROM: No Limitations  Spontaneous Nystagmus: absent  Gaze-Induced Nystagmus: absent  Smooth Pursuits: intact and mild nystagmus "jumping" noted with horizontal smooth pursuits  Saccades: intact   VESTIBULAR - OCULAR REFLEX:    Dynamic Visual Acuity: Static: line 9 Dynamic: line 5 with 1 incorrect letter read   POSITIONAL TESTING: Not tested based on symptoms  MOTION SENSITIVITY:  No dizziness reported in today's eval - pt reports more imbalance  Motion Sensitivity Quotient Intensity: 0 = none, 1 = Lightheaded, 2 = Mild, 3 = Moderate, 4 = Severe, 5 = Vomiting  Intensity  1. Sitting to supine   2. Supine to L side  3. Supine to R side   4. Supine to sitting   5. L Hallpike-Dix   6. Up from L    7. R Hallpike-Dix   8. Up from R    9. Sitting, head tipped to L knee   10. Head up from L knee   11. Sitting, head tipped to R knee   12. Head up from R knee   13. Sitting head turns x5   14.Sitting head nods x5   15. In stance, 180 turn to L    16. In stance, 180 turn to R                                                                                                                               TREATMENT DATE: 11-05-23    Gaze Adaptation:  x1 Viewing Horizontal: Position: standing, Time: 30 secs, Reps: 1, and Comment: mild dizziness provoked and x1 Viewing Vertical:  Position: standing, Time: 30 secs, Reps: 1, and Comment: no dizziness provoked  PATIENT EDUCATION: Education details: x1 viewing Person educated: Patient Education method: Explanation, Demonstration, and Handouts Education comprehension: verbalized  understanding and returned demonstration  HOME EXERCISE PROGRAM: x1 viewing exercise issued on 11-05-23 Tip Card  1.The goal of habituation training is to assist in decreasing symptoms of vertigo, dizziness, or nausea provoked by specific head and body motions. 2.These exercises may initially increase symptoms; however, be persistent and work through symptoms. With repetition and time, the exercises will assist in reducing or eliminating symptoms. 3.Exercises should be stopped and discussed with the therapist if you experience any of the following: - Sudden change or fluctuation in hearing - New onset of ringing in the ears, or increase in current intensity - Any fluid discharge from the ear - Severe pain in neck or back - Extreme nausea  Copyright  VHI. All rights reserved.    Gaze Stabilization: Standing Feet Apart    Feet shoulder width apart, keeping eyes on target on wall _5___ feet away, tilt head down 15-30 and move head side to side for _60___ seconds. Repeat while moving head up and down for __60__ seconds. Do __3-5__ sessions per day. Repeat using target on pattern background.     GOALS: Goals reviewed with patient? Yes  SHORT TERM GOALS: same as LTG's   LONG TERM GOALS: Target date: 12-18-23  Perform SOT and establish goal as appropriate.  Baseline:  Goal status: INITIAL  2.   Independent in HEP for balance/vestibular exercises. Baseline:  Goal status: INITIAL  3.  Complete DHI and esatblish goal as appropriate. Baseline:  Goal status: INITIAL  ASSESSMENT:  CLINICAL IMPRESSION: Patient is a 58 y.o. lady who was seen today for physical therapy evaluation and treatment for dizziness and imbalance of unknown etiology.  Pt reports no dizziness during today's initial eval but reports her problem is imbalance which she states she feels is getting progressively worse.  Pt's DVA is a 5 line  difference, indicative of impaired VOR.  Nystagmus was noted (mild frog  jumping) with horizontal smooth pursuit testing but no nystagmus noted with vertical smooth pursuit testing.  Pt's FGA score = 28/30 in today's session.  Pt's symptoms appear to be consistent with vestibular hypofunction.  Pt will benefit from PT to address balance and vestibular deficits.   OBJECTIVE IMPAIRMENTS: decreased balance and dizziness.   ACTIVITY LIMITATIONS:  None - pt reports she is functional   PARTICIPATION LIMITATIONS:  N/A  PERSONAL FACTORS: Time since onset of injury/illness/exacerbation and unknown etiology of diagnosis  are also affecting patient's functional outcome.   REHAB POTENTIAL: Good  CLINICAL DECISION MAKING: Evolving/moderate complexity  EVALUATION COMPLEXITY: Moderate   PLAN:  PT FREQUENCY: 1x/week  PT DURATION: 4 weeks  PLANNED INTERVENTIONS: 97110-Therapeutic exercises, 97530- Therapeutic activity, [id number]- Neuromuscular re-education, [phone]- Self Care, [id number]- Gait training, and [phone]- Canalith repositioning  PLAN FOR NEXT SESSION: do SOT and DHI    Sharlena Kristensen, Donavan Burnet, PT 11/06/2023, 2:46 PM

## 2023-11-05 NOTE — Patient Instructions (Signed)
  Tip Card  1.The goal of habituation training is to assist in decreasing symptoms of vertigo, dizziness, or nausea provoked by specific head and body motions. 2.These exercises may initially increase symptoms; however, be persistent and work through symptoms. With repetition and time, the exercises will assist in reducing or eliminating symptoms. 3.Exercises should be stopped and discussed with the therapist if you experience any of the following: - Sudden change or fluctuation in hearing - New onset of ringing in the ears, or increase in current intensity - Any fluid discharge from the ear - Severe pain in neck or back - Extreme nausea  Copyright  VHI. All rights reserved.    Gaze Stabilization: Standing Feet Apart    Feet shoulder width apart, keeping eyes on target on wall __5__ feet away, tilt head down 15-30 and move head side to side for __60__ seconds. Repeat while moving head up and down for __60__ seconds. Do _3-5___ sessions per day. Repeat using target on pattern background.  Copyright  VHI. All rights reserved.

## 2023-11-06 NOTE — Progress Notes (Signed)
   11/05/23 0001  Functional Gait  Assessment  Gait assessed  Yes  Gait Level Surface 3  Change in Gait Speed 3  Gait with Horizontal Head Turns 2  Gait with Vertical Head Turns 3  Gait and Pivot Turn 3  Step Over Obstacle 3  Gait with Narrow Base of Support 3  Gait with Eyes Closed 2  Ambulating Backwards 3  Steps 3  Total Score 28

## 2023-11-11 ENCOUNTER — Ambulatory Visit: Payer: Managed Care, Other (non HMO) | Admitting: Gastroenterology

## 2023-11-11 ENCOUNTER — Encounter: Payer: Self-pay | Admitting: Gastroenterology

## 2023-11-11 ENCOUNTER — Other Ambulatory Visit (HOSPITAL_COMMUNITY): Payer: Self-pay

## 2023-11-11 VITALS — BP 104/74 | HR 72 | Ht 63.5 in | Wt 184.2 lb

## 2023-11-11 DIAGNOSIS — R1012 Left upper quadrant pain: Secondary | ICD-10-CM | POA: Diagnosis not present

## 2023-11-11 DIAGNOSIS — K209 Esophagitis, unspecified without bleeding: Secondary | ICD-10-CM

## 2023-11-11 MED ORDER — HYOSCYAMINE SULFATE 0.125 MG SL SUBL
0.1250 mg | SUBLINGUAL_TABLET | SUBLINGUAL | 0 refills | Status: AC | PRN
  Filled 2023-11-11: qty 20, 4d supply, fill #0

## 2023-11-11 NOTE — Patient Instructions (Signed)
 Your provider has requested that you go to the basement level for lab work before leaving today. Press "B" on the elevator. The lab is located at the first door on the left as you exit the elevator.   Order for CT scan has been given to you and faxed to [facility]. Someone will contact you to schedule your CT scan.   We have sent the following medications to your pharmacy for you to pick up at your convenience: Levsin   You may use Pepcid as needed rather PPI.   _______________________________________________________  If your blood pressure at your visit was 140/90 or greater, please contact your primary care physician to follow up on this.  _______________________________________________________  If you are age 59 or older, your body mass index should be between 23-30. Your Body mass index is 32.13 kg/m. If this is out of the aforementioned range listed, please consider follow up with your Primary Care Provider.  If you are age 66 or younger, your body mass index should be between 19-25. Your Body mass index is 32.13 kg/m. If this is out of the aformentioned range listed, please consider follow up with your Primary Care Provider.   ________________________________________________________  The [facility] providers would like to encourage you to use [facility] to communicate with providers for non-urgent requests or questions.  Due to long hold times on the telephone, sending your provider a message by [facility] may be a faster and more efficient way to get a response.  Please allow 48 business hours for a response.  Please remember that this is for non-urgent requests.  _______________________________________________________  Thank you for choosing me and [facility].  Dr. Meridee Score

## 2023-11-11 NOTE — Progress Notes (Signed)
 GASTROENTEROLOGY OUTPATIENT CLINIC VISIT   Primary Care Provider Deanna Expose, MD [address]  [address] [place] [address] [phone]  Patient Profile: Robin Maynard is a 58 y.o. female with a pmh significant for Hashimoto's, RA, diverticulosis (with prior diverticulitis), colon polyps (TA's), gastritis, esophagitis, family history of gastric cancer.  The patient presents to the [facility] for an evaluation and management of problem(s) noted below:  Problem List 1. LUQ pain   2. Esophagitis    Discussed the use of AI scribe software for clinical note transcription with the patient, who gave verbal consent to proceed.  History of Present Illness The patient returns for follow-up in clinic.  I last saw the patient at the time of her upper endoscopy.  She continues to experience a recurrent left upper quadrant abdominal pain.  It is not associated with reflux or pyrosis.  It occurs intermittently, with episodes lasting up to several hours, such as one recent episode that lasted from morning until midday while she was at work. No specific dietary or lifestyle triggers.  PPI has not been significantly helpful in changing her symptoms either.  Her previous gastrointestinal workup includes previous CT scans and ultrasounds and laboratories.  She has not used antispasmodics like Levsin or Bentyl recently, though they were prescribed years ago for suspected irritable bowel syndrome without much effect for other issues but is open to trying if it is felt potentially beneficial.   Bowel movements are regular, occurring once daily without blood.  She experiences rare nausea, which she does not attribute to her stomach issues.  She has been undergoing additional workup for peripheral vision changes without a definitive diagnosis and will be following with ENT soon.  Her family history includes her father who died of gastric cancer and her mother who had unspecified  GI issues.  Dietary habits have changed significantly as she avoids processed foods and red meat, focusing on fruits, vegetables, chicken, and fish. Despite these changes, she is frustrated by minimal weight loss, losing only about a pound every two weeks. She practices intermittent fasting, often skipping breakfast and sometimes lunch, depending on her work schedule. Drinking coffee sometimes alleviates her pain.  She wonders if parasitic infections could be playing any role with her symptoms.   GI Review of Systems Positive as above Negative for dysphagia, odynophagia, pyrosis, bloating, alteration of bowel habits, melena, hematochezia   Review of Systems General: Denies fevers/chills/weight loss unintentionally Cardiovascular: Denies chest pain Pulmonary: Denies shortness of breath Gastroenterological: See HPI Genitourinary: Denies darkened urine Hematological: Denies easy bruising/bleeding Dermatological: Denies jaundice Psychological: Mood is stable   Medications Current Outpatient Medications  Medication Sig Dispense Refill   albuterol (VENTOLIN HFA) 108 (90 Base) MCG/ACT inhaler Inhale 2 puffs every 6 (six) hours as needed for wheezing. 6.7 g 0   aspirin EC (ASPIRIN 81) 81 MG tablet Take 1 tablet by mouth once daily 30 tablet 0   celecoxib (CELEBREX) 50 MG capsule Take 2 capsules (100 mg total) by mouth 2 (two)  times daily as needed for Pain. 60 capsule 1   Cholecalciferol (VITAMIN D3) 50 MCG (2000 UT) capsule Take 1 capsule (2,000 Units total) by mouth daily.     Fremanezumab-vfrm (AJOVY) 225 MG/1.5ML SOAJ Inject 225 mg into the skin every 30 (thirty) days. 1.5 mL 2   hyoscyamine (LEVSIN SL) 0.125 MG SL tablet Place 1 tablet (0.125 mg total) under the tongue every 4 (four) hours as needed. 20 tablet 0  Magnesium Gluconate 550 MG TABS Take 550 mg by mouth. 2 to 3 times a week     Multiple Vitamin (MULTI-VITAMIN) tablet Take 1 tablet by mouth daily.     Omega-3 1000 MG CAPS Take  by mouth.     omeprazole (PRILOSEC) 40 MG capsule Take 1 capsule (40 mg total) by mouth daily. 30 capsule 6   rosuvastatin (CRESTOR) 5 MG tablet Take 1 tablet (5 mg total) by mouth daily. 90 tablet 1   Turmeric 400 MG CAPS See admin instructions.     valACYclovir (VALTREX) 1000 MG tablet Take 1 tablet (1,000 mg total) by mouth 2 (two) times daily. 180 tablet 3   zolpidem (AMBIEN) 10 MG tablet Take 1 tablet (10 mg total) by mouth at bedtime. 30 tablet 3   sucralfate (CARAFATE) 1 g tablet Take 1 tablet (1 g total) by mouth 2 (two) times daily. (Patient not taking: Reported on 11/11/2023) 60 tablet 1   No current facility-administered medications for this visit.    Allergies Allergies  Allergen Reactions   Doxycycline Nausea And Vomiting   Flulaval Quadrivalent [Influenza Vac Split Quad] Other (See Comments)    Weakness and don't feel good from the vaccine   Hydroxychloroquine Other (See Comments)    Nausea/malaise/reflux     Histories Past Medical History:  Diagnosis Date   Acute pancreatitis    Autoimmune thyroiditis    Constipation    Diverticulitis    Gastritis    GERD (gastroesophageal reflux disease)    GI work up in progress   Hashimoto's thyroiditis    Headache    Heartburn    Herpes    Joint pain    Mass on back    Obesity    Pancreatic disease    PONV (postoperative nausea and vomiting)    Rheumatoid arthritis (HCC)    SOBOE (shortness of breath on exertion)    Thyroid condition    Vitamin D deficiency    Past Surgical History:  Procedure Laterality Date   COLONOSCOPY     DILITATION & CURRETTAGE/HYSTROSCOPY WITH HYDROTHERMAL ABLATION N/A 12/26/2016   Procedure: DILATATION & CURETTAGE/HYSTEROSCOPY WITH HYDROTHERMAL ABLATION;  Surgeon: Thurman Flores, MD;  Location: [facility];  Service: Gynecology;  Laterality: N/A;   MASS EXCISION Right 02/23/2018   Procedure: EXCISION BACK MASS ERAS PATHWAY;  Surgeon: Enid Harry, MD;  Location: [facility];  Service: General;  Laterality: Right;   POLYPECTOMY     TONSILLECTOMY AND ADENOIDECTOMY     TYMPANOSTOMY TUBE PLACEMENT     Social History   Socioeconomic History   Marital status: Married    Spouse name: Myrtie Atkinson   Number of children: Not on file   Years of education: Not on file   Highest education level: Not on file  Occupational History   Occupation: CRNA  Tobacco Use   Smoking status: Never    Passive exposure: Current   Smokeless tobacco: Never  Vaping Use   Vaping status: Never Used  Substance and Sexual Activity   Alcohol use: No   Drug use: No   Sexual activity: Not on file    Comment: ablation  Other Topics Concern   Not on file  Social History Narrative   Married since 1985.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   Social Drivers of Health   Financial Resource Strain: Not on file  Food Insecurity: Low Risk  (06/09/2023)   Received from [facility]   Hunger Vital Sign    Worried About  Running Out of Food in the Last Year: Never true    Ran Out of Food in the Last Year: Never true  Transportation Needs: No Transportation Needs (06/09/2023)   Received from [facility]    In the past 12 months, has lack of reliable transportation kept you from medical appointments, meetings, work or from getting things needed for daily living? : No  Physical Activity: Not on file  Stress: Not on file  Social Connections: Unknown (05/06/2023)   Received from [facility]   Social Network    Social Network: Not on file  Intimate Partner Violence: Unknown (05/06/2023)   Received from [facility]   HITS    Physically Hurt: Not on file    Insult or Talk Down To: Not on file    Threaten Physical Harm: Not on file    Scream or Curse: Not on file   Family History  Problem Relation Age of Onset   Deep vein thrombosis Mother    Colon polyps Mother    Heart disease Mother    Depression Mother    Anxiety disorder Mother    Schizophrenia  Mother    Obesity Mother    Cancer Father    Hyperlipidemia Father    Gastric cancer Father    Diabetes Father    Heart disease Father    Hypertension Father    Stomach cancer Father    Breast cancer Maternal Aunt    Diabetes Paternal Grandmother    Heart disease Paternal Grandmother    Aortic stenosis Son    Colon cancer Neg Hx    Allergic rhinitis Neg Hx    Asthma Neg Hx    Angioedema Neg Hx    Eczema Neg Hx    Urticaria Neg Hx    Rectal cancer Neg Hx    Esophageal cancer Neg Hx    Inflammatory bowel disease Neg Hx    Liver disease Neg Hx    Pancreatic cancer Neg Hx    I have reviewed her medical, social, and family history in detail and updated the electronic medical record as necessary.    PHYSICAL EXAMINATION  BP 104/74 (BP Location: Left Arm, Patient Position: Sitting, Cuff Size: Large)   Pulse 72   Ht 5' 3.5" (1.613 m) Comment: height measured without shoes  Wt 184 lb 4 oz (83.6 kg)   BMI 32.13 kg/m  Wt Readings from Last 3 Encounters:  11/11/23 184 lb 4 oz (83.6 kg)  10/20/23 187 lb (84.8 kg)  08/18/23 183 lb (83 kg)  GEN: NAD, appears stated age, doesn't appear chronically ill PSYCH: Cooperative, without pressured speech EYE: Conjunctivae pink, sclerae anicteric ENT: MMM CV: Nontachycardic RESP: No audible wheezing GI: NABS, soft, NT/ND, without rebound or guarding MSK/EXT: No significant lower extremity edema SKIN: No jaundice NEURO:  Alert & Oriented x 3, no focal deficits   REVIEW OF DATA  I reviewed the following data at the time of this encounter:  GI Procedures and Studies  January 2025 EGD - No gross lesions in the proximal esophagus and in the mid esophagus. - LA Grade A esophagitis with no bleeding found distally. - Z-line irregular, 34 cm from the incisors. - 2 cm hiatal hernia. - Multiple gastric polyps - appearance of fundic gland polyps. 2 were resected and retrieved. - Gastritis. Biopsied. - No gross lesions in the duodenal bulb, in the  first portion of the duodenum and in the second portion of the duodenum. Biopsied.  Laboratory Studies  Reviewed those in epic  Imaging Studies  No new imaging studies to review   ASSESSMENT  Ms. Arpin is a 58 y.o. female with a pmh significant for Hashimoto's, RA, diverticulosis (with prior diverticulitis), colon polyps (TA's), gastritis, esophagitis, family history of gastric cancer.  The patient is seen today for evaluation and management of:  1. LUQ pain   2. Esophagitis    The patient is hemodynamically and clinically stable.  Etiology of her chronic LUQ discomfort has been unremarkable.  We will go ahead and plan updated cross-sectional imaging to ensure no other etiologies have occurred with regards to the spleen or pancreas to be in this particular region.  I am going to trial Levsin, though not convinced that this will be greatly helpful but we will see.  I am going to have laboratories pending to evaluate pancreatic enzymes and liver biochemical testing, that the patient can come in 6 to 12 hours after an episode of discomfort to see if there rising, which would increase concern for biliary/gallbladder dyskinesia disorders.  With regard to the patient's esophagitis, she has no pyrosis symptoms and has not had any pyrosis symptoms.  Will plan to consider as needed Pepcid use at this point.  We are going to forego repeat upper endoscopy for now.  She will be due for colon polyp surveillance in 2029.  All patient questions were answered to the best of my ability, and the patient agrees to the aforementioned plan of action with follow-up as indicated.   PLAN  Proceed with CT abdomen pelvis with contrast CMP/amylase/lipase to be drawn within 6 to 12 hours of significant episode of pain/discomfort Levsin 0.125 twice daily as needed - If helpful can give further prescriptions if not can stop Colonoscopy for surveillance 2029   Orders Placed This Encounter  Procedures   CT ABDOMEN  PELVIS W CONTRAST   Comp Met (CMET)   Amylase   Lipase    New Prescriptions   HYOSCYAMINE (LEVSIN SL) 0.125 MG SL TABLET    Place 1 tablet (0.125 mg total) under the tongue every 4 (four) hours as needed.   Modified Medications   No medications on file    Planned Follow Up No follow-ups on file.   Total Time in Face-to-Face and in Coordination of Care for patient including independent/personal interpretation/review of prior testing, medical history, examination, medication adjustment, communicating results with the patient directly, and documentation within the EHR is 25 minutes.   Yong Henle, MD [facility] Office # [phone]

## 2023-11-13 ENCOUNTER — Other Ambulatory Visit: Payer: Self-pay | Admitting: Gastroenterology

## 2023-11-14 ENCOUNTER — Encounter: Payer: Self-pay | Admitting: Gastroenterology

## 2023-11-18 ENCOUNTER — Other Ambulatory Visit: Payer: Self-pay

## 2023-11-21 ENCOUNTER — Other Ambulatory Visit (HOSPITAL_COMMUNITY): Payer: Self-pay

## 2023-11-25 ENCOUNTER — Other Ambulatory Visit (HOSPITAL_COMMUNITY): Payer: Self-pay

## 2023-11-25 MED ORDER — CICLOPIROX 8 % EX SOLN
CUTANEOUS | 0 refills | Status: DC
  Filled 2023-11-25: qty 6.6, 30d supply, fill #0

## 2023-11-26 ENCOUNTER — Other Ambulatory Visit: Payer: Self-pay

## 2023-12-04 ENCOUNTER — Encounter: Payer: Self-pay | Admitting: Gastroenterology

## 2023-12-04 NOTE — Progress Notes (Signed)
 Review of outside CTAP  Results reviewed in care everywhere 11/27/2023 CTAP with contrast FINDINGS:   The visualized portions of the lower chest are unremarkable.   The liver, gallbladder, spleen, pancreas, and adrenal glands are unremarkable.   Small hypoattenuating lesion in the right kidney, too small to characterize but likely cysts. No hydronephrosis. The bladder is unremarkable. The uterus is present.   The small bowel is nondilated. Colonic diverticulosis without evidence of diverticulitis. The appendix is not seen.   There is no free air or free fluid. No bulky adenopathy. Mild aortoiliac atherosclerotic disease.   No acute osseous abnormality is seen. Superior endplate irregularity of the L1 vertebral body may be degenerative.   IMPRESSION:  1. No acute abnormality.    We will make sure that the patient has access to these records.  No change in her previously discussed plan of action from clinic visit last month.

## 2023-12-08 ENCOUNTER — Other Ambulatory Visit (HOSPITAL_COMMUNITY): Payer: Self-pay

## 2023-12-08 ENCOUNTER — Ambulatory Visit: Attending: Neurology | Admitting: Physical Therapy

## 2023-12-08 DIAGNOSIS — R2681 Unsteadiness on feet: Secondary | ICD-10-CM | POA: Insufficient documentation

## 2023-12-08 DIAGNOSIS — R42 Dizziness and giddiness: Secondary | ICD-10-CM | POA: Insufficient documentation

## 2023-12-08 MED ORDER — VALACYCLOVIR HCL 1 G PO TABS: 1000.0000 mg | ORAL_TABLET | Freq: Two times a day (BID) | ORAL | 3 refills | Status: AC

## 2023-12-08 NOTE — Therapy (Unsigned)
 OUTPATIENT PHYSICAL THERAPY VESTIBULAR TREATMENT NOTE     Patient Name: Robin Maynard MRN: [id number] DOB:September 13, 1965, 58 y.o., female Today's Date: 12/09/2023  END OF SESSION:  PT End of Session - 12/09/23 1356     Visit Number 2    Number of Visits 5    Date for PT Re-Evaluation 12/18/23   pushed out due to pt going out of town for 2 weeks   Authorization Type [employer]    PT Start Time 1101    PT Stop Time 1145    PT Time Calculation (min) 44 min    Equipment Utilized During Treatment Other (comment)   harness vest used with SOT   Activity Tolerance Patient tolerated treatment well    Behavior During Therapy WFL for tasks assessed/performed              Past Medical History:  Diagnosis Date   Acute pancreatitis    Autoimmune thyroiditis    Constipation    Diverticulitis    Gastritis    GERD (gastroesophageal reflux disease)    GI work up in progress   Hashimoto's thyroiditis    Headache    Heartburn    Herpes    Joint pain    Mass on back    Obesity    Pancreatic disease    PONV (postoperative nausea and vomiting)    Rheumatoid arthritis (HCC)    SOBOE (shortness of breath on exertion)    Thyroid  condition    Vitamin D  deficiency    Past Surgical History:  Procedure Laterality Date   COLONOSCOPY     DILITATION & CURRETTAGE/HYSTROSCOPY WITH HYDROTHERMAL ABLATION N/A 12/26/2016   Procedure: DILATATION & CURETTAGE/HYSTEROSCOPY WITH HYDROTHERMAL ABLATION;  Surgeon: Thurman Flores, MD;  Location: [facility] [place];  Service: Gynecology;  Laterality: N/A;   MASS EXCISION Right 02/23/2018   Procedure: EXCISION BACK MASS ERAS PATHWAY;  Surgeon: Enid Harry, MD;  [facility];  Service: General;  Laterality: Right;   POLYPECTOMY     TONSILLECTOMY AND ADENOIDECTOMY     TYMPANOSTOMY TUBE PLACEMENT     Patient Active Problem List   Diagnosis Date Noted   Hashimoto's thyroiditis 07/07/2022   H/o Low serum cortisol level  07/07/2022   Prediabetes 06/30/2022   Hypercholesterolemia 05/15/2022   Other hyperlipidemia 04/15/2022   Vitamin D  deficiency 04/15/2022   Pre-diabetes 04/15/2022   Rheumatoid arthritis (HCC) 04/15/2022   Closed fracture of right clavicle 04/30/2019   Abdominal pain, epigastric 09/30/2016   Acute pancreatitis 09/27/2016   Constipation 09/27/2016   Overweight (BMI 25.0-29.9) 09/27/2016   Mass of right lower leg 08/11/2013   Mass on back 08/11/2013    PCP: Deanna Expose, MD REFERRING PROVIDER: Glory Larsen, MD  REFERRING DIAG: R42 (ICD-10-CM) - Vertigo R42 (ICD-10-CM) - Dizziness R26.89 (ICD-10-CM) - Imbalance  THERAPY DIAG:  Dizziness and giddiness  Unsteadiness on feet  ONSET DATE: Referral date 10-20-23  Rationale for Evaluation and Treatment: Rehabilitation  SUBJECTIVE:   SUBJECTIVE STATEMENT: Pt states she had a [place] trip to [place] a couple of [place] ago; is not having dizziness today - states she is taking steroids (prednisone  pak) for pneumonia and says that she has noticed that the dizziness is always improved when she takes steroids  Pt reports less SLS on LLE than on RLE; pt states this is progressive; has white noise in Rt ear (not in Lt ear) Pt accompanied by: self  PERTINENT HISTORY: Per chart note HPI:  "Robin  MONTEEN Maynard is a 58 y.o. female here as requested by Dorena Gander, MD for imbalance. No dizziness. has Mass of right lower leg; Mass on back; Acute pancreatitis; Constipation; Overweight (BMI 25.0-29.9); Abdominal pain, epigastric; Closed fracture of right clavicle; Other hyperlipidemia; Vitamin D  deficiency; Pre-diabetes; Rheumatoid arthritis (HCC); Hypercholesterolemia; Prediabetes; Hashimoto's thyroiditis; and H/o Low serum cortisol level on their problem list."  PAIN:  Are you having pain?  Moderate pain in Rt temporal region - etiology of pain unknown   PRECAUTIONS: None  RED FLAGS: None   WEIGHT BEARING RESTRICTIONS:  No  FALLS: Has patient fallen in last 6 months? No  LIVING ENVIRONMENT: Lives with: lives with their spouse Lives in: House/apartment  PLOF: Independent  PATIENT GOALS: improve balance and dizziness  OBJECTIVE:  Note: Objective measures were completed at Evaluation unless otherwise noted.  DIAGNOSTIC FINDINGS: IMPRESSION: Normal MRI scan of the brain with and without contrast and thin sections through internal auditory canals.  IMPRESSION: Normal CT of the temporal bones.  COGNITION: Overall cognitive status: Within functional limits for tasks assessed   SENSATION: WFL   Cervical ROM:  WNL's   GAIT: Gait pattern: WFL Distance walked: 100' Assistive device utilized: None Level of assistance: Complete Independence Comments: no unsteadiness noted - pt reports today is a good day for her with minimal symptoms  FUNCTIONAL TESTS:  Functional gait assessment: 28/30 MCTSIB: Condition 1: Avg of 3 trials: 30 sec, Condition 2: Avg of 3 trials: 30 sec, Condition 3: Avg of 3 trials: 30 sec, Condition 4: Avg of 3 trials: 30 sec, and Total Score: 120/120    11/05/23 0001  Functional Gait  Assessment  Gait assessed  Yes  Gait Level Surface 3  Change in Gait Speed 3  Gait with Horizontal Head Turns 2  Gait with Vertical Head Turns 3  Gait and Pivot Turn 3  Step Over Obstacle 3  Gait with Narrow Base of Support 3  Gait with Eyes Closed 2  Ambulating Backwards 3  Steps 3  Total Score 28     VESTIBULAR ASSESSMENT:  GENERAL OBSERVATION: pt is a 58 yr old lady amb. Independently without device -    SYMPTOM BEHAVIOR:  Subjective history:  pt reports initial onset approx. 3 yrs ago - feels like symptoms and impaired balance are progressively getting worse - etiology is unknown; pt has been referred to Dr. Darlin Ehrlich (no appt as of yet) and also referred to [facility]  Non-Vestibular symptoms: changes in vision, headaches, nausea/vomiting, and Dec. 2024 -  recent flare up  Type of  dizziness: Imbalance (Disequilibrium), Unsteady with head/body turns, and "Funny feeling in the head"  Frequency: daily - varies in intensity  Duration:  ongoing  Aggravating factors: Spontaneous, Induced by motion: bending down to the ground and turning body quickly, Worse with fatigue, Worse in the dark, and Worse outside or in busy environment  Relieving factors: avoid busy/distracting environments and adjusts activity  Progression of symptoms: worse  OCULOMOTOR EXAM:  Ocular Alignment: normal  Ocular ROM: No Limitations  Spontaneous Nystagmus: absent  Gaze-Induced Nystagmus: absent  Smooth Pursuits: intact and mild nystagmus "jumping" noted with horizontal smooth pursuits  Saccades: intact   VESTIBULAR - OCULAR REFLEX:    Dynamic Visual Acuity: Static: line 9 Dynamic: line 5 with 1 incorrect letter read   POSITIONAL TESTING: Not tested based on symptoms  MOTION SENSITIVITY:  No dizziness reported in today's eval - pt reports more imbalance  Motion Sensitivity Quotient Intensity: 0 = none, 1 =  Lightheaded, 2 = Mild, 3 = Moderate, 4 = Severe, 5 = Vomiting  Intensity  1. Sitting to supine   2. Supine to L side   3. Supine to R side   4. Supine to sitting   5. L Hallpike-Dix   6. Up from L    7. R Hallpike-Dix   8. Up from R    9. Sitting, head tipped to L knee   10. Head up from L knee   11. Sitting, head tipped to R knee   12. Head up from R knee   13. Sitting head turns x5   14.Sitting head nods x5   15. In stance, 180 turn to L    16. In stance, 180 turn to R                                                                                                                               TREATMENT DATE: 12-08-23  Sensory Organization Test:  Composite score 92/100 (WNL's) N= 70/100 Somatosensory, Visual and Vestibular inputs are all above Norms; preference is above age-related norm   SVA - line 8:  DVA line 4 - was noticed that pt's eyeglasses are progressive lenses  which could be impacting acuity of test and not an actual VOR impairment; pt reported no dizziness upon completion of test  X1 viewing - in standing - target 5' away on plain background; horizontal head turns - 60 secs x 1 rep;  vertical head turns 60 secs x 1 rep; no c/o dizziness reported after completion of this exercise with horizontal nor vertical head turns  Self Care;  discussed pt's symptoms as dizziness is of unknown etiology; reviewed LTG's - decided to place pt on hold with plan for her to schedule a follow up appt if dizziness re-occurs Pt has appt with Dr. Darlin Ehrlich at end of June - pt to follow up with PT regarding results of this ENT visit if not seen prior to this appt.  DHI - score 18%  PATIENT EDUCATION: Education details: x1 viewing Person educated: Patient Education method: Explanation, Demonstration, and Handouts Education comprehension: verbalized understanding and returned demonstration  HOME EXERCISE PROGRAM: x1 viewing exercise issued on 11-05-23 Tip Card  1.The goal of habituation training is to assist in decreasing symptoms of vertigo, dizziness, or nausea provoked by specific head and body motions. 2.These exercises may initially increase symptoms; however, be persistent and work through symptoms. With repetition and time, the exercises will assist in reducing or eliminating symptoms. 3.Exercises should be stopped and discussed with the therapist if you experience any of the following: - Sudden change or fluctuation in hearing - New onset of ringing in the ears, or increase in current intensity - Any fluid discharge from the ear - Severe pain in neck or back - Extreme nausea  Copyright  VHI. All rights reserved.    Gaze Stabilization: Standing Feet Apart    Feet shoulder width  apart, keeping eyes on target on wall _5___ feet away, tilt head down 15-30 and move head side to side for _60___ seconds. Repeat while moving head up and down for __60__ seconds. Do __3-5__  sessions per day. Repeat using target on pattern background.     GOALS: Goals reviewed with patient? Yes  SHORT TERM GOALS: same as LTG's   LONG TERM GOALS: Target date: 12-18-23  Perform SOT and establish goal as appropriate.  Baseline:  Goal status: INITIAL  2.   Independent in HEP for balance/vestibular exercises. Baseline:  Goal status: INITIAL  3.  Complete DHI and esatblish goal as appropriate. Baseline:  Goal status: INITIAL  ASSESSMENT:  CLINICAL IMPRESSION: PT session focused on further vestibular assessment with SOT completed.  Pt's composite score is well above age related norm of 70/100 with her score 92/100.  All sensory inputs including somatosensory, visual and vestibular inputs are all WNL's.  Pt's DVA remains a 4 line difference from SVA but appears to be related to her wearing progressive lenses and not a true gaze stabilization problem with VOR impairment.  Pt reports no dizziness at today's session; plan to place pt on hold with pt to call for appt if dizziness re-occurs.  PT is not warranted at this time due to pt not having dizziness or balance impairments at today's PT appt.   OBJECTIVE IMPAIRMENTS: decreased balance and dizziness.   ACTIVITY LIMITATIONS:  None - pt reports she is functional   PARTICIPATION LIMITATIONS:  N/A  PERSONAL FACTORS: Time since onset of injury/illness/exacerbation and unknown etiology of diagnosis  are also affecting patient's functional outcome.   REHAB POTENTIAL: Good  CLINICAL DECISION MAKING: Evolving/moderate complexity  EVALUATION COMPLEXITY: Moderate   PLAN:  PT FREQUENCY: 1x/week  PT DURATION: 4 weeks  PLANNED INTERVENTIONS: 97110-Therapeutic exercises, 97530- Therapeutic activity, [id number]- Neuromuscular re-education, [phone]- Self Care, 19147- Gait training, and [phone]- Canalith repositioning  PLAN FOR NEXT SESSION:    place on hold for up to 60 days with pt to call for appt if dizziness re-occurs; pt to see  Dr. Darlin Ehrlich end of June   Sukhman Kocher Suzanne, PT 12/09/2023, 1:58 PM

## 2023-12-09 ENCOUNTER — Encounter: Payer: Self-pay | Admitting: Physical Therapy

## 2023-12-14 ENCOUNTER — Other Ambulatory Visit (HOSPITAL_COMMUNITY): Payer: Self-pay

## 2023-12-18 ENCOUNTER — Other Ambulatory Visit (HOSPITAL_COMMUNITY): Payer: Self-pay

## 2023-12-18 MED ORDER — SULFASALAZINE 500 MG PO TBEC
500.0000 mg | DELAYED_RELEASE_TABLET | Freq: Every day | ORAL | 0 refills | Status: DC
  Filled 2023-12-18: qty 30, 30d supply, fill #0

## 2023-12-21 ENCOUNTER — Other Ambulatory Visit (HOSPITAL_COMMUNITY): Payer: Self-pay

## 2023-12-21 MED ORDER — WEGOVY 0.25 MG/0.5ML ~~LOC~~ SOAJ
0.2500 mg | SUBCUTANEOUS | 1 refills | Status: AC
  Filled 2023-12-21: qty 2, 28d supply, fill #0

## 2024-01-02 ENCOUNTER — Other Ambulatory Visit (HOSPITAL_COMMUNITY): Payer: Self-pay

## 2024-01-23 ENCOUNTER — Other Ambulatory Visit (HOSPITAL_COMMUNITY): Payer: Self-pay

## 2024-01-28 ENCOUNTER — Ambulatory Visit: Payer: Self-pay | Attending: Neurology | Admitting: Physical Therapy

## 2024-01-28 DIAGNOSIS — R42 Dizziness and giddiness: Secondary | ICD-10-CM | POA: Diagnosis present

## 2024-01-28 DIAGNOSIS — R2681 Unsteadiness on feet: Secondary | ICD-10-CM | POA: Insufficient documentation

## 2024-01-28 NOTE — Therapy (Signed)
 OUTPATIENT PHYSICAL THERAPY VESTIBULAR TREATMENT NOTE     Patient Name: Robin Maynard MRN: [id number] DOB:[birth year], 58 y.o., female Today's Date: 01/30/2024  END OF SESSION:  PT End of Session - 01/30/24 1808     Visit Number 3    Number of Visits 5    Date for PT Re-Evaluation 12/18/23   pushed out due to pt going out of town for 2 weeks   Authorization Type [employer]    PT Start Time 1535    PT Stop Time 1617    PT Time Calculation (min) 42 min    Equipment Utilized During Treatment Other (comment)   harness vest used with SOT   Activity Tolerance Patient tolerated treatment well    Behavior During Therapy WFL for tasks assessed/performed            Past Medical History:  Diagnosis Date   Acute pancreatitis    Autoimmune thyroiditis    Constipation    Diverticulitis    Gastritis    GERD (gastroesophageal reflux disease)    GI work up in progress   Hashimoto's thyroiditis    Headache    Heartburn    Herpes    Joint pain    Mass on back    Obesity    Pancreatic disease    PONV (postoperative nausea and vomiting)    Rheumatoid arthritis (HCC)    SOBOE (shortness of breath on exertion)    Thyroid  condition    Vitamin D  deficiency    Past Surgical History:  Procedure Laterality Date   COLONOSCOPY     DILITATION & CURRETTAGE/HYSTROSCOPY WITH HYDROTHERMAL ABLATION N/A 12/26/2016   Procedure: DILATATION & CURETTAGE/HYSTEROSCOPY WITH HYDROTHERMAL ABLATION;  Surgeon: Mat Browning, MD;  Location: [facility] [place];  Service: Gynecology;  Laterality: N/A;   MASS EXCISION Right 02/23/2018   Procedure: EXCISION BACK MASS ERAS PATHWAY;  Surgeon: Ebbie Cough, MD;  Location: [facility];  Service: General;  Laterality: Right;   POLYPECTOMY     TONSILLECTOMY AND ADENOIDECTOMY     TYMPANOSTOMY TUBE PLACEMENT     Patient Active Problem List   Diagnosis Date Noted   Hashimoto's thyroiditis 07/07/2022   H/o Low serum cortisol level  07/07/2022   Prediabetes 06/30/2022   Hypercholesterolemia 05/15/2022   Other hyperlipidemia 04/15/2022   Vitamin D  deficiency 04/15/2022   Pre-diabetes 04/15/2022   Rheumatoid arthritis (HCC) 04/15/2022   Closed fracture of right clavicle 04/30/2019   Abdominal pain, epigastric 09/30/2016   Acute pancreatitis 09/27/2016   Constipation 09/27/2016   Overweight (BMI 25.0-29.9) 09/27/2016   Mass of right lower leg 08/11/2013   Mass on back 08/11/2013    PCP: Murleen Fine, MD REFERRING PROVIDER: Ines Onetha NOVAK, MD  REFERRING DIAG: R42 (ICD-10-CM) - Vertigo R42 (ICD-10-CM) - Dizziness R26.89 (ICD-10-CM) - Imbalance  THERAPY DIAG:  Dizziness and giddiness  Unsteadiness on feet  ONSET DATE: Referral date 10-20-23  Rationale for Evaluation and Treatment: Rehabilitation  SUBJECTIVE:   SUBJECTIVE STATEMENT: Pt states she has had 2 falls since early June  - one fall at [facility] on 01-07-24 (was wearing flip flops and says the carpet was patterned), and then one fall in front of [employer] 2 weeks ago - had been raining - pt states her Rt ankle turned a little (supinated):  did not get hurt in either fall but made her a little worried.  Pt states she came off the steroids several weeks ago and feels like her symptoms returned.  Pt reports more imbalance than dizziness at this time.   Pt reports less SLS on LLE than on RLE; pt states this is progressive; has white noise in Rt ear (not in Lt ear) Pt accompanied by: self  PERTINENT HISTORY: Per chart note HPI:  Robin Maynard is a 58 y.o. female here as requested by Rolinda Millman, MD for imbalance. No dizziness. has Mass of right lower leg; Mass on back; Acute pancreatitis; Constipation; Overweight (BMI 25.0-29.9); Abdominal pain, epigastric; Closed fracture of right clavicle; Other hyperlipidemia; Vitamin D  deficiency; Pre-diabetes; Rheumatoid arthritis (HCC); Hypercholesterolemia; Prediabetes; Hashimoto's thyroiditis; and H/o  Low serum cortisol level on their problem list.  PAIN:  Are you having pain? Moderate pain in Rt temporal region - etiology of pain unknown   PRECAUTIONS: None  RED FLAGS: None   WEIGHT BEARING RESTRICTIONS: No  FALLS: Has patient fallen in last 6 months? No  LIVING ENVIRONMENT: Lives with: lives with their spouse Lives in: House/apartment  PLOF: Independent  PATIENT GOALS: improve balance and dizziness  OBJECTIVE:  Note: Objective measures were completed at Evaluation unless otherwise noted.  DIAGNOSTIC FINDINGS: IMPRESSION: Normal MRI scan of the brain with and without contrast and thin sections through internal auditory canals.  IMPRESSION: Normal CT of the temporal bones.  COGNITION: Overall cognitive status: Within functional limits for tasks assessed   SENSATION: WFL   Cervical ROM:  WNL's   GAIT: Gait pattern: WFL Distance walked: 100' Assistive device utilized: None Level of assistance: Complete Independence Comments: no unsteadiness noted - pt reports today is a good day for her with minimal symptoms  FUNCTIONAL TESTS:  Functional gait assessment: 28/30 MCTSIB: Condition 1: Avg of 3 trials: 30 sec, Condition 2: Avg of 3 trials: 30 sec, Condition 3: Avg of 3 trials: 30 sec, Condition 4: Avg of 3 trials: 30 sec, and Total Score: 120/120    11/05/23 0001  Functional Gait  Assessment  Gait assessed  Yes  Gait Level Surface 3;   3  Change in Gait Speed 3     3  Gait with Horizontal Head Turns 2   2  Gait with Vertical Head Turns 3   3  Gait and Pivot Turn 3   3  Step Over Obstacle 3   3  Gait with Narrow Base of Support 3   3  Gait with Eyes Closed 2    3  Ambulating Backwards 3   3  Steps 3   3  Total Score 28/  29 on 01-28-24     VESTIBULAR ASSESSMENT:  GENERAL OBSERVATION: pt is a 58 yr old lady amb. Independently without device -    SYMPTOM BEHAVIOR:  Subjective history:  pt reports initial onset approx. 3 yrs ago - feels like symptoms  and impaired balance are progressively getting worse - etiology is unknown; pt has been referred to Dr. Karis (no appt as of yet) and also referred to [facility]  Non-Vestibular symptoms: changes in vision, headaches, nausea/vomiting, and Dec. 2024 -  recent flare up  Type of dizziness: Imbalance (Disequilibrium), Unsteady with head/body turns, and Funny feeling in the head  Frequency: daily - varies in intensity  Duration:  ongoing  Aggravating factors: Spontaneous, Induced by motion: bending down to the ground and turning body quickly, Worse with fatigue, Worse in the dark, and Worse outside or in busy environment  Relieving factors: avoid busy/distracting environments and adjusts activity  Progression of symptoms: worse  OCULOMOTOR EXAM:  Ocular Alignment: normal  Ocular ROM: No Limitations  Spontaneous Nystagmus: absent  Gaze-Induced Nystagmus: absent  Smooth Pursuits: intact and mild nystagmus jumping noted with horizontal smooth pursuits  Saccades: intact   VESTIBULAR - OCULAR REFLEX:    Dynamic Visual Acuity: Static: line 9 Dynamic: line 5 with 1 incorrect letter read   POSITIONAL TESTING: Not tested based on symptoms  MOTION SENSITIVITY:  No dizziness reported in today's eval - pt reports more imbalance  Motion Sensitivity Quotient Intensity: 0 = none, 1 = Lightheaded, 2 = Mild, 3 = Moderate, 4 = Severe, 5 = Vomiting  Intensity  1. Sitting to supine   2. Supine to L side   3. Supine to R side   4. Supine to sitting   5. L Hallpike-Dix   6. Up from L    7. R Hallpike-Dix   8. Up from R    9. Sitting, head tipped to L knee   10. Head up from L knee   11. Sitting, head tipped to R knee   12. Head up from R knee   13. Sitting head turns x5   14.Sitting head nods x5   15. In stance, 180 turn to L    16. In stance, 180 turn to R                                                                                                                               TREATMENT  DATE: 01-28-24  NeuroRe-ed:  Sensory Organization Test: previously assessed on 12-08-23 Composite score 90/100 (WNL's) N= 70/100  (score 92/100 on 12-08-23) Somatosensory, Visual and Vestibular inputs are all above Norms; preference is above age-related norm   SVA - line 9:  DVA line 6 - was noticed that pt's eyeglasses are progressive lenses which could be impacting acuity of test and not an actual VOR impairment; pt reported no dizziness upon   Gait:  FGA score 29/30 (score 28/30 on 12-11-23)  Self Care;  discussed pt's symptoms as dizziness is of unknown etiology; reviewed LTG's - will keep pt on hold until after ENT appt with Dr. Karis Pt has appt with Dr. Karis next Wed., 02-03-24  PATIENT EDUCATION: Education details: x1 viewing; balance on foam not issued due to vestibular input WNL's per SOT Person educated: Patient Education method: Explanation, Demonstration, and Handouts Education comprehension: verbalized understanding and returned demonstration  HOME EXERCISE PROGRAM: x1 viewing exercise issued on 11-05-23 Tip Card  1.The goal of habituation training is to assist in decreasing symptoms of vertigo, dizziness, or nausea provoked by specific head and body motions. 2.These exercises may initially increase symptoms; however, be persistent and work through symptoms. With repetition and time, the exercises will assist in reducing or eliminating symptoms. 3.Exercises should be stopped and discussed with the therapist if you experience any of the following: - Sudden change or fluctuation in hearing - New onset of ringing in the ears, or increase in current intensity - Any  fluid discharge from the ear - Severe pain in neck or back - Extreme nausea  Copyright  VHI. All rights reserved.    Gaze Stabilization: Standing Feet Apart    Feet shoulder width apart, keeping eyes on target on wall _5___ feet away, tilt head down 15-30 and move head side to side for _60___ seconds. Repeat while  moving head up and down for __60__ seconds. Do __3-5__ sessions per day. Repeat using target on pattern background.     GOALS: Goals reviewed with patient? Yes  SHORT TERM GOALS: same as LTG's   LONG TERM GOALS: Target date: 12-18-23  Perform SOT and establish goal as appropriate.  Baseline: score WNL's so no LTG needed Goal status: Goal met 12-11-23  2.  Independent in HEP for balance/vestibular exercises. Baseline:  Goal status: IN PROGRESS  3.  Complete DHI and establish goal as appropriate. Baseline: score 18% Goal status: Goal deferred 12-11-23 due to score of only 18%  ASSESSMENT:  CLINICAL IMPRESSION: PT session focused on vestibular reassessment including SOT and FGA.   Pt's composite score is well above age related norm of 70/100 with her score 90/100.  All sensory inputs including somatosensory, visual and vestibular inputs are all WNL's.  Pt's DVA has improved by 1 line since assessment on 12-11-23, from 4 line difference to currently a 3 line difference.   Pt's FGA score has improved from 28/30 on 12-11-23 to 29/30 in today's session.  Pt stated she felt her symptoms had increased since discontinuing the steroids but no changes in vestibular function noted in today's session compared with performance in previous session on 12-11-23.  Pt reported no dizziness in today's session.  Will place pt on HOLD until appt with Dr. Karis on 02-03-24; will resume PT if so ordered by MD based on findings at that appt.  Pt agrees with this plan.    OBJECTIVE IMPAIRMENTS: decreased balance and dizziness.   ACTIVITY LIMITATIONS: None - pt reports she is functional   PARTICIPATION LIMITATIONS: N/A  PERSONAL FACTORS: Time since onset of injury/illness/exacerbation and unknown etiology of diagnosis are also affecting patient's functional outcome.   REHAB POTENTIAL: Good  CLINICAL DECISION MAKING: Evolving/moderate complexity  EVALUATION COMPLEXITY: Moderate   PLAN:  PT FREQUENCY:  1x/week  PT DURATION: 4 weeks  PLANNED INTERVENTIONS: 97110-Therapeutic exercises, 97530- Therapeutic activity, [id number]- Neuromuscular re-education, [phone]- Self Care, [id number]- Gait training, and [phone]- Canalith repositioning  PLAN FOR NEXT SESSION:    place on hold for 30 days with pt to call for appt if dizziness re-occurs; pt to see Dr. Karis on 02-03-24   Danali Marinos, [place], PT 01/30/2024, 6:11 PM

## 2024-01-29 ENCOUNTER — Other Ambulatory Visit (HOSPITAL_COMMUNITY): Payer: Self-pay

## 2024-01-30 ENCOUNTER — Encounter: Payer: Self-pay | Admitting: Physical Therapy

## 2024-02-03 ENCOUNTER — Encounter (INDEPENDENT_AMBULATORY_CARE_PROVIDER_SITE_OTHER): Payer: Self-pay | Admitting: Otolaryngology

## 2024-02-03 ENCOUNTER — Ambulatory Visit (INDEPENDENT_AMBULATORY_CARE_PROVIDER_SITE_OTHER): Admitting: Otolaryngology

## 2024-02-03 ENCOUNTER — Other Ambulatory Visit (HOSPITAL_COMMUNITY): Payer: Self-pay

## 2024-02-03 ENCOUNTER — Other Ambulatory Visit: Payer: Self-pay

## 2024-02-03 ENCOUNTER — Emergency Department (HOSPITAL_COMMUNITY)
Admission: EM | Admit: 2024-02-03 | Discharge: 2024-02-03 | Disposition: A | Discharge status: Home / Self Care | Attending: Emergency Medicine | Admitting: Emergency Medicine

## 2024-02-03 ENCOUNTER — Encounter (HOSPITAL_COMMUNITY): Payer: Self-pay

## 2024-02-03 VITALS — BP 124/81 | HR 76

## 2024-02-03 DIAGNOSIS — H9209 Otalgia, unspecified ear: Secondary | ICD-10-CM

## 2024-02-03 DIAGNOSIS — H9201 Otalgia, right ear: Secondary | ICD-10-CM

## 2024-02-03 DIAGNOSIS — Z23 Encounter for immunization: Secondary | ICD-10-CM | POA: Diagnosis not present

## 2024-02-03 DIAGNOSIS — S59911A Unspecified injury of right forearm, initial encounter: Secondary | ICD-10-CM | POA: Diagnosis present

## 2024-02-03 DIAGNOSIS — Z794 Long term (current) use of insulin: Secondary | ICD-10-CM | POA: Insufficient documentation

## 2024-02-03 DIAGNOSIS — W540XXA Bitten by dog, initial encounter: Secondary | ICD-10-CM | POA: Insufficient documentation

## 2024-02-03 DIAGNOSIS — S51801A Unspecified open wound of right forearm, initial encounter: Secondary | ICD-10-CM | POA: Diagnosis not present

## 2024-02-03 DIAGNOSIS — R42 Dizziness and giddiness: Secondary | ICD-10-CM | POA: Diagnosis not present

## 2024-02-03 DIAGNOSIS — Z7982 Long term (current) use of aspirin: Secondary | ICD-10-CM | POA: Insufficient documentation

## 2024-02-03 MED ORDER — TETANUS-DIPHTH-ACELL PERTUSSIS 5-2.5-18.5 LF-MCG/0.5 IM SUSY
0.5000 mL | PREFILLED_SYRINGE | Freq: Once | INTRAMUSCULAR | Status: DC
  Filled 2024-02-03: qty 0.5

## 2024-02-03 MED ORDER — TETANUS-DIPHTHERIA TOXOIDS TD 5-2 LFU IM INJ
0.5000 mL | INJECTION | Freq: Once | INTRAMUSCULAR | Status: AC
  Administered 2024-02-03: 0.5 mL via INTRAMUSCULAR
  Filled 2024-02-03: qty 0.5

## 2024-02-03 MED ORDER — BACITRACIN ZINC 500 UNIT/GM EX OINT
TOPICAL_OINTMENT | CUTANEOUS | Status: AC
  Filled 2024-02-03: qty 0.9

## 2024-02-03 MED ORDER — AMOXICILLIN-POT CLAVULANATE 875-125 MG PO TABS
1.0000 | ORAL_TABLET | Freq: Once | ORAL | Status: AC
  Administered 2024-02-03: 1 via ORAL
  Filled 2024-02-03: qty 1

## 2024-02-03 MED ORDER — AMOXICILLIN-POT CLAVULANATE 875-125 MG PO TABS
1.0000 | ORAL_TABLET | Freq: Two times a day (BID) | ORAL | 0 refills | Status: DC
Start: 2024-02-03 — End: 2024-03-21
  Filled 2024-02-03 (×3): qty 20, 10d supply, fill #0

## 2024-02-03 NOTE — Discharge Instructions (Addendum)
 Keep the wound clean, apply topical antibiotic. Take the Augmentin  twice daily for the next 10 days.   Follow up for recheck with any sign of infection - increasing pain or redness, fever, drainage from the wound.

## 2024-02-03 NOTE — ED Triage Notes (Signed)
 POV/ dog bite to right forearm/ pts states her german shepard bit her, kind of provoked / pts dog is UTD on vaccines/ pts tetanus is not UTD/ bleeding controlled/ takes ASA 81mg / A&OX4

## 2024-02-03 NOTE — Progress Notes (Unsigned)
 CC: Chronic dizziness, right ear pain  HPI:  Robin Maynard is a 58 y.o. female who presents today for her initial consultation. - Presents for evaluation of her chronic dizziness and right ear pain.  She has been symptomatic for three to four years.  She is describes her dizziness as an off-balance and lightheaded sensation.  She denies any true spinning vertigo. - The balance issues are intermittent and are associated with pain in the right ear. The pain can radiate up the right side of the face and down the right side of the neck. The symptoms are consistently unilateral on the right side. - The episodes of pain and imbalance can last for an entire month, occurring daily. When an episode begins, the symptoms are constant and can persist for at least a week at a time. - Steroids provide significant relief from the symptoms. Reports feeling great while on steroids, but the pain and balance issues returned with increased intensity after discontinuing the medication. Physical therapy has been of limited benefit; attended three sessions, but the variability of symptoms made it difficult for the therapist to establish a consistent treatment plan.  - Reports a history of migraines, but a trial of Ajovy  (a monthly injection) did not alleviate the ear pain or balance issues and, in fact, worsened headaches, causing a new, persistent pounding headache for the entire month. - Reports a history of herpetic blisters on the right side of her face and inside the nose, which has been an issue for years. Has been on a maintenance dose of Valtrex  2 grams daily for approximately two years. This has prevented the formation of new sores but has not resolved the underlying pain and balance symptoms. - Reports a new symptom of losing peripheral vision in the superior field of the right eye, which has been noticed over the last year and a half. - Associated symptoms include a constant, low-level white noise tinnitus,  which is more prominent in the right ear than the left. Denies any change in hearing, ear pressure, or true spinning vertigo. Reports having fallen twice in June. - Previous workup for these symptoms includes a normal MRI scan and a CT scan of the temporal bones, which ruled out a brain tumor. - Has been evaluated by a neurologist, Dr. Ines, who systematically worked up conditions including Parkinson's disease and migraines, and ordered the aforementioned imaging. Dr. Ines does not believe the symptoms are migraine-related. A previous neurology referral to [facility] was unsatisfactory due to a lack of continuity of care in a resident clinic. - A hearing test performed at [facility] was normal. Dr. Jesus suspected a neck issue, but a CT of the neck was normal, and a neurosurgeon ruled out a cervical spine etiology. - Surgical history includes tonsillectomy and adenoidectomy (T&A) and placement of ear tubes as a child. No other ear, nose, or throat surgeries. - Currently sees an eye doctor regularly.   Past Medical History:  Diagnosis Date   Acute pancreatitis    Autoimmune thyroiditis    Constipation    Diverticulitis    Gastritis    GERD (gastroesophageal reflux disease)    GI work up in progress   Hashimoto's thyroiditis    Headache    Heartburn    Herpes    Joint pain    Mass on back    Obesity    Pancreatic disease    PONV (postoperative nausea and vomiting)    Rheumatoid arthritis (HCC)    SOBOE (  shortness of breath on exertion)    Thyroid  condition    Vitamin D  deficiency     Past Surgical History:  Procedure Laterality Date   COLONOSCOPY     DILITATION & CURRETTAGE/HYSTROSCOPY WITH HYDROTHERMAL ABLATION N/A 12/26/2016   Procedure: DILATATION & CURETTAGE/HYSTEROSCOPY WITH HYDROTHERMAL ABLATION;  Surgeon: Mat Browning, MD;  Location: [facility];  Service: Gynecology;  Laterality: N/A;   MASS EXCISION Right 02/23/2018   Procedure: EXCISION BACK MASS  ERAS PATHWAY;  Surgeon: Ebbie Cough, MD;  Location: [facility];  Service: General;  Laterality: Right;   POLYPECTOMY     TONSILLECTOMY AND ADENOIDECTOMY     TYMPANOSTOMY TUBE PLACEMENT      Family History  Problem Relation Age of Onset   Deep vein thrombosis Mother    Colon polyps Mother    Heart disease Mother    Depression Mother    Anxiety disorder Mother    Schizophrenia Mother    Obesity Mother    Cancer Father    Hyperlipidemia Father    Gastric cancer Father    Diabetes Father    Heart disease Father    Hypertension Father    Stomach cancer Father    Breast cancer Maternal Aunt    Diabetes Paternal Grandmother    Heart disease Paternal Grandmother    Aortic stenosis Son    Colon cancer Neg Hx    Allergic rhinitis Neg Hx    Asthma Neg Hx    Angioedema Neg Hx    Eczema Neg Hx    Urticaria Neg Hx    Rectal cancer Neg Hx    Esophageal cancer Neg Hx    Inflammatory bowel disease Neg Hx    Liver disease Neg Hx    Pancreatic cancer Neg Hx     Social History:  reports that she has never smoked. She has been exposed to tobacco smoke. She has never used smokeless tobacco. She reports that she does not drink alcohol  and does not use drugs.  Allergies:  Allergies  Allergen Reactions   Doxycycline Nausea And Vomiting   Flulaval Quadrivalent [Influenza Vac Split Quad] Other (See Comments)    Weakness and don't feel good from the vaccine   Hydroxychloroquine Other (See Comments)    Nausea/malaise/reflux     Prior to Admission medications   Medication Sig Start Date End Date Taking? Authorizing Provider  albuterol  (VENTOLIN  HFA) 108 (90 Base) MCG/ACT inhaler Inhale 2 puffs every 6 (six) hours as needed for wheezing. 09/01/23  Yes   aspirin  EC (ASPIRIN  81) 81 MG tablet Take 1 tablet by mouth once daily 10/01/22  Yes   Cholecalciferol (VITAMIN D3) 50 MCG (2000 UT) capsule Take 1 capsule (2,000 Units total) by mouth daily. 07/06/23  Yes Trixie File,  MD  ciclopirox  (PENLAC ) 8 % solution Apply topically at bedtime. Apply over nail and surrounding skin. Apply daily over previous coat. After seven (7) days, may remove with alcohol  and continue cycle. 11/25/23  Yes   Fremanezumab -vfrm (AJOVY ) 225 MG/1.5ML SOAJ Inject 225 mg into the skin every 30 (thirty) days. 07/23/23  Yes Ines Onetha NOVAK, MD  hyoscyamine  (LEVSIN  SL) 0.125 MG SL tablet Place 1 tablet (0.125 mg total) under the tongue every 4 (four) hours as needed. 11/11/23  Yes Mansouraty, Aloha Raddle., MD  Magnesium  Gluconate 550 MG TABS Take 550 mg by mouth. 2 to 3 times a week   Yes [provider]  Multiple Vitamin (MULTI-VITAMIN) tablet Take 1 tablet by mouth  daily.   Yes [provider]  Omega-3 1000 MG CAPS Take by mouth.   Yes [provider]  omeprazole  (PRILOSEC) 40 MG capsule Take 1 capsule (40 mg total) by mouth daily. 08/18/23  Yes Mansouraty, Aloha Raddle., MD  rosuvastatin  (CRESTOR ) 5 MG tablet Take 1 tablet (5 mg total) by mouth daily. 08/17/23  Yes   Semaglutide -Weight Management (WEGOVY ) 0.25 MG/0.5ML SOAJ Inject 0.25 mg into the skin once a week. 12/19/23  Yes   sucralfate  (CARAFATE ) 1 g tablet Take 1 tablet (1 g total) by mouth 2 (two) times daily. 08/18/23  Yes Mansouraty, Aloha Raddle., MD  sulfaSALAzine  (AZULFIDINE ) 500 MG EC tablet Take 1 tablet (500 mg total) by mouth daily. 12/18/23  Yes   Turmeric 400 MG CAPS See admin instructions.   Yes [provider]  valACYclovir  (VALTREX ) 1000 MG tablet Take 1 tablet (1,000 mg total) by mouth 2 (two) times daily. 09/15/23  Yes   valACYclovir  (VALTREX ) 1000 MG tablet Take 1 tablet (1,000 mg total) by mouth 2 (two) times daily. 12/08/23  Yes   zolpidem  (AMBIEN ) 10 MG tablet Take 1 tablet (10 mg total) by mouth at bedtime. Patient taking differently: Take 10 mg by mouth as needed. 10/05/22  Yes   celecoxib  (CELEBREX ) 50 MG capsule Take 2 capsules (100 mg total) by mouth 2 (two)  times daily as needed for  Pain. Patient not taking: Reported on 02/03/2024 01/24/22       Blood pressure 124/81, pulse 76, SpO2 96%. Exam: General: Communicates without difficulty, well nourished, no acute distress. Head: Normocephalic, no evidence injury, no tenderness, facial buttresses intact without stepoff. Face/sinus: No tenderness to palpation and percussion. Facial movement is normal and symmetric. Eyes: PERRL, EOMI. No scleral icterus, conjunctivae clear. Neuro: CN II exam reveals vision grossly intact.  No nystagmus at any point of gaze. Ears: Auricles well formed without lesions.  Ear canals are intact without mass or lesion.  No erythema or edema is appreciated.  The TMs are intact without fluid. Nose: External evaluation reveals normal support and skin without lesions.  Dorsum is intact.  Anterior rhinoscopy reveals congested mucosa over anterior aspect of inferior turbinates and intact septum.  No purulence noted. Oral:  Oral cavity and oropharynx are intact, symmetric, without erythema or edema.  Mucosa is moist without lesions. Neck: Full range of motion without pain.  There is no significant lymphadenopathy.  No masses palpable.  Thyroid  bed within normal limits to palpation.  Parotid glands and submandibular glands equal bilaterally without mass.  Trachea is midline. Neuro:  CN 2-12 grossly intact. Vestibular: No nystagmus at any point of gaze. Dix Hallpike negative. Vestibular: There is no nystagmus with pneumatic pressure on either tympanic membrane or Valsalva. The cerebellar examination is unremarkable.   Assessment: - The clinical presentation with balance disturbance, referred otalgia, and facial/neck pain, in the absence of spinning vertigo or hearing loss, makes a primary vestibular disorder such as Meniere's disease or BPPV less likely. The symptoms are more suggestive of a neurologic or neuropathic process. The recent onset of peripheral vision loss on the same side is also concerning for a neurologic  etiology. - Her ear canals, tympanic membranes, and middle ear spaces are normal.  Her Dix-Hallpike maneuver is negative.  Plan: - The physical exam findings are extensively reviewed with the patient. - The pathophysiology of vestibular dysfunction and dizziness are also discussed extensively with the patient. Questions are invited and answered.   - The patient may benefit from  a neurologic consultation at a tertiary care cente for further evaluation and treatment of her complex neurologic symptoms, particularly given the lack of response to previous treatments and the uncontrolled headache/pain syndrome. - It was also recommended to be evaluated by a neuro-ophthalmologist for the new-onset peripheral vision loss. - The importance of ruling out other causes for the symptoms was discussed, and it was explained that while an inner ear cause is unlikely, it cannot be definitively excluded. The plan to seek further specialist consultation was documented as requested.  Shivali Quackenbush W Gilman Olazabal 02/03/2024, 1:45 PM

## 2024-02-03 NOTE — ED Notes (Signed)
 Pt voices concern of not getting proper treatment/ provider made aware/ pt told Brie, Paramedic that she is out of her scope to care for me/ pt refused this nurse to care for wound and pt poured wound wash on wound herself and cleaned with non-gloved hands/ pt was given sterile gauze to clean wound/ pt wrapped wound herself.

## 2024-02-03 NOTE — ED Provider Notes (Signed)
 [place] EMERGENCY DEPARTMENT AT [facility] Provider Note   CSN: [id number] Arrival date & time: 02/03/24  1427     Patient presents with: Animal Bite   Robin Maynard is a 58 y.o. female.   Patient to ED after being bitten by her dog earlier today. The dog is current on rabies immunization. Bite is limited to the right volar forearm. Patient's tetanus is not UTD.  The history is provided by the patient. No language interpreter was used.  Animal Bite      Prior to Admission medications   Medication Sig Start Date End Date Taking? Authorizing Provider  amoxicillin -clavulanate (AUGMENTIN ) 875-125 MG tablet Take 1 tablet by mouth every 12 (twelve) hours. 02/03/24  Yes Odell Balls, PA-C  albuterol  (VENTOLIN  HFA) 108 (90 Base) MCG/ACT inhaler Inhale 2 puffs every 6 (six) hours as needed for wheezing. 09/01/23     aspirin  EC (ASPIRIN  81) 81 MG tablet Take 1 tablet by mouth once daily 10/01/22     celecoxib  (CELEBREX ) 50 MG capsule Take 2 capsules (100 mg total) by mouth 2 (two)  times daily as needed for Pain. Patient not taking: Reported on 02/03/2024 01/24/22     Cholecalciferol (VITAMIN D3) 50 MCG (2000 UT) capsule Take 1 capsule (2,000 Units total) by mouth daily. 07/06/23   Trixie File, MD  ciclopirox  (PENLAC ) 8 % solution Apply topically at bedtime. Apply over nail and surrounding skin. Apply daily over previous coat. After seven (7) days, may remove with alcohol  and continue cycle. 11/25/23     Fremanezumab -vfrm (AJOVY ) 225 MG/1.5ML SOAJ Inject 225 mg into the skin every 30 (thirty) days. 07/23/23   Ines Onetha NOVAK, MD  hyoscyamine  (LEVSIN  SL) 0.125 MG SL tablet Place 1 tablet (0.125 mg total) under the tongue every 4 (four) hours as needed. 11/11/23   Mansouraty, Aloha Raddle., MD  Magnesium  Gluconate 550 MG TABS Take 550 mg by mouth. 2 to 3 times a week    [provider]  Multiple Vitamin (MULTI-VITAMIN) tablet Take 1 tablet by mouth daily.    [provider]  Omega-3 1000 MG CAPS Take by mouth.    [provider]  omeprazole  (PRILOSEC) 40 MG capsule Take 1 capsule (40 mg total) by mouth daily. 08/18/23   Mansouraty, Aloha Raddle., MD  rosuvastatin  (CRESTOR ) 5 MG tablet Take 1 tablet (5 mg total) by mouth daily. 08/17/23     Semaglutide -Weight Management (WEGOVY ) 0.25 MG/0.5ML SOAJ Inject 0.25 mg into the skin once a week. 12/19/23     sucralfate  (CARAFATE ) 1 g tablet Take 1 tablet (1 g total) by mouth 2 (two) times daily. 08/18/23   Mansouraty, Aloha Raddle., MD  sulfaSALAzine  (AZULFIDINE ) 500 MG EC tablet Take 1 tablet (500 mg total) by mouth daily. 12/18/23     Turmeric 400 MG CAPS See admin instructions.    [provider]  valACYclovir  (VALTREX ) 1000 MG tablet Take 1 tablet (1,000 mg total) by mouth 2 (two) times daily. 09/15/23     valACYclovir  (VALTREX ) 1000 MG tablet Take 1 tablet (1,000 mg total) by mouth 2 (two) times daily. 12/08/23     zolpidem  (AMBIEN ) 10 MG tablet Take 1 tablet (10 mg total) by mouth at bedtime. Patient taking differently: Take 10 mg by mouth as needed. 10/05/22       Allergies: Doxycycline, Flulaval quadrivalent [influenza vac split quad], and Hydroxychloroquine    Review of Systems  Updated Vital Signs BP 130/73 (BP Location: Left Arm)   Pulse  80   Temp 98.5 F (36.9 C) (Oral)   Resp 18   SpO2 96%   Physical Exam Constitutional:      General: She is not in acute distress.    Appearance: She is well-developed. She is not ill-appearing.  Pulmonary:     Effort: Pulmonary effort is normal.  Musculoskeletal:        General: Normal range of motion.     Cervical back: Normal range of motion.     Comments: Partial thickness, linear wound to midshaft volar forearm. Multiple adjacent scratches. Minimal bleeding. No surrounding swelling. No bruising.   Skin:    General: Skin is warm and dry.  Neurological:     Mental Status: She is alert and oriented to person, place, and time.     (all  labs ordered are listed, but only abnormal results are displayed) Labs Reviewed - No data to display  EKG: None  Radiology: No results found.   Procedures   Medications Ordered in the ED  bacitracin  500 UNIT/GM ointment (has no administration in time range)  tetanus & diphtheria toxoids (adult) (TENIVAC ) injection 0.5 mL (has no administration in time range)  amoxicillin -clavulanate (AUGMENTIN ) 875-125 MG per tablet 1 tablet (1 tablet Oral Given 02/03/24 1511)    Clinical Course as of 02/03/24 1518  Wed Feb 03, 2024  1509 Dog bite wounds to right forearm. No repair indicated. Augmentin  started, tetanus updated. Rabies series is not indicated - dog is known and current.   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 OOW note provided.  [SU]  1517 Patient reports an adverse reaction to pertussis. Discussed with pharmacy. Tetanus, diptheria in stock and is appropriate replacement.  [SU]    Clinical Course User Index [SU] Odell Balls, PA-C                                 Medical Decision Making Risk Prescription drug management.        Final diagnoses:  Dog bite, initial encounter    ED Discharge Orders          Ordered    amoxicillin -clavulanate (AUGMENTIN ) 875-125 MG tablet  Every 12 hours        02/03/24 1454               Odell Balls, PA-C 02/03/24 1518    Levander Houston, MD 02/11/24 1013

## 2024-02-03 NOTE — ED Notes (Signed)
 Pt refused vitals

## 2024-02-04 DIAGNOSIS — R42 Dizziness and giddiness: Secondary | ICD-10-CM | POA: Insufficient documentation

## 2024-02-04 DIAGNOSIS — H9201 Otalgia, right ear: Secondary | ICD-10-CM | POA: Insufficient documentation

## 2024-02-09 NOTE — Progress Notes (Signed)
 [facility] - Clinic Note  02/17/2024     CHIEF COMPLAINT Patient presents for Retina Follow Up   HISTORY OF PRESENT ILLNESS: Robin Maynard is a 58 y.o. female who presents to the clinic today for:   HPI     Retina Follow Up   Patient presents with  Other.  In both eyes.  Severity is moderate.  Duration of 12 months.  Since onset it is stable.  I, the attending physician,  performed the HPI with the patient and updated documentation appropriately.        Comments   Pt here for 12 mo ret f/u for CR scarring OU. Pt states she can tell theres been some peripheral loss in OD. Has new rx specs.       Last edited by Valdemar Rogue, MD on 02/21/2024 12:00 AM.     Pt states she has some loss of peripheral vision ST, she states Dr. Kennyth has tested her peripheral vision several times and the spot has stayed consistent, she states she has some discomfort / pain with balance issues but no one can figure out what is going on, she states her ENT wants her to go to [facility], pt states she has been talking to her mom and her peripheral vision is very diminished, so pt is worried her vision will go down the same path  Referring physician: Kennyth Cy RAMAN, DO [address] [place],  [place] [address]  HISTORICAL INFORMATION:   Selected notes from the MEDICAL RECORD NUMBER Referred by Dr. Kennyth LEE:  Ocular Hx- PMH-    CURRENT MEDICATIONS: No current outpatient medications on file. (Ophthalmic Drugs)   No current facility-administered medications for this visit. (Ophthalmic Drugs)   Current Outpatient Medications (Other)  Medication Sig   albuterol  (VENTOLIN  HFA) 108 (90 Base) MCG/ACT inhaler Inhale 2 puffs every 6 (six) hours as needed for wheezing.   aspirin  EC (ASPIRIN  81) 81 MG tablet Take 1 tablet by mouth once daily   celecoxib  (CELEBREX ) 50 MG capsule Take 2 capsules (100 mg total) by mouth 2 (two)  times daily as needed for Pain.   Cholecalciferol (VITAMIN  D3) 50 MCG (2000 UT) capsule Take 1 capsule (2,000 Units total) by mouth daily.   ciclopirox  (PENLAC ) 8 % solution Apply topically at bedtime. Apply over nail and surrounding skin. Apply daily over previous coat. After seven (7) days, may remove with alcohol  and continue cycle.   hyoscyamine  (LEVSIN  SL) 0.125 MG SL tablet Place 1 tablet (0.125 mg total) under the tongue every 4 (four) hours as needed.   Magnesium  Gluconate 550 MG TABS Take 550 mg by mouth. 2 to 3 times a week   Multiple Vitamin (MULTI-VITAMIN) tablet Take 1 tablet by mouth daily.   Omega-3 1000 MG CAPS Take by mouth.   omeprazole  (PRILOSEC) 40 MG capsule Take 1 capsule (40 mg total) by mouth daily.   rosuvastatin  (CRESTOR ) 5 MG tablet Take 1 tablet (5 mg total) by mouth daily. (Please call office to schedule appointment and labs)   Semaglutide -Weight Management (WEGOVY ) 0.25 MG/0.5ML SOAJ Inject 0.25 mg into the skin once a week.   Turmeric 400 MG CAPS See admin instructions.   valACYclovir  (VALTREX ) 1000 MG tablet Take 1 tablet (1,000 mg total) by mouth 2 (two) times daily.   valACYclovir  (VALTREX ) 1000 MG tablet Take 1 tablet (1,000 mg total) by mouth 2 (two) times daily.   zolpidem  (AMBIEN ) 10 MG tablet Take 1 tablet (10  mg total) by mouth at bedtime.   amoxicillin -clavulanate (AUGMENTIN ) 875-125 MG tablet Take 1 tablet by mouth every 12 (twelve) hours. (Patient not taking: Reported on 02/17/2024)   Fremanezumab -vfrm (AJOVY ) 225 MG/1.5ML SOAJ Inject 225 mg into the skin every 30 (thirty) days. (Patient not taking: Reported on 02/17/2024)   sucralfate  (CARAFATE ) 1 g tablet Take 1 tablet (1 g total) by mouth 2 (two) times daily. (Patient not taking: Reported on 02/17/2024)   sulfaSALAzine  (AZULFIDINE ) 500 MG EC tablet Take 1 tablet (500 mg total) by mouth daily. (Patient not taking: Reported on 02/17/2024)   No current facility-administered medications for this visit. (Other)   REVIEW OF SYSTEMS: ROS   Positive for:  Musculoskeletal, Endocrine, Eyes Negative for: Constitutional, Gastrointestinal, Neurological, Skin, Genitourinary, HENT, Cardiovascular, Respiratory, Psychiatric, Allergic/Imm, Heme/Lymph Last edited by Antonetta Almetta BRAVO, COT on 02/17/2024  1:17 PM.     ALLERGIES Allergies  Allergen Reactions   Doxycycline Nausea And Vomiting   Flulaval Quadrivalent [Influenza Vac Split Quad] Other (See Comments)    Weakness and don't feel good from the vaccine   Hydroxychloroquine Other (See Comments)    Nausea/malaise/reflux    PAST MEDICAL HISTORY Past Medical History:  Diagnosis Date   Acute pancreatitis    Autoimmune thyroiditis    Constipation    Diverticulitis    Gastritis    GERD (gastroesophageal reflux disease)    GI work up in progress   Hashimoto's thyroiditis    Headache    Heartburn    Herpes    Joint pain    Mass on back    Obesity    Pancreatic disease    PONV (postoperative nausea and vomiting)    Rheumatoid arthritis (HCC)    SOBOE (shortness of breath on exertion)    Thyroid  condition    Vitamin D  deficiency    Past Surgical History:  Procedure Laterality Date   COLONOSCOPY     DILITATION & CURRETTAGE/HYSTROSCOPY WITH HYDROTHERMAL ABLATION N/A 12/26/2016   Procedure: DILATATION & CURETTAGE/HYSTEROSCOPY WITH HYDROTHERMAL ABLATION;  Surgeon: Mat Browning, MD;  Location: [facility];  Service: Gynecology;  Laterality: N/A;   MASS EXCISION Right 02/23/2018   Procedure: EXCISION BACK MASS ERAS PATHWAY;  Surgeon: Ebbie Cough, MD;  Location: [facility];  Service: General;  Laterality: Right;   POLYPECTOMY     TONSILLECTOMY AND ADENOIDECTOMY     TYMPANOSTOMY TUBE PLACEMENT     FAMILY HISTORY Family History  Problem Relation Age of Onset   Deep vein thrombosis Mother    Colon polyps Mother    Heart disease Mother    Depression Mother    Anxiety disorder Mother    Schizophrenia Mother    Obesity Mother    Cancer Father     Hyperlipidemia Father    Gastric cancer Father    Diabetes Father    Heart disease Father    Hypertension Father    Stomach cancer Father    Breast cancer Maternal Aunt    Diabetes Paternal Grandmother    Heart disease Paternal Grandmother    Aortic stenosis Son    Colon cancer Neg Hx    Allergic rhinitis Neg Hx    Asthma Neg Hx    Angioedema Neg Hx    Eczema Neg Hx    Urticaria Neg Hx    Rectal cancer Neg Hx    Esophageal cancer Neg Hx    Inflammatory bowel disease Neg Hx    Liver disease Neg Hx  Pancreatic cancer Neg Hx    SOCIAL HISTORY Social History   Tobacco Use   Smoking status: Never    Passive exposure: Current   Smokeless tobacco: Never  Vaping Use   Vaping status: Never Used  Substance Use Topics   Alcohol  use: No   Drug use: No       OPHTHALMIC EXAM:  Base Eye Exam     Visual Acuity (Snellen - Linear)       Right Left   Dist cc 20/25 -2 20/25 +2   Dist ph cc NI     Correction: Glasses         Tonometry (Tonopen, 1:24 PM)       Right Left   Pressure 15 14         Pupils       Pupils Dark Light Shape React APD   Right PERRL 3 2 Round Brisk None   Left PERRL 3 2 Round Brisk None         Visual Fields (Counting fingers)       Left Right    Full Full         Extraocular Movement       Right Left    Full, Ortho Full, Ortho         Neuro/Psych     Oriented x3: Yes   Mood/Affect: Normal         Dilation     Both eyes: 1.0% Mydriacyl, 2.5% Phenylephrine @ 1:25 PM           Slit Lamp and Fundus Exam     External Exam       Right Left   External Normal Normal         Slit Lamp Exam       Right Left   Lids/Lashes Normal Meibomian gland dysfunction   Conjunctiva/Sclera nasal pingeucula White and quiet   Cornea 1 +PEE, mild tear film debris 1 +PEE, mild tear film debris   Anterior Chamber deep, 1-2+fine cell and pigment Deep, 0.5+fine cell and pigment   Iris Round and dilated Round and dialted    Lens 2+ Nuclear sclerosis, 2+ Cortical cataract 2+ Nuclear sclerosis, 2+ Cortical cataract   Anterior Vitreous Mild syneresis, no cell, Vitreous condensations Vitreous syneresis, Vitreous condensations         Fundus Exam       Right Left   Disc Pink and sharp, +PPA Pink and sharp, mild temp PPA   C/D Ratio 0.2 0.2   Macula Flat, Good foveal reflex, No heme or edema Flat, Good foveal reflex, mild RPE mottling   Vessels mild tortuosity attenuated, mild tortuosity   Periphery attached; Focal CR atrophy 1030 midzone -- very faint; no heme attached; faint CR scar vs tuft at 0300 midzone -- stable, chorioretinal scar at 1030 -- no hole, no heme           Refraction     Wearing Rx       Sphere Cylinder Axis Add   Right -4.50 +1.00 107 +2.25   Left -4.75 +0.50 060 +2.25           IMAGING AND PROCEDURES  Imaging and Procedures for 02/17/2024  OCT, Retina - OU - Both Eyes       Right Eye Quality was good. Central Foveal Thickness: 278. Progression has been stable. Findings include normal foveal contour, no IRF, no SRF, myopic contour, vitreomacular adhesion .   Left Eye Quality was good.  Central Foveal Thickness: 271. Progression has been stable. Findings include normal foveal contour, no IRF, no SRF, myopic contour, vitreomacular adhesion .   Notes *Images captured and stored on drive  Diagnosis / Impression:  NFP; no IRF/SRF OU +VMA and +myopic contour OU  Clinical management:  See below  Abbreviations: NFP - Normal foveal profile. CME - cystoid macular edema. PED - pigment epithelial detachment. IRF - intraretinal fluid. SRF - subretinal fluid. EZ - ellipsoid zone. ERM - epiretinal membrane. ORA - outer retinal atrophy. ORT - outer retinal tubulation. SRHM - subretinal hyper-reflective material. IRHM - intraretinal hyper-reflective material            ASSESSMENT/PLAN:    ICD-10-CM   1. Chorioretinal scar of both eyes  H31.003 OCT, Retina - OU - Both Eyes     2. Vitreomacular adhesion of both eyes  H43.823     3. History of herpes simplex infection  Z86.19     4. Rheumatoid arthritis of multiple sites with negative rheumatoid factor (HCC)  M06.09     5. Combined forms of age-related cataract of both eyes  H25.813      1. Peripheral cystoid degneration / CR scarring and atrophy OU - stable -- no frank retinal tears, holes - FA 01.17.24: OD shows focal staining superotemporal periphery corresponding to CR scar; no leakage or vasculitis, OS shows focal staining temporal periphery; no leakage or vasculitis - discussed findings, prognosis, and treatment options including observation - no retinal or ophthalmic interventions indicated or recommended  - monitor - pt is cleared from a retina standpoint for release to Dr. Kennyth and resumption of primary eye care  2.  Vitreomacular Adhesion, OU  - OCT shows VMA OU -- stable.  - monitor  3. History of HSV -- multiple recurrences  - affected V2 dermatome  - currently on 1g TID po valtrex   - no active lesions  4. History of RA  - managed by Dr. Korene Fairly Rheumatololgy  - currently on MTX 10 mg + folic acid   - no evidence of uveitis OU on exam, imaging or fluorescein  angiography  - FA 01.17.24 -- no leakage or vasculitis OU  5. Combined forms aged related cataract OU - The symptoms of cataract, surgical options, and treatments and risks were discussed with patient. - discussed diagnosis and progression - monitor  Ophthalmic Meds Ordered this visit:  No orders of the defined types were placed in this encounter.    Return if symptoms worsen or fail to improve.  There are no Patient Instructions on file for this visit.   Explained the diagnoses, plan, and follow up with the patient and they expressed understanding.  Patient expressed understanding of the importance of proper follow up care.   This document serves as a record of services personally performed by Redell JUDITHANN Hans, MD, PhD.  It was created on their behalf by Almetta Pesa, an ophthalmic technician. The creation of this record is the provider's dictation and/or activities during the visit.    Electronically signed by: Almetta Pesa, OA, 02/21/24  12:03 AM  This document serves as a record of services personally performed by Redell JUDITHANN Hans, MD, PhD. It was created on their behalf by Alan PARAS. Delores, OA an ophthalmic technician. The creation of this record is the provider's dictation and/or activities during the visit.    Electronically signed by: Alan PARAS. Delores, OA 02/21/24 12:03 AM  Redell JUDITHANN Hans, M.D., Ph.D. Diseases & Surgery of the Retina and Vitreous  [facility]  I have reviewed the above documentation for accuracy and completeness, and I agree with the above. Redell JUDITHANN Hans, M.D., Ph.D. 02/21/24 12:03 AM   Abbreviations: M myopia (nearsighted); A astigmatism; H hyperopia (farsighted); P presbyopia; Mrx spectacle prescription;  CTL contact lenses; OD right eye; OS left eye; OU both eyes  XT exotropia; ET esotropia; PEK punctate epithelial keratitis; PEE punctate epithelial erosions; DES dry eye syndrome; MGD meibomian gland dysfunction; ATs artificial tears; PFAT's preservative free artificial tears; NSC nuclear sclerotic cataract; PSC posterior subcapsular cataract; ERM epi-retinal membrane; PVD posterior vitreous detachment; RD retinal detachment; DM diabetes mellitus; DR diabetic retinopathy; NPDR non-proliferative diabetic retinopathy; PDR proliferative diabetic retinopathy; CSME clinically significant macular edema; DME diabetic macular edema; dbh dot blot hemorrhages; CWS cotton wool spot; POAG primary open angle glaucoma; C/D cup-to-disc ratio; HVF humphrey visual field; GVF goldmann visual field; OCT optical coherence tomography; IOP intraocular pressure; BRVO Branch retinal vein occlusion; CRVO central retinal vein occlusion; CRAO central retinal artery occlusion; BRAO branch  retinal artery occlusion; RT retinal tear; SB scleral buckle; PPV pars plana vitrectomy; VH Vitreous hemorrhage; PRP panretinal laser photocoagulation; IVK intravitreal kenalog; VMT vitreomacular traction; MH Macular hole;  NVD neovascularization of the disc; NVE neovascularization elsewhere; AREDS age related eye disease study; ARMD age related macular degeneration; POAG primary open angle glaucoma; EBMD epithelial/anterior basement membrane dystrophy; ACIOL anterior chamber intraocular lens; IOL intraocular lens; PCIOL posterior chamber intraocular lens; Phaco/IOL phacoemulsification with intraocular lens placement; PRK photorefractive keratectomy; LASIK laser assisted in situ keratomileusis; HTN hypertension; DM diabetes mellitus; COPD chronic obstructive pulmonary disease

## 2024-02-10 ENCOUNTER — Other Ambulatory Visit: Payer: Self-pay

## 2024-02-10 ENCOUNTER — Other Ambulatory Visit (HOSPITAL_COMMUNITY): Payer: Self-pay

## 2024-02-10 MED ORDER — ROSUVASTATIN CALCIUM 5 MG PO TABS
5.0000 mg | ORAL_TABLET | Freq: Every day | ORAL | 0 refills | Status: DC
  Filled 2024-02-10: qty 30, 30d supply, fill #0

## 2024-02-17 ENCOUNTER — Encounter (INDEPENDENT_AMBULATORY_CARE_PROVIDER_SITE_OTHER): Payer: Self-pay | Admitting: Ophthalmology

## 2024-02-17 ENCOUNTER — Ambulatory Visit (INDEPENDENT_AMBULATORY_CARE_PROVIDER_SITE_OTHER): Payer: Managed Care, Other (non HMO) | Admitting: Ophthalmology

## 2024-02-17 DIAGNOSIS — H43823 Vitreomacular adhesion, bilateral: Secondary | ICD-10-CM

## 2024-02-17 DIAGNOSIS — M0609 Rheumatoid arthritis without rheumatoid factor, multiple sites: Secondary | ICD-10-CM

## 2024-02-17 DIAGNOSIS — H25813 Combined forms of age-related cataract, bilateral: Secondary | ICD-10-CM

## 2024-02-17 DIAGNOSIS — H31003 Unspecified chorioretinal scars, bilateral: Secondary | ICD-10-CM | POA: Diagnosis not present

## 2024-02-17 DIAGNOSIS — Z8619 Personal history of other infectious and parasitic diseases: Secondary | ICD-10-CM

## 2024-02-21 ENCOUNTER — Encounter (INDEPENDENT_AMBULATORY_CARE_PROVIDER_SITE_OTHER): Payer: Self-pay | Admitting: Ophthalmology

## 2024-02-23 ENCOUNTER — Encounter: Payer: Self-pay | Admitting: Neurology

## 2024-02-23 ENCOUNTER — Other Ambulatory Visit (HOSPITAL_COMMUNITY): Payer: Self-pay

## 2024-02-23 ENCOUNTER — Telehealth (HOSPITAL_COMMUNITY): Payer: Self-pay

## 2024-02-23 ENCOUNTER — Ambulatory Visit: Admitting: Neurology

## 2024-02-23 VITALS — BP 120/78 | HR 79 | Ht 64.0 in | Wt 186.8 lb

## 2024-02-23 DIAGNOSIS — H9191 Unspecified hearing loss, right ear: Secondary | ICD-10-CM | POA: Diagnosis not present

## 2024-02-23 DIAGNOSIS — H9311 Tinnitus, right ear: Secondary | ICD-10-CM | POA: Diagnosis not present

## 2024-02-23 DIAGNOSIS — H53453 Other localized visual field defect, bilateral: Secondary | ICD-10-CM | POA: Diagnosis not present

## 2024-02-23 DIAGNOSIS — H93A1 Pulsatile tinnitus, right ear: Secondary | ICD-10-CM

## 2024-02-23 MED ORDER — WEGOVY 0.5 MG/0.5ML ~~LOC~~ SOAJ
0.5000 mg | SUBCUTANEOUS | 3 refills | Status: AC
  Filled 2024-02-23 – 2024-02-26 (×4): qty 2, 28d supply, fill #0

## 2024-02-23 NOTE — Progress Notes (Signed)
 [id number]    Provider:  Dr Ines Requesting Provider: Rolinda Millman, MD Primary Care Provider:  Murleen Fine, MD  CC:  Imbalance, falls  02/23/2024: Absolutely lovely patient. Workup for causes of imbalance to date without etiology, MRI of the brain IAC protocol was normal, DaTscan  was negative for parkinsonian disorders, MRI March 2024 done for same reasons did not show any cause such as significant spinal stenosis or myelopathy in the cervical spine that would be symptomatic, Ajovy  did not help so unlikely vestibular migraines.  At last appointment we recommended she go to ENT, vestibular/physical therapy, consider Cymbalta  for possible PPPD and ordered CT of the temporal bones.  CT of the temporal bones was normal.  I reviewed physical therapy notes, patient last stated to [place] that she had 2 falls since June.  1 fall was when she was wearing flip-flops at great [facility] and 1 in front of the [place] when it was raining her right ankle turned in a little.  More imbalance and dizziness at this time.  Physical therapy noted no unsteadiness, complete independence.  She reported more feeling funny in the head.  Goal of PT and home exercise program for habituation training to assist in decreasing symptoms of vertigo.  Per assessment, her composite score was well above age-related norm of 70/100 with her score of 90/100, all sensory inputs including somatosensory, visual and vestibular inputs were all within normal limits.  Patient was also supposed to see Dr. Magdaline of ear nose and throat. Dr. Karis did not find anything.   Also with peripheral vision changes  Patient complains of symptoms per HPI as well as the following symptoms: per hpi . Pertinent negatives and positives per HPI. All others negative. No other focal neurologic deficits, associated symptoms, inciting events or modifiable factors.   10/20/2023: workup for causes of her imbalance to date  without etiology. MRI brain IAC protocol was normal . DAT scan negative for parkinsonian disorders. MRI 10/25/2022 done for the same reasons did not show any cause such as significant spinal stenosis or myelopathy that would be symptomatic.  Ajovy  did not help so unlikely vestibular migraines. She feels her right ear is affected. Pain is right underneath the ear lobe in a specific point. She has not seen ENT for a long time. In the dermatome of c2/c3 but that level was normal. The other option is occipital possibly. For the ear pain, ruled out clenching with dentist, saw an ENT several years ago not recently, TMs normal but she continues to have ear pain.    Reviewed notes, labs and imaging from outside physicians, which showed:  09/15/2023: Recent labwork showed: elevated crp, cmp normal, cbc with wbc 12.6 otherwise normal. Billirubin nml,   07/18/2023: MRi brain IAC protocol   IMPRESSION: Normal MRI scan of the brain with and without contrast and thin sections through internal auditory canals.   DAT Scan 08/20/2023 IMPRESSION: Ioflupane scan within normal limits. No reduced radiotracer activity in basal ganglia to suggest Parkinson's syndrome pathology.   Of note, DaTSCAN  is not diagnostic of Parkinsonian syndromes, which remains a clinical diagnosis. DaTscan  is an adjuvant test to aid in the clinical diagnosis of Parkinsonian syndromes.  HPI:  Robin Maynard is a 58 y.o. female here as requested by Rolinda Millman, MD for imbalance. No dizziness. has Mass of right lower leg; Mass on back; Acute pancreatitis; Constipation; Overweight (BMI 25.0-29.9); Abdominal pain, epigastric; Closed fracture of right clavicle; Other hyperlipidemia; Vitamin D  deficiency;  Pre-diabetes; Rheumatoid arthritis (HCC); Hypercholesterolemia; Prediabetes; Hashimoto's thyroiditis; H/o Low serum cortisol level; Dizziness; and Referred otalgia of right ear on their problem list.  She lost her sense of smell years ago  pre-covid. She cannot smell things, sometimes can catch a whiff. She woke up 3-4 years ago grabbing the walls for balance. Was not room spinning or dizziness. She hs a constant pain on the right temple, dull ache ache, pulsating, She has a hx of migraines. She does not have room spinning or an internal feeling of moevin but she has such bad balance she had to hold the walls, no lightheadedness, no CP, no pre-syncope, she went to ENT and she had a hearing test and a CT test found 50% carotid stenosis she followed up with vascular and is following. She sees Rhaumatologist and she has been the driving force for getting answers, rhaumatology may thik she has an inflammatory joint disease but they have never swabbed the lesions she gets. She is positive for HSV1. She has breakthroughs of lesions in the mouth, never enough to culture anything, valtrex  has helped still get sores but not open sores more like little rashes in her mouth and won;t even scab but used to scab, valtrex  1-2x a day every day. She has balance issues a lot, worse with the lesions, she is a Engineer, civil (consulting). The pain on the right side of the head is worse with imbalance. Imbalance is daily and not just with the imbalance. Imbalance does not get better with the valtrex  of decreased lesions. Gets the lesions with sun exposure and stress, symptoms better in the winter. The outbreak was on the face and she had a crazy pain radiating from the left nose to the temple. Her migraines are on the right, light sensitivity, nausea, pulsating/pounding/throbbing/unilateral on the right. Upper right quadrant vision loss.   Reviewed notes, labs and imaging from outside physicians, which showed:  02/17/2021: CLINICAL DATA:  Dizziness   EXAM: MRI HEAD WITHOUT CONTRAST   TECHNIQUE: Multiplanar, multiecho pulse sequences of the brain and surrounding structures were obtained without intravenous contrast.   COMPARISON:  CT head 02/17/2021   FINDINGS: Brain: No acute  infarction, hemorrhage, hydrocephalus, extra-axial collection or mass lesion. Normal cerebral white matter.   Vascular: Normal arterial flow voids   Skull and upper cervical spine: Negative   Sinuses/Orbits: Paranasal sinuses clear.  Negative orbit   Other: None   IMPRESSION: Negative MRI head without contrast.     Latest Ref Rng & Units 08/07/2023   11:48 AM 02/17/2021    1:35 PM 01/26/2019    9:09 AM  CMP  Glucose 70 - 99 mg/dL 899  894  891   BUN 6 - 23 mg/dL 20  14  24    Creatinine 0.40 - 1.20 mg/dL 9.07  9.30  9.18   Sodium 135 - 145 mEq/L 140  137  139   Potassium 3.5 - 5.1 mEq/L 4.3  4.0  4.3   Chloride 96 - 112 mEq/L 105  105  108   CO2 19 - 32 mEq/L 25  25  26    Calcium  8.4 - 10.5 mg/dL 9.7  9.1  9.8   Total Protein 6.0 - 8.3 g/dL 7.0   7.0    6.7   Total Bilirubin 0.2 - 1.2 mg/dL 0.9   0.6   Alkaline Phos 39 - 117 U/L 78     AST 0 - 37 U/L 19   16   ALT 0 - 35 U/L 30  24       Latest Ref Rng & Units 08/07/2023   11:48 AM 02/17/2021    1:35 PM 01/26/2019    9:09 AM  CBC  WBC 4.0 - 10.5 K/uL 8.1  6.0  6.9   Hemoglobin 12.0 - 15.0 g/dL 84.6  85.2  84.9   Hematocrit 36.0 - 46.0 % 46.0  43.5  43.9   Platelets 150.0 - 400.0 K/uL 290.0  241  264    No results found for this or any previous visit (from the past 2160 hours).    Review of Systems: Patient complains of symptoms per HPI as well as the following symptoms none. Pertinent negatives and positives per HPI. All others negative.   Social History   Socioeconomic History   Marital status: Married    Spouse name: Alm   Number of children: Not on file   Years of education: Not on file   Highest education level: Not on file  Occupational History   Occupation: CRNA  Tobacco Use   Smoking status: Never    Passive exposure: Current   Smokeless tobacco: Never  Vaping Use   Vaping status: Never Used  Substance and Sexual Activity   Alcohol  use: No   Drug use: No   Sexual activity: Not on file     Comment: ablation  Other Topics Concern   Not on file  Social History Narrative   Married since 1985.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   Social Drivers of Health   Financial Resource Strain: Not on file  Food Insecurity: Low Risk  (12/08/2023)   Received from [facility]   Hunger Vital Sign    Within the past 12 months, you worried that your food would run out before you got money to buy more: Never true    Within the past 12 months, the food you bought just didn't last and you didn't have money to get more. : Never true  Transportation Needs: No Transportation Needs (12/08/2023)   Received from [facility]    In the past 12 months, has lack of reliable transportation kept you from medical appointments, meetings, work or from getting things needed for daily living? : No  Physical Activity: Not on file  Stress: Not on file  Social Connections: Unknown (05/06/2023)   [facility] from [facility]   Social Network    Social Network: Not on file  Intimate Partner Violence: Unknown (05/06/2023)   Received from [facility]   HITS    Physically Hurt: Not on file    Insult or Talk Down To: Not on file    Threaten Physical Harm: Not on file    Scream or Curse: Not on file    Family History  Problem Relation Age of Onset   Deep vein thrombosis Mother    Colon polyps Mother    Heart disease Mother    Depression Mother    Anxiety disorder Mother    Schizophrenia Mother    Obesity Mother    Cancer Father    Hyperlipidemia Father    Gastric cancer Father    Diabetes Father    Heart disease Father    Hypertension Father    Stomach cancer Father    Breast cancer Maternal Aunt    Diabetes Paternal Grandmother    Heart disease Paternal Grandmother    Aortic stenosis Son    Colon cancer Neg Hx    Allergic rhinitis Neg Hx    Asthma Neg Hx  Angioedema Neg Hx    Eczema Neg Hx    Urticaria Neg Hx    Rectal cancer Neg Hx    Esophageal cancer Neg Hx     Inflammatory bowel disease Neg Hx    Liver disease Neg Hx    Pancreatic cancer Neg Hx     Past Medical History:  Diagnosis Date   Acute pancreatitis    Autoimmune thyroiditis    Constipation    Diverticulitis    Gastritis    GERD (gastroesophageal reflux disease)    GI work up in progress   Hashimoto's thyroiditis    Headache    Heartburn    Herpes    Joint pain    Mass on back    Obesity    Pancreatic disease    PONV (postoperative nausea and vomiting)    Rheumatoid arthritis (HCC)    SOBOE (shortness of breath on exertion)    Thyroid  condition    Vitamin D  deficiency     Patient Active Problem List   Diagnosis Date Noted   Dizziness 02/04/2024   Referred otalgia of right ear 02/04/2024   Hashimoto's thyroiditis 07/07/2022   H/o Low serum cortisol level 07/07/2022   Prediabetes 06/30/2022   Hypercholesterolemia 05/15/2022   Other hyperlipidemia 04/15/2022   Vitamin D  deficiency 04/15/2022   Pre-diabetes 04/15/2022   Rheumatoid arthritis (HCC) 04/15/2022   Closed fracture of right clavicle 04/30/2019   Abdominal pain, epigastric 09/30/2016   Acute pancreatitis 09/27/2016   Constipation 09/27/2016   Overweight (BMI 25.0-29.9) 09/27/2016   Mass of right lower leg 08/11/2013   Mass on back 08/11/2013    Past Surgical History:  Procedure Laterality Date   COLONOSCOPY     DILITATION & CURRETTAGE/HYSTROSCOPY WITH HYDROTHERMAL ABLATION N/A 12/26/2016   Procedure: DILATATION & CURETTAGE/HYSTEROSCOPY WITH HYDROTHERMAL ABLATION;  Surgeon: Mat Browning, MD;  Location: [facility];  Service: Gynecology;  Laterality: N/A;   MASS EXCISION Right 02/23/2018   Procedure: EXCISION BACK MASS ERAS PATHWAY;  Surgeon: Ebbie Cough, MD;  Location: [facility];  Service: General;  Laterality: Right;   POLYPECTOMY     TONSILLECTOMY AND ADENOIDECTOMY     TYMPANOSTOMY TUBE PLACEMENT      Current Outpatient Medications  Medication Sig Dispense  Refill   albuterol  (VENTOLIN  HFA) 108 (90 Base) MCG/ACT inhaler Inhale 2 puffs every 6 (six) hours as needed for wheezing. 6.7 g 0   aspirin  EC (ASPIRIN  81) 81 MG tablet Take 1 tablet by mouth once daily 30 tablet 0   celecoxib  (CELEBREX ) 50 MG capsule Take 2 capsules (100 mg total) by mouth 2 (two)  times daily as needed for Pain. 60 capsule 1   Cholecalciferol (VITAMIN D3) 50 MCG (2000 UT) capsule Take 1 capsule (2,000 Units total) by mouth daily.     ciclopirox  (PENLAC ) 8 % solution Apply topically at bedtime. Apply over nail and surrounding skin. Apply daily over previous coat. After seven (7) days, may remove with alcohol  and continue cycle. 6.6 mL 0   hyoscyamine  (LEVSIN  SL) 0.125 MG SL tablet Place 1 tablet (0.125 mg total) under the tongue every 4 (four) hours as needed. 20 tablet 0   Magnesium  Gluconate 550 MG TABS Take 550 mg by mouth. 2 to 3 times a week     Multiple Vitamin (MULTI-VITAMIN) tablet Take 1 tablet by mouth daily.     Omega-3 1000 MG CAPS Take by mouth.     omeprazole  (PRILOSEC) 40 MG capsule Take 1 capsule (  40 mg total) by mouth daily. 30 capsule 6   rosuvastatin  (CRESTOR ) 5 MG tablet Take 1 tablet (5 mg total) by mouth daily. (Please call office to schedule appointment and labs) 30 tablet 0   Semaglutide -Weight Management (WEGOVY ) 0.25 MG/0.5ML SOAJ Inject 0.25 mg into the skin once a week. 2 mL 1   Semaglutide -Weight Management (WEGOVY ) 0.5 MG/0.5ML SOAJ Inject 0.5 mg into the skin once a week. 2 mL 3   Turmeric 400 MG CAPS See admin instructions.     valACYclovir  (VALTREX ) 1000 MG tablet Take 1 tablet (1,000 mg total) by mouth 2 (two) times daily. 180 tablet 3   valACYclovir  (VALTREX ) 1000 MG tablet Take 1 tablet (1,000 mg total) by mouth 2 (two) times daily. 180 tablet 3   zolpidem  (AMBIEN ) 10 MG tablet Take 1 tablet (10 mg total) by mouth at bedtime. 30 tablet 3   amoxicillin -clavulanate (AUGMENTIN ) 875-125 MG tablet Take 1 tablet by mouth every 12 (twelve) hours.  (Patient not taking: Reported on 02/17/2024) 20 tablet 0   Fremanezumab -vfrm (AJOVY ) 225 MG/1.5ML SOAJ Inject 225 mg into the skin every 30 (thirty) days. (Patient not taking: Reported on 02/17/2024) 1.5 mL 2   sucralfate  (CARAFATE ) 1 g tablet Take 1 tablet (1 g total) by mouth 2 (two) times daily. (Patient not taking: Reported on 02/17/2024) 60 tablet 1   sulfaSALAzine  (AZULFIDINE ) 500 MG EC tablet Take 1 tablet (500 mg total) by mouth daily. (Patient not taking: Reported on 02/17/2024) 30 tablet 0   No current facility-administered medications for this visit.    Allergies as of 02/23/2024 - Review Complete 02/23/2024  Allergen Reaction Noted   Doxycycline Nausea And Vomiting 06/21/2019   Flulaval quadrivalent [influenza vac split quad] Other (See Comments) 10/31/2019   Hydroxychloroquine Other (See Comments) 08/16/2020    Vitals: BP 120/78   Pulse 79   Ht 5' 4 (1.626 m)   Wt 186 lb 12.8 oz (84.7 kg)   BMI 32.06 kg/m  Last Weight:  Wt Readings from Last 1 Encounters:  02/23/24 186 lb 12.8 oz (84.7 kg)   Last Height:   Ht Readings from Last 1 Encounters:  02/23/24 5' 4 (1.626 m)    Physical exam: Exam: Gen: NAD, conversant      CV: No palpitations or chest pain or SOB. VS: Breathing at a normal rate. Not febrile. Eyes: Conjunctivae clear without exudates or hemorrhage  Neuro: Detailed Neurologic Exam  Speech:    Speech is normal; fluent and spontaneous with normal comprehension.  Cognition:    The patient is oriented to person, place, and time;     recent and remote memory intact;     language fluent;     normal attention, concentration, fund of knowledge Cranial Nerves:    The pupils are equal, round, and reactive to light. Visual fields are full Extraocular movements are intact.  The face is symmetric with normal sensation. The palate elevates in the midline. Hearing intact. Voice is normal. Shoulder shrug is normal. The tongue has normal motion without  fasciculations.   Coordination: normal  Gait:    No abnormalities noted or reported  Motor Observation:   no involuntary movements noted. Tone:    Appears normal  Posture:    Posture is normal. normal erect    Strength:    Strength is anti-gravity and symmetric in the upper and lower limbs.      Sensation: intact to LT, no reports of numbness or tingling or paresthesias  Assessment/Plan:  Absolutely lovely person here for follow up of Dizziness and imbalance and falls, vertigo, hearing loss, very localized ear pain. Workup for causes to date without etiology, MRI of the brain IAC protocol was normal, DaTscan  was negative for parkinsonian disorders, MRI cervical spine March 2024 done for same reasons did not show any cause such as significant spinal stenosis or myelopathy in the cervical spine that would be symptomatic, Ajovy  did not help and more imbalance than headaches or vertigo so unlikely vestibular migraines.   falls appear mechanical, once was wearing flip flops and tripped on carpet and was walking fast in the rain the other time I don;t think these are concerning and PT evaluation was normal.  At last appointment we recommended she go to ENT, vestibular/physical therapy,  and ordered CT of the temporal bones.  CT of the temporal bones was normal. 2 falls since June appear mechanical, once had flip flops on and the other was hrrying and raining. She is seeing [facility], she continues to have pain in the right ear. She also followed up with Rhaumatology.   I reviewed physical therapy notes,  More imbalance and dizziness at this time.  Physical therapy noted no unsteadiness, complete independence. All sensory inputs including somatosensory, visual and vestibular inputs were all within normal limits.    She also has a vision issue perceived peripheral vision changes/loss, she has seen her eye doctor, she feels she is losing peripheral vision followed up with  ophthalmology and retinal specialist. We can order a CTA of the head and neck, she has hx of carotid stenosis in the right, pulsatile tinnitus, hearing loss, will check for carotid stenosis, vertebral artery etiologies for vertigo/imbalance, also with vision changes and pulsatile tinnitus check for aneurysms.   workup for causes of her imbalance to date without etiology. MRI brain IAC protocol was normal . DAT scan negative for parkinsonian disorders. MRI c-spine 10/25/2022 done for the same reasons did not show any cause such as significant spinal stenosis or myelopathy that would be symptomatic.    Ajovy  did not help and more imbalance than headaches or vertigo so unlikely vestibular migraines. She feels her right ear is affected at the level of the tragus but behind the ear very localized in a specific point. Sae Dr. Karis Dimmer and has appt with [facility]. In the dermatome of c2/c3 but that level was normal on mri cervical spine so unlikely radiculopathy but the other option is occipital possibly. For the ear pain, ruled out clenching with dentist, TMs normal today but she continues to have very localized ear pain. Would be very unlikely occipital neuralgia to be so focused in one spot under the ear, more likely to be a cause that ENT needs to evaluate will send for  evaluation   extensive blood work noram (b12, b6 b2, tsh, vit D, esr, crp) Prescribed wegovy  and recommended weight loss and yoga or other exercises for balance and weight lifting for muscle maintenance  Very unlikely: Paroxysmal ataxia?: very unlikely Heart condition? 30-day cardiac monitor was ordered but not completed by patient still this is unlikelyshe has it sitting on her desk    Orders Placed This Encounter  Procedures   CT ANGIO HEAD W OR WO CONTRAST   CT ANGIO NECK W OR WO CONTRAST   Meds ordered this encounter  Medications   Semaglutide -Weight Management (WEGOVY ) 0.5 MG/0.5ML SOAJ    Sig: Inject 0.5 mg into the skin  once a week.  Dispense:  2 mL    Refill:  3    Cc: Rolinda Millman, MD,  Murleen Fine, MD  Onetha Epp, MD  [facility] [address] [place], [place] [address]  Phone [phone] Fax [phone]  I spent 43 minutes of face-to-face and non-face-to-face time with patient on the  1. Decreased peripheral vision of both eyes   2. Tinnitus of right ear   3. Subjective hearing change of right ear   4. Pulsatile tinnitus of right ear     diagnosis.  This included previsit chart review, lab review, study review, order entry, electronic health record documentation, patient education on the different diagnostic and therapeutic options, counseling and coordination of care, risks and benefits of management, compliance, or risk factor reduction

## 2024-02-24 ENCOUNTER — Other Ambulatory Visit (HOSPITAL_COMMUNITY): Payer: Self-pay

## 2024-02-24 ENCOUNTER — Telehealth: Payer: Self-pay | Admitting: Neurology

## 2024-02-24 NOTE — Telephone Encounter (Signed)
 sent to GI they obtain [place] [facility] [phone]

## 2024-02-25 ENCOUNTER — Other Ambulatory Visit (HOSPITAL_COMMUNITY): Payer: Self-pay

## 2024-02-26 ENCOUNTER — Other Ambulatory Visit (HOSPITAL_COMMUNITY): Payer: Self-pay

## 2024-02-26 ENCOUNTER — Other Ambulatory Visit: Payer: Self-pay

## 2024-02-26 ENCOUNTER — Encounter (HOSPITAL_COMMUNITY): Payer: Self-pay

## 2024-03-02 ENCOUNTER — Encounter: Payer: Self-pay | Admitting: Neurology

## 2024-03-07 ENCOUNTER — Other Ambulatory Visit (HOSPITAL_COMMUNITY): Payer: Self-pay

## 2024-03-08 ENCOUNTER — Other Ambulatory Visit (HOSPITAL_COMMUNITY): Payer: Self-pay

## 2024-03-08 ENCOUNTER — Other Ambulatory Visit: Payer: Self-pay

## 2024-03-08 MED ORDER — ROSUVASTATIN CALCIUM 5 MG PO TABS
5.0000 mg | ORAL_TABLET | Freq: Every day | ORAL | 0 refills | Status: DC
  Filled 2024-03-08: qty 15, 15d supply, fill #0

## 2024-03-21 ENCOUNTER — Other Ambulatory Visit (HOSPITAL_COMMUNITY): Payer: Self-pay

## 2024-03-21 ENCOUNTER — Encounter: Payer: Self-pay | Admitting: Neurology

## 2024-03-21 MED ORDER — AMOXICILLIN-POT CLAVULANATE 875-125 MG PO TABS
1.0000 | ORAL_TABLET | Freq: Two times a day (BID) | ORAL | 0 refills | Status: AC
  Filled 2024-03-21: qty 20, 10d supply, fill #0

## 2024-03-22 ENCOUNTER — Inpatient Hospital Stay: Admission: RE | Admit: 2024-03-22 | Source: Ambulatory Visit

## 2024-03-22 ENCOUNTER — Other Ambulatory Visit: Payer: Self-pay

## 2024-03-22 ENCOUNTER — Other Ambulatory Visit (HOSPITAL_COMMUNITY): Payer: Self-pay

## 2024-03-22 MED ORDER — ROSUVASTATIN CALCIUM 5 MG PO TABS
5.0000 mg | ORAL_TABLET | Freq: Every day | ORAL | 0 refills | Status: DC
  Filled 2024-03-22: qty 15, 15d supply, fill #0

## 2024-03-23 ENCOUNTER — Other Ambulatory Visit: Payer: Self-pay

## 2024-03-23 ENCOUNTER — Other Ambulatory Visit (HOSPITAL_COMMUNITY): Payer: Self-pay

## 2024-03-23 MED ORDER — CELECOXIB 100 MG PO CAPS
100.0000 mg | ORAL_CAPSULE | Freq: Two times a day (BID) | ORAL | 1 refills | Status: AC
  Filled 2024-03-23: qty 60, 30d supply, fill #0
  Filled 2024-04-15 – 2024-04-18 (×2): qty 60, 30d supply, fill #1

## 2024-03-24 ENCOUNTER — Other Ambulatory Visit (HOSPITAL_COMMUNITY): Payer: Self-pay

## 2024-03-29 ENCOUNTER — Telehealth: Payer: Self-pay | Admitting: *Deleted

## 2024-03-29 ENCOUNTER — Other Ambulatory Visit (HOSPITAL_COMMUNITY): Payer: Self-pay

## 2024-03-29 MED ORDER — FOLIC ACID 1 MG PO TABS
1.0000 mg | ORAL_TABLET | Freq: Every day | ORAL | 3 refills | Status: DC
  Filled 2024-03-29: qty 30, 30d supply, fill #0
  Filled 2024-04-22 – 2024-04-25 (×2): qty 30, 30d supply, fill #1
  Filled 2024-05-22: qty 30, 30d supply, fill #2

## 2024-03-29 MED ORDER — METHOTREXATE SODIUM 2.5 MG PO TABS
10.0000 mg | ORAL_TABLET | ORAL | 0 refills | Status: DC
  Filled 2024-03-29: qty 16, 28d supply, fill #0

## 2024-03-29 NOTE — Telephone Encounter (Signed)
 CT Angiogram is unremarkable, no significant or flow-limiting stenoses or occlusions. I would make sure she manage all her vascular risk factors but no cause seen in the arteries for her symptoms. Thank you, Dr. Ines

## 2024-03-29 NOTE — Telephone Encounter (Signed)
 Results - CT Angio Head Neck (03/22/2024 11:10 AM EDT) Procedure Note  Robin Dallas HERO II, DO - 03/29/2024  Formatting of this note might be different from the original. CTA carotids/intracranial:  INDICATION: Decreased peripheral vision of both eyes   TECHNIQUE: Thin axial scans were performed from the top of the aortic arch up through the neck and head after bolus injection of 80 mL of Isovue -370. Image postprocessing was then performed creating multiplanar 2-D and angiographic 3-D MIP, shaded surface rendering, or 3-D volume rendered images for review and comparison.Dose reduction was utilized (automated exposure control, mA or kV adjustment based on patient size, or iterative image  COMPARISON: Brain MRI March 08, 2023  FINDINGS: # Aortic arch and brachiocephalic vessels: No significant abnormality # Right vertebral: Normal # Left vertebral: Normal # Right carotid artery: Atherosclerotic calcification and soft plaque in the proximal ICA without significant stenosis. # Left carotid artery: Normal.  Intracranial vessels: # Distal internal carotid arteries: Mild calcification bilaterally. No occlusion or significant stenosis # Anterior cerebral arteries: Normal. # Middle cerebral arteries: Normal. # Distal vertebral arteries: Normal. # Basilar artery: Normal # Posterior cerebral arteries: Normal.  # Dural sinuses: Patient  # # Lungs/pulmonary arteries:Lung apices are clear. Visualized pulmonary arteries are patent   IMPRESSION: 1. No acute intracranial central large vessel occlusion or significant stenosis.  2. Calcification and plaque in the proximal right ICA without significant stenosis. No occlusion. No extracranial carotid occlusion or significant stenosis on the left.  3. Patent bilateral vertebral arteries       Degree of stenosis is determined using NASCET measurement technique: % stenosis = (1 - [ narrowest diameter/ normal distal diameter]) Severe:  70-99% Moderate: 50-69%. Mild: Less than 50%.   Electronically Signed by: Dallas Wilfred, MD on 03/29/2024 7:42 AM

## 2024-03-31 ENCOUNTER — Other Ambulatory Visit (HOSPITAL_COMMUNITY): Payer: Self-pay

## 2024-04-04 ENCOUNTER — Other Ambulatory Visit (HOSPITAL_COMMUNITY): Payer: Self-pay

## 2024-04-05 ENCOUNTER — Other Ambulatory Visit (HOSPITAL_COMMUNITY): Payer: Self-pay

## 2024-04-05 ENCOUNTER — Other Ambulatory Visit: Payer: Self-pay

## 2024-04-05 MED ORDER — ROSUVASTATIN CALCIUM 5 MG PO TABS
5.0000 mg | ORAL_TABLET | Freq: Every day | ORAL | 0 refills | Status: DC
  Filled 2024-04-05: qty 7, 7d supply, fill #0

## 2024-04-08 ENCOUNTER — Other Ambulatory Visit (HOSPITAL_COMMUNITY): Payer: Self-pay

## 2024-04-08 MED ORDER — ROSUVASTATIN CALCIUM 5 MG PO TABS
5.0000 mg | ORAL_TABLET | Freq: Every day | ORAL | 0 refills | Status: AC
  Filled 2024-04-09 – 2024-04-11 (×2): qty 7, 7d supply, fill #0

## 2024-04-09 ENCOUNTER — Other Ambulatory Visit (HOSPITAL_COMMUNITY): Payer: Self-pay

## 2024-04-11 ENCOUNTER — Other Ambulatory Visit: Payer: Self-pay

## 2024-04-11 ENCOUNTER — Other Ambulatory Visit (HOSPITAL_COMMUNITY): Payer: Self-pay

## 2024-04-11 ENCOUNTER — Encounter (HOSPITAL_COMMUNITY): Payer: Self-pay

## 2024-04-13 ENCOUNTER — Other Ambulatory Visit (HOSPITAL_COMMUNITY): Payer: Self-pay

## 2024-04-14 ENCOUNTER — Other Ambulatory Visit (HOSPITAL_COMMUNITY): Payer: Self-pay

## 2024-04-14 MED ORDER — ROSUVASTATIN CALCIUM 5 MG PO TABS
5.0000 mg | ORAL_TABLET | Freq: Every day | ORAL | 3 refills | Status: AC
  Filled 2024-04-14: qty 90, 90d supply, fill #0
  Filled 2024-04-18: qty 30, 30d supply, fill #0
  Filled 2024-05-13: qty 30, 30d supply, fill #1
  Filled 2024-06-11: qty 30, 30d supply, fill #2
  Filled 2024-07-11: qty 30, 30d supply, fill #3
  Filled 2024-08-07: qty 30, 30d supply, fill #4
  Filled 2024-09-07: qty 30, 30d supply, fill #5

## 2024-04-15 ENCOUNTER — Other Ambulatory Visit (HOSPITAL_COMMUNITY): Payer: Self-pay

## 2024-04-16 ENCOUNTER — Other Ambulatory Visit (HOSPITAL_COMMUNITY): Payer: Self-pay

## 2024-04-18 ENCOUNTER — Other Ambulatory Visit (HOSPITAL_COMMUNITY): Payer: Self-pay

## 2024-04-21 ENCOUNTER — Other Ambulatory Visit (HOSPITAL_COMMUNITY): Payer: Self-pay

## 2024-04-22 ENCOUNTER — Other Ambulatory Visit (HOSPITAL_COMMUNITY): Payer: Self-pay

## 2024-05-24 ENCOUNTER — Other Ambulatory Visit (HOSPITAL_COMMUNITY): Payer: Self-pay

## 2024-05-24 ENCOUNTER — Other Ambulatory Visit: Payer: Self-pay

## 2024-05-24 MED ORDER — VALACYCLOVIR HCL 1 G PO TABS
1.0000 g | ORAL_TABLET | Freq: Two times a day (BID) | ORAL | 3 refills | Status: AC
  Filled 2024-05-25 – 2024-06-12 (×18): qty 180, 90d supply, fill #0
  Filled 2024-06-18: qty 60, 30d supply, fill #0
  Filled 2024-07-13: qty 60, 30d supply, fill #1
  Filled 2024-08-12: qty 60, 30d supply, fill #2

## 2024-05-24 MED ORDER — CICLOPIROX 8 % EX SOLN
CUTANEOUS | 3 refills | Status: AC
  Filled 2024-05-24: qty 6.6, 30d supply, fill #0
  Filled 2024-06-17 – 2024-06-18 (×2): qty 6.6, 30d supply, fill #1
  Filled 2024-07-17: qty 6.6, 30d supply, fill #2
  Filled 2024-08-16: qty 6.6, 30d supply, fill #3

## 2024-05-25 ENCOUNTER — Other Ambulatory Visit (HOSPITAL_COMMUNITY): Payer: Self-pay

## 2024-05-25 ENCOUNTER — Encounter: Payer: Self-pay | Admitting: Gastroenterology

## 2024-05-26 ENCOUNTER — Other Ambulatory Visit (HOSPITAL_COMMUNITY): Payer: Self-pay

## 2024-05-26 ENCOUNTER — Other Ambulatory Visit: Payer: Self-pay

## 2024-05-26 DIAGNOSIS — R1012 Left upper quadrant pain: Secondary | ICD-10-CM

## 2024-05-27 ENCOUNTER — Telehealth: Payer: Self-pay | Admitting: Gastroenterology

## 2024-05-27 ENCOUNTER — Other Ambulatory Visit (HOSPITAL_COMMUNITY): Payer: Self-pay

## 2024-05-27 ENCOUNTER — Other Ambulatory Visit: Payer: Self-pay

## 2024-05-27 MED ORDER — NITROFURANTOIN MONOHYD MACRO 100 MG PO CAPS
100.0000 mg | ORAL_CAPSULE | Freq: Two times a day (BID) | ORAL | 0 refills | Status: AC
  Filled 2024-05-27 (×2): qty 10, 5d supply, fill #0

## 2024-05-27 NOTE — Telephone Encounter (Signed)
 Is there a number to call? I have faxed the order to them x2 not sure what they need.

## 2024-05-27 NOTE — Telephone Encounter (Signed)
 Inbound call from [facility] imaging requesting to know if they are able to have a form sent over so they do not have to wait until patient to call and schedule. Please advise, thank you.

## 2024-05-28 ENCOUNTER — Other Ambulatory Visit (HOSPITAL_COMMUNITY): Payer: Self-pay

## 2024-05-29 ENCOUNTER — Other Ambulatory Visit (HOSPITAL_COMMUNITY): Payer: Self-pay

## 2024-05-30 ENCOUNTER — Other Ambulatory Visit: Payer: Self-pay

## 2024-05-30 ENCOUNTER — Other Ambulatory Visit (HOSPITAL_BASED_OUTPATIENT_CLINIC_OR_DEPARTMENT_OTHER): Payer: Self-pay

## 2024-05-30 ENCOUNTER — Other Ambulatory Visit (HOSPITAL_COMMUNITY): Payer: Self-pay

## 2024-05-31 ENCOUNTER — Other Ambulatory Visit (HOSPITAL_COMMUNITY): Payer: Self-pay

## 2024-06-01 ENCOUNTER — Other Ambulatory Visit (HOSPITAL_BASED_OUTPATIENT_CLINIC_OR_DEPARTMENT_OTHER): Payer: Self-pay

## 2024-06-01 ENCOUNTER — Other Ambulatory Visit: Payer: Self-pay

## 2024-06-02 ENCOUNTER — Other Ambulatory Visit (HOSPITAL_COMMUNITY): Payer: Self-pay

## 2024-06-03 ENCOUNTER — Other Ambulatory Visit (HOSPITAL_COMMUNITY): Payer: Self-pay

## 2024-06-03 NOTE — Telephone Encounter (Signed)
 Noted pt advised via My Chart

## 2024-06-03 NOTE — Telephone Encounter (Signed)
 CT scan from [facility] has been reviewed:  FINDINGS:   VISUALIZED LOWER THORAX:  No acute abnormalities.   SOLID VISCERA:  Liver: Normal.  Gallbladder: No radiodense gallstones.  Pancreas: Normal.  Adrenal glands: Normal.  Spleen: Normal.  Kidneys: Normal.   GI:  No bowel obstruction.  No focal bowel wall thickening or inflammatory changes.  No evidence of acute appendicitis.   PERITONEAL CAVITY/RETROPERITONEUM:  No free fluid.  No pneumoperitoneum.  Lymph node in the left femoral region measures 1.5 x 0.7 cm, unchanged compared to 11/27/2023.  Aorta, IVC, iliac arteries, and major visceral arteries are grossly normal.   PELVIS:  No acute abnormalities. 1.3 cm left adnexal cyst is unchanged.   MUSCULOSKELETAL:  No acute or destructive osseous processes.   MISC:  N/A.    IMPRESSION:  1. No acute abnormality in the abdomen or pelvis.     Please let patient know that no new issues have developed. No evidence of diverticulitis. This is good news.   Aloha Finner, MD [facility] Office # [phone]

## 2024-06-04 ENCOUNTER — Other Ambulatory Visit (HOSPITAL_COMMUNITY): Payer: Self-pay

## 2024-06-06 ENCOUNTER — Other Ambulatory Visit (HOSPITAL_COMMUNITY): Payer: Self-pay

## 2024-06-06 NOTE — Telephone Encounter (Signed)
 Dr Wilhelmenia is this something that GI would order or PCP?   I discussed my CT results and symptoms of left lower groin pain with primary MD, that persists despite ABX treatment for bladder infection,  she cannot see CT results but I did forward screen shots of results.  She would like for you to order General surgery consult for possible ultrasound guided biopsy of femoral lymph nodes. I have been seen by Ebbie , [place] [facility] in the past..thank you for helping.

## 2024-06-07 ENCOUNTER — Other Ambulatory Visit: Payer: Self-pay

## 2024-06-07 ENCOUNTER — Other Ambulatory Visit (HOSPITAL_COMMUNITY): Payer: Self-pay

## 2024-06-08 ENCOUNTER — Other Ambulatory Visit (HOSPITAL_COMMUNITY): Payer: Self-pay

## 2024-06-09 ENCOUNTER — Other Ambulatory Visit: Payer: Self-pay

## 2024-06-10 ENCOUNTER — Telehealth: Payer: Self-pay

## 2024-06-10 ENCOUNTER — Other Ambulatory Visit: Payer: Self-pay

## 2024-06-10 ENCOUNTER — Other Ambulatory Visit (HOSPITAL_BASED_OUTPATIENT_CLINIC_OR_DEPARTMENT_OTHER): Payer: Self-pay

## 2024-06-10 DIAGNOSIS — I899 Noninfective disorder of lymphatic vessels and lymph nodes, unspecified: Secondary | ICD-10-CM

## 2024-06-10 NOTE — Telephone Encounter (Signed)
-----   Message from [facility] sent at 06/10/2024  6:12 AM EST ----- Regarding: RE: Referral Eldene Plocher, Go ahead and place referral interventional radiology attempt at ultrasound-guided lymph node biopsy. Thank you. GM ----- Message ----- From: Anitra Odetta CROME, RN Sent: 06/09/2024   4:00 PM EST To: Aloha Wilhelmenia Raddle., MD Subject: EMELDA: Referral                                    ----- Message ----- From: Maryl Almarie LABOR Sent: 06/09/2024   3:50 PM EST To: Odetta CROME Anitra, RN Subject: RE: Referral                                   Hi, Hi have spoken with Dr. Ebbie and he stated whom ever referred her for a CT scan can refer her to IR and they can do the guided biopsy, if surgery is need after any finds then he is requesting anew referral be sent. ----- Message ----- From: Anitra Odetta CROME, RN Sent: 06/09/2024   1:55 PM EST To: Almarie LABOR Maryl Subject: FW: Referral                                   Almarie see note from Dr Wilhelmenia ----- Message ----- From: Wilhelmenia Aloha Raddle., MD Sent: 06/09/2024   1:55 PM EST To: Odetta CROME Anitra, RN Subject: RE: Referral                                   Tavita Eastham, The referral was to go back to Dr. Ebbie to discuss if lymph node biopsy would be required or monitoring. Sorry. Thanks. GM ----- Message ----- From: Anitra Odetta CROME, RN Sent: 06/06/2024   4:11 PM EST To: Aloha Wilhelmenia Raddle., MD Subject: RE: Referral                                   Dr Wilhelmenia see message and advise ----- Message ----- From: Maryl Almarie LABOR Sent: 06/06/2024   3:43 PM EST To: Odetta CROME Anitra, RN Subject: Referral                                       Hello, I received the referral for Jon EMERSON Roe today, however our practice does not do ultra sound guided biopsy of femoral lymph nodes. The referral has been denied.   Thank you, Beth K.

## 2024-06-11 ENCOUNTER — Other Ambulatory Visit (HOSPITAL_COMMUNITY): Payer: Self-pay

## 2024-06-12 ENCOUNTER — Other Ambulatory Visit (HOSPITAL_COMMUNITY): Payer: Self-pay

## 2024-06-14 ENCOUNTER — Other Ambulatory Visit: Payer: Self-pay

## 2024-06-16 ENCOUNTER — Other Ambulatory Visit: Payer: Self-pay

## 2024-06-17 ENCOUNTER — Other Ambulatory Visit (HOSPITAL_COMMUNITY): Payer: Self-pay

## 2024-06-18 ENCOUNTER — Other Ambulatory Visit (HOSPITAL_COMMUNITY): Payer: Self-pay

## 2024-07-05 ENCOUNTER — Encounter: Payer: Self-pay | Admitting: Internal Medicine

## 2024-07-05 ENCOUNTER — Ambulatory Visit: Payer: Managed Care, Other (non HMO) | Admitting: Internal Medicine

## 2024-07-05 ENCOUNTER — Other Ambulatory Visit

## 2024-07-05 VITALS — BP 138/84 | HR 76 | Ht 64.0 in | Wt 172.0 lb

## 2024-07-05 DIAGNOSIS — E559 Vitamin D deficiency, unspecified: Secondary | ICD-10-CM

## 2024-07-05 DIAGNOSIS — R7989 Other specified abnormal findings of blood chemistry: Secondary | ICD-10-CM

## 2024-07-05 DIAGNOSIS — E063 Autoimmune thyroiditis: Secondary | ICD-10-CM | POA: Diagnosis not present

## 2024-07-05 NOTE — Patient Instructions (Signed)
 Please stop at the lab.  Please return for a follow-up appointment in 1 year.

## 2024-07-05 NOTE — Progress Notes (Signed)
 Recommend pAatient ID: Robin Maynard, female   DOB: 1966-04-02, 58 y.o.   MRN: [id number]   HPI  Robin Maynard is a 58 y.o.-year-old female, initially referred by her PCP, Dr.Le, returning for follow-up for for low cortisol level -ruled out for adrenal insufficiency, also, euthyroid Hashimoto's thyroiditis.  Last visit 1 year ago.  Interim history: She is feeling well, without complaints today. She also sees neurology (Dr. Ines) -she has a history of dizziness, imbalance, falls, B6 and B12 deficiency and parkinsonism.  The investigation was negative. She also had decreased peripheral vision in both eyes but without pathology on MRI. She started Wegovy  at 0.25 mg weekly in 08-04/2024 >> now at 1 mg weekly, increased 2 weeks ago. She feels this was a life changing intervention. Her brain fog, dizziness joint pain and weight improved.  History of low cortisol level: She had URI 09/2016 >> L upper abd. Pain >> ED >> CT scan - hazing around sSMA >> referred to Dr. Teressa >> further investig. Was negative >> sent back to PCP, who checked a cortisol level >> low cortisol level in 03/2017.  No h/o steroid use in the past (Dexamethasone  10 mg in 12/2016). No h/o Depo-provera, Megace, po ketoconazole, phenytoin, rifampin, chronic fluconazole use. No h/o autoimmune diseases in pt or family mbs. No excess use of NSAIDs. No h/o generalized infections or HIV. No IVDA. No h/o head injury or severe HA. No h/o malignancy.  Reviewed previous investigation:         Office Visit on 08/10/2017  Component Date Value Ref Range Status   C206 ACTH  08/10/2017 20  6 - 50 pg/mL Final    Comment: Reference range applies only to specimens collected between 7am-10am     Cortisol, Plasma 08/10/2017 7.7  ug/dL Final    AM:  4.3 - 77.5 ug/dLPM:  3.1 - 16.7 ug/dL   Cortisol, Plasma 98/92/7980 19.2  ug/dL Final    AM:  4.3 - 77.5 ug/dLPM:  3.1 - 16.7 ug/dL   Cortisol, Plasma 98/92/7980 22.7  ug/dL Final     AM:  4.3 - 77.5 ug/dLPM:  3.1 - 16.7 ug/dL   91/83/7981: cortisol 4.9 (am)  No history of hyponatremia or hyperkalemia:   CT abdomen (06/01/2024):  Adrenal glands: Normal.   Pt. also has a history of DUB, s/p ablation 12/2016, GERD, elevated FSH of 53 in 03/2017.  Reviewed history: Patient describes that in ~2017 she started to be very tired and this exacerbated over time.  However, since 04/2018 she feels that her fatigue improved slightly.  Her PCP investigated her for possible autoimmune diseases and also chronic viral infections and the investigation was negative except for elevated thyroglobulin antibodies.  She was referred back to endocrinology.  Hashimoto's thyroiditis:  Reviewed previous investigation: Component     Latest Ref Rng 07/06/2023  Thyroglobulin Ab     < or = 1 IU/mL 3 (H)   Thyroperoxidase Ab SerPl-aCnc     <9 IU/mL 2    Component     Latest Ref Rng & Units 07/04/2021  Thyroglobulin Ab     < or = 1 IU/mL 4 (H)  Thyroperoxidase Ab SerPl-aCnc     <9 IU/mL 2   Component     Latest Ref Rng & Units 10/26/2018 06/29/2019 07/03/2020  Thyroglobulin Ab     < or = 1 IU/mL 6 (H) 5 (H) 8 (H)  Thyroperoxidase Ab SerPl-aCnc     <9 IU/mL   2  06/08/2018:  Thyroglobulin Ab 6.7, TPO Ab 10  Component     Latest Ref Rng & Units 06/29/2019  Thyroglobulin Ab     < or = 1 IU/mL 5 (H)  VITD     30.00 - 100.00 ng/mL 33.54  Vitamin B12     211 - 911 pg/mL 812   Previous TSH levels: Lab Results  Component Value Date   TSH 2.19 07/06/2023   TSH 1.66 07/24/2022   TSH 1.180 04/01/2022   TSH 1.76 07/04/2021   TSH 3.71 02/19/2021   TSH 1.58 07/03/2020   TSH 1.69 06/29/2019   TSH 1.41 10/26/2018   TSH 1.48 06/08/2018   I recommended to start selenium to help lower her antithyroid antibodies.  She started a multivitamin + Selenium 150 mcg daily and she felt much better afterwards. This contains Biotin 1200 mcg in MVI  - last dose yesterday.  She previously saw  rheumatology (Dr. Loreli) to rule out RA.  She was found to have hypogammaglobulinemia. She also saw an immunologist >> she was reassured about this condition.  However, she was then seen by rheumatology for a second opinion at [facility] >> possible RA. Started HO Chloroquine >> nausea, GERD, metallic taste in mouth >> stopped.   She is on Turmeric, ginger root, omega 3 FAs.   Reviewed previous vitamin D  levels: Lab Results  Component Value Date   VD25OH 31 07/06/2023   VD25OH 28.55 (L) 07/04/2021   VD25OH 33.54 06/29/2019  04/01/2022: Vitamin D  44 She continues on vitamin D  2000 IU daily.  In 2023, her rheumatologist recommended acromegaly check 2/2 enlarged hands and feet.  She mentioned that over the years, her shoe size increased from 8-9.  Also, she felt that her hands are larger.  Her wedding ring was tight.  She denied increased headaches, increased dental spacing, increased sweating.  She had joint aches-RA, but no increased sweating, headaches, prognatism, wide interdental spaces.  A fasting growth hormone and IGF-I levels were normal: Component     Latest Ref Rng 07/24/2022  IGF-I, LC/MS     50 - 317 ng/mL 97   Z-Score (Female)     -2.0 - 2.0 SD -0.7   Growth Hormone     < OR = 7.1 ng/mL 0.1   No signs of acromegaly.  She was started on ASA and Crestor  5 mg daily for carotid plaque incidentally found during imaging of her neck. She has a history of prediabetes, with the latest HbA1c 6.1% (11/25/2023), decreased from 6.2% (04/01/2022).  ROS: + see HPI  I reviewed pt's medications, allergies, PMH, social hx, family hx, and changes were documented in the history of present illness. Otherwise, unchanged from my initial visit note.  Past Medical History:  Diagnosis Date   Acute pancreatitis    Autoimmune thyroiditis    Constipation    Diverticulitis    Gastritis    GERD (gastroesophageal reflux disease)    GI work up in progress   Hashimoto's thyroiditis    Headache     Heartburn    Herpes    Joint pain    Mass on back    Obesity    Pancreatic disease    PONV (postoperative nausea and vomiting)    Rheumatoid arthritis (HCC)    SOBOE (shortness of breath on exertion)    Thyroid  condition    Vitamin D  deficiency    Past Surgical History:  Procedure Laterality Date   COLONOSCOPY     DILITATION & CURRETTAGE/HYSTROSCOPY WITH HYDROTHERMAL  ABLATION N/A 12/26/2016   Procedure: DILATATION & CURETTAGE/HYSTEROSCOPY WITH HYDROTHERMAL ABLATION;  Surgeon: Mat Browning, MD;  Location: [facility] [place];  Service: Gynecology;  Laterality: N/A;   MASS EXCISION Right 02/23/2018   Procedure: EXCISION BACK MASS ERAS PATHWAY;  Surgeon: Ebbie Cough, MD;  Location: [facility];  Service: General;  Laterality: Right;   POLYPECTOMY     TONSILLECTOMY AND ADENOIDECTOMY     TYMPANOSTOMY TUBE PLACEMENT     Social History   Socioeconomic History   Marital status: Married    Spouse name: Not on file   Number of children: 1  Social Needs  Occupational History   [employer]  Tobacco Use   Smoking status: Never Smoker   Smokeless tobacco: Never Used  Substance and Sexual Activity   Alcohol  use: No   Drug use: No   Current Outpatient Medications on File Prior to Visit  Medication Sig Dispense Refill   albuterol  (VENTOLIN  HFA) 108 (90 Base) MCG/ACT inhaler Inhale 2 puffs every 6 (six) hours as needed for wheezing. 6.7 g 0   amoxicillin -clavulanate (AUGMENTIN ) 875-125 MG tablet Take 1 tablet by mouth 2 (two) times daily. 20 tablet 0   aspirin  EC (ASPIRIN  81) 81 MG tablet Take 1 tablet by mouth once daily 30 tablet 0   celecoxib  (CELEBREX ) 100 MG capsule Take 1 capsule (100 mg total) by mouth 2 (two) times daily. 60 capsule 1   celecoxib  (CELEBREX ) 50 MG capsule Take 2 capsules (100 mg total) by mouth 2 (two)  times daily as needed for Pain. 60 capsule 1   Cholecalciferol (VITAMIN D3) 50 MCG (2000 UT) capsule Take 1 capsule (2,000  Units total) by mouth daily.     ciclopirox  (PENLAC ) 8 % solution Apply topically at bedtime. Apply over nail and surrounding skin. Apply daily over previous coat. After seven (7) days, may remove with alcohol  and continue cycle. 6.6 mL 3   Fremanezumab -vfrm (AJOVY ) 225 MG/1.5ML SOAJ Inject 225 mg into the skin every 30 (thirty) days. (Patient not taking: Reported on 02/17/2024) 1.5 mL 2   hyoscyamine  (LEVSIN  SL) 0.125 MG SL tablet Place 1 tablet (0.125 mg total) under the tongue every 4 (four) hours as needed. 20 tablet 0   Magnesium  Gluconate 550 MG TABS Take 550 mg by mouth. 2 to 3 times a week     Multiple Vitamin (MULTI-VITAMIN) tablet Take 1 tablet by mouth daily.     Omega-3 1000 MG CAPS Take by mouth.     omeprazole  (PRILOSEC) 40 MG capsule Take 1 capsule (40 mg total) by mouth daily. 30 capsule 6   rosuvastatin  (CRESTOR ) 5 MG tablet Take 1 tablet (5 mg total) by mouth daily. MD requests office visit for refills 7 tablet 0   rosuvastatin  (CRESTOR ) 5 MG tablet Take 1 tablet (5 mg total) by mouth daily. 90 tablet 3   Semaglutide -Weight Management (WEGOVY ) 0.25 MG/0.5ML SOAJ Inject 0.25 mg into the skin once a week. 2 mL 1   Semaglutide -Weight Management (WEGOVY ) 0.5 MG/0.5ML SOAJ Inject 0.5 mg into the skin once a week. 2 mL 3   sucralfate  (CARAFATE ) 1 g tablet Take 1 tablet (1 g total) by mouth 2 (two) times daily. (Patient not taking: Reported on 02/17/2024) 60 tablet 1   sulfaSALAzine  (AZULFIDINE ) 500 MG EC tablet Take 1 tablet (500 mg total) by mouth daily. (Patient not taking: Reported on 02/17/2024) 30 tablet 0   Turmeric 400 MG CAPS See admin instructions.  valACYclovir  (VALTREX ) 1000 MG tablet Take 1 tablet (1,000 mg total) by mouth 2 (two) times daily. 180 tablet 3   valACYclovir  (VALTREX ) 1000 MG tablet Take 1 tablet (1,000 mg total) by mouth 2 (two) times daily. 180 tablet 3   valACYclovir  (VALTREX ) 1000 MG tablet Take 1 tablet (1,000 mg total) by mouth 2 (two) times daily. 180  tablet 3   zolpidem  (AMBIEN ) 10 MG tablet Take 1 tablet (10 mg total) by mouth at bedtime. 30 tablet 3   No current facility-administered medications on file prior to visit.   Allergies  Allergen Reactions   Doxycycline Nausea And Vomiting   Flulaval Quadrivalent [Influenza Vac Split Quad] Other (See Comments)    Weakness and don't feel good from the vaccine   Hydroxychloroquine Other (See Comments)    Nausea/malaise/reflux    Family History  Problem Relation Age of Onset   Deep vein thrombosis Mother    Colon polyps Mother    Heart disease Mother    Depression Mother    Anxiety disorder Mother    Schizophrenia Mother    Obesity Mother    Cancer Father    Hyperlipidemia Father    Gastric cancer Father    Diabetes Father    Heart disease Father    Hypertension Father    Stomach cancer Father    Breast cancer Maternal Aunt    Diabetes Paternal Grandmother    Heart disease Paternal Grandmother    Aortic stenosis Son    Colon cancer Neg Hx    Allergic rhinitis Neg Hx    Asthma Neg Hx    Angioedema Neg Hx    Eczema Neg Hx    Urticaria Neg Hx    Rectal cancer Neg Hx    Esophageal cancer Neg Hx    Inflammatory bowel disease Neg Hx    Liver disease Neg Hx    Pancreatic cancer Neg Hx    PE: BP 138/84   Pulse 76   Ht 5' 4 (1.626 m)   Wt 172 lb (78 kg)   SpO2 97%   PF 76 L/min   BMI 29.52 kg/m  Wt Readings from Last 10 Encounters:  07/05/24 172 lb (78 kg)  02/23/24 186 lb 12.8 oz (84.7 kg)  11/11/23 184 lb 4 oz (83.6 kg)  10/20/23 187 lb (84.8 kg)  08/18/23 183 lb (83 kg)  08/07/23 183 lb 4 oz (83.1 kg)  07/06/23 184 lb 3.2 oz (83.6 kg)  06/24/23 185 lb (83.9 kg)  10/22/22 176 lb (79.8 kg)  07/07/22 173 lb 9.6 oz (78.7 kg)   Constitutional: Slightly overweight, in NAD Eyes:  EOMI, no exophthalmos ENT: no neck masses, no cervical lymphadenopathy Cardiovascular: RRR, No MRG Respiratory: CTA B Musculoskeletal: no deformities Skin:no rashes Neurological: no  tremor with outstretched hands  ASSESSMENT: 1. H/o Low cortisol level  2.  Euthyroid Hashimoto's thyroiditis  3.  Vitamin D  deficiency  PLAN:  H/o Low cortisol level -Patient with history of fatigue, abdominal pain, and a slightly low a.m. cortisol in 2018.  She remembers feeling great after 10 mg of dexamethasone  which was given at the time of her endometrial ablation in 12/2016.  We discussed that this was not unusual, even in patients without adrenal insufficiency -Her adrenals appeared normal on review of the CT scan reports from 12/2016, 05/2021, 05/2024 - A cosyntropin  stimulation test was normal in 2019 - She denies anorexia, nausea, abdominal pain, or other signs of adrenal insufficiency. - In fact, she mentions  that her dizziness, mental fog, and weight gain all significantly improved after starting Wegovy .  She was able to lose 15 pounds since our last visit.  She feels great!  She continues to see rheumatology.  She also sees neurology.  Hashimoto's encephalitis was entertained and she may see a neurologist specializing in autoimmune diseases. - no further investigation needed for adrenal insufficiency for now but will continue to follow her clinically  2. Euthyroid Hashimoto's thyroiditis - Diagnosed based on elevated antithyroid antibodies -at last visit, TPO's were normal and ATA's were only slightly elevated, but improved. -She is on selenium (150 mcg daily along with a multivitamin) which may help reduce antibody levels -she felt much better after adding this.   - Latest TSH level was normal a year ago: Lab Results  Component Value Date   TSH 2.19 07/06/2023  - We will recheck her TFTs now along with her thyroid  antibody levels  3.  Vitamin D  deficiency - She had joint pains but these improved after changing her diet in the past - She continues on 2000 units vitamin D  daily - At last visit, vitamin D  level was normal, at 31 - We will recheck this today - Will see her  back in a year  Orders Placed This Encounter  Procedures   TSH   T4, free   T3, free   Thyroid  peroxidase antibody   Thyroglobulin antibody   VITAMIN D  25 Hydroxy (Vit-D Deficiency, Fractures)   Component     Latest Ref Rng 07/05/2024  Triiodothyronine,Free,Serum     2.3 - 4.2 pg/mL 3.7   TSH     0.40 - 4.50 mIU/L 1.24   T4,Free(Direct)     0.8 - 1.8 ng/dL 1.4   Vitamin D , 25-Hydroxy     30 - 100 ng/mL 48   Vitamin D  and TFTs are normal.  Antibodies are still pending.  Component     Latest Ref Rng 07/05/2024  Thyroglobulin Ab     < or = 1 IU/mL 5 (H)   Thyroperoxidase Ab SerPl-aCnc     <9 IU/mL 1   TPO antibodies are normal, but ATA antibodies are slightly higher.  Lela Fendt, MD PhD [facility] Endocrinology

## 2024-07-06 ENCOUNTER — Ambulatory Visit: Payer: Self-pay | Admitting: Internal Medicine

## 2024-07-07 LAB — THYROGLOBULIN ANTIBODY: Thyroglobulin Ab: 5 [IU]/mL — ABNORMAL HIGH

## 2024-07-07 LAB — THYROID PEROXIDASE ANTIBODY: Thyroperoxidase Ab SerPl-aCnc: 1 [IU]/mL

## 2024-07-07 LAB — T3, FREE: T3, Free: 3.7 pg/mL (ref 2.3–4.2)

## 2024-07-07 LAB — VITAMIN D 25 HYDROXY (VIT D DEFICIENCY, FRACTURES): Vit D, 25-Hydroxy: 48 ng/mL (ref 30–100)

## 2024-07-07 LAB — T4, FREE: Free T4: 1.4 ng/dL (ref 0.8–1.8)

## 2024-07-07 LAB — TSH: TSH: 1.24 m[IU]/L (ref 0.40–4.50)

## 2024-08-07 ENCOUNTER — Other Ambulatory Visit (HOSPITAL_COMMUNITY): Payer: Self-pay

## 2024-08-12 ENCOUNTER — Other Ambulatory Visit (HOSPITAL_COMMUNITY): Payer: Self-pay

## 2024-08-18 ENCOUNTER — Other Ambulatory Visit (HOSPITAL_COMMUNITY): Payer: Self-pay

## 2024-08-18 ENCOUNTER — Other Ambulatory Visit: Payer: Self-pay | Admitting: Gastroenterology

## 2024-08-18 MED ORDER — OMEPRAZOLE 40 MG PO CPDR: 40.0000 mg | DELAYED_RELEASE_CAPSULE | Freq: Every day | ORAL | 1 refills | Status: AC

## 2024-08-22 ENCOUNTER — Encounter (HOSPITAL_COMMUNITY): Payer: Self-pay | Admitting: Pharmacist

## 2024-08-22 ENCOUNTER — Other Ambulatory Visit (HOSPITAL_COMMUNITY): Payer: Self-pay

## 2024-08-29 ENCOUNTER — Other Ambulatory Visit (HOSPITAL_COMMUNITY): Payer: Self-pay

## 2025-07-06 ENCOUNTER — Ambulatory Visit: Admitting: Internal Medicine
